# Patient Record
Sex: Male | Born: 1935
Health system: Southern US, Community
[De-identification: ages and names within clinical notes are randomized; demographics above are authoritative.]

## PROBLEM LIST (undated history)

## (undated) DIAGNOSIS — I10 Essential (primary) hypertension: Secondary | ICD-10-CM

## (undated) DIAGNOSIS — T8859XA Other complications of anesthesia, initial encounter: Secondary | ICD-10-CM

## (undated) DIAGNOSIS — E559 Vitamin D deficiency, unspecified: Secondary | ICD-10-CM

## (undated) DIAGNOSIS — M5136 Other intervertebral disc degeneration, lumbar region: Secondary | ICD-10-CM

## (undated) DIAGNOSIS — M65352 Trigger finger, left little finger: Secondary | ICD-10-CM

## (undated) DIAGNOSIS — I219 Acute myocardial infarction, unspecified: Secondary | ICD-10-CM

## (undated) DIAGNOSIS — M810 Age-related osteoporosis without current pathological fracture: Secondary | ICD-10-CM

## (undated) DIAGNOSIS — T7840XA Allergy, unspecified, initial encounter: Secondary | ICD-10-CM

## (undated) DIAGNOSIS — T4145XA Adverse effect of unspecified anesthetic, initial encounter: Secondary | ICD-10-CM

## (undated) DIAGNOSIS — M1990 Unspecified osteoarthritis, unspecified site: Secondary | ICD-10-CM

## (undated) DIAGNOSIS — E785 Hyperlipidemia, unspecified: Secondary | ICD-10-CM

## (undated) DIAGNOSIS — M51369 Other intervertebral disc degeneration, lumbar region without mention of lumbar back pain or lower extremity pain: Secondary | ICD-10-CM

## (undated) DIAGNOSIS — I2510 Atherosclerotic heart disease of native coronary artery without angina pectoris: Secondary | ICD-10-CM

## (undated) DIAGNOSIS — C801 Malignant (primary) neoplasm, unspecified: Secondary | ICD-10-CM

## (undated) DIAGNOSIS — R0600 Dyspnea, unspecified: Secondary | ICD-10-CM

## (undated) HISTORY — PX: TONGUE SURGERY: SHX810

## (undated) HISTORY — DX: Unspecified osteoarthritis, unspecified site: M19.90

## (undated) HISTORY — PX: COLONOSCOPY W/ POLYPECTOMY: SHX1380

## (undated) HISTORY — DX: Other intervertebral disc degeneration, lumbar region without mention of lumbar back pain or lower extremity pain: M51.369

## (undated) HISTORY — PX: CARDIAC CATHETERIZATION: SHX172

## (undated) HISTORY — DX: Vitamin D deficiency, unspecified: E55.9

## (undated) HISTORY — PX: CHOLECYSTECTOMY: SHX55

## (undated) HISTORY — PX: SKIN CANCER EXCISION: SHX779

## (undated) HISTORY — PX: OTHER SURGICAL HISTORY: SHX169

## (undated) HISTORY — PX: BICEPS TENDON REPAIR: SHX566

## (undated) HISTORY — PX: TONSILLECTOMY: SUR1361

## (undated) HISTORY — DX: Age-related osteoporosis without current pathological fracture: M81.0

## (undated) HISTORY — PX: LEG SURGERY: SHX1003

## (undated) HISTORY — PX: EYE SURGERY: SHX253

## (undated) HISTORY — PX: ROTATOR CUFF REPAIR: SHX139

## (undated) HISTORY — DX: Trigger finger, left little finger: M65.352

## (undated) HISTORY — DX: Other intervertebral disc degeneration, lumbar region: M51.36

## (undated) HISTORY — DX: Allergy, unspecified, initial encounter: T78.40XA

---

## 1982-02-27 DIAGNOSIS — I219 Acute myocardial infarction, unspecified: Secondary | ICD-10-CM

## 1982-02-27 HISTORY — DX: Acute myocardial infarction, unspecified: I21.9

## 1982-02-27 HISTORY — PX: CORONARY ANGIOPLASTY: SHX604

## 1997-02-27 HISTORY — PX: JOINT REPLACEMENT: SHX530

## 2003-04-23 ENCOUNTER — Encounter: Admission: RE | Admit: 2003-04-23 | Discharge: 2003-04-23 | Payer: Self-pay | Admitting: Orthopedic Surgery

## 2003-12-18 ENCOUNTER — Encounter: Admission: RE | Admit: 2003-12-18 | Discharge: 2003-12-18 | Payer: Self-pay | Admitting: Orthopedic Surgery

## 2003-12-22 ENCOUNTER — Ambulatory Visit (HOSPITAL_COMMUNITY): Admission: RE | Admit: 2003-12-22 | Discharge: 2003-12-22 | Payer: Self-pay | Admitting: Orthopedic Surgery

## 2003-12-22 ENCOUNTER — Ambulatory Visit (HOSPITAL_BASED_OUTPATIENT_CLINIC_OR_DEPARTMENT_OTHER): Admission: RE | Admit: 2003-12-22 | Discharge: 2003-12-22 | Payer: Self-pay | Admitting: Orthopedic Surgery

## 2009-11-17 ENCOUNTER — Ambulatory Visit (HOSPITAL_COMMUNITY): Admission: RE | Admit: 2009-11-17 | Discharge: 2009-11-18 | Payer: Self-pay | Admitting: Orthopedic Surgery

## 2010-05-12 LAB — DIFFERENTIAL
Basophils Absolute: 0 10*3/uL (ref 0.0–0.1)
Basophils Relative: 0 % (ref 0–1)
Eosinophils Absolute: 0.2 10*3/uL (ref 0.0–0.7)
Eosinophils Relative: 4 % (ref 0–5)
Lymphocytes Relative: 25 % (ref 12–46)
Lymphs Abs: 1.5 10*3/uL (ref 0.7–4.0)
Monocytes Absolute: 0.6 10*3/uL (ref 0.1–1.0)
Monocytes Relative: 11 % (ref 3–12)
Neutro Abs: 3.5 10*3/uL (ref 1.7–7.7)
Neutrophils Relative %: 60 % (ref 43–77)

## 2010-05-12 LAB — APTT: aPTT: 27 seconds (ref 24–37)

## 2010-05-12 LAB — CBC
HCT: 41.1 % (ref 39.0–52.0)
Hemoglobin: 14.5 g/dL (ref 13.0–17.0)
MCH: 33.1 pg (ref 26.0–34.0)
MCHC: 35.2 g/dL (ref 30.0–36.0)
MCV: 94 fL (ref 78.0–100.0)
Platelets: 170 10*3/uL (ref 150–400)
RBC: 4.38 MIL/uL (ref 4.22–5.81)
RDW: 12.7 % (ref 11.5–15.5)
WBC: 5.8 10*3/uL (ref 4.0–10.5)

## 2010-05-12 LAB — URINALYSIS, ROUTINE W REFLEX MICROSCOPIC
Bilirubin Urine: NEGATIVE
Glucose, UA: NEGATIVE mg/dL
Hgb urine dipstick: NEGATIVE
Ketones, ur: NEGATIVE mg/dL
Nitrite: NEGATIVE
Protein, ur: NEGATIVE mg/dL
Specific Gravity, Urine: 1.021 (ref 1.005–1.030)
Urobilinogen, UA: 1 mg/dL (ref 0.0–1.0)
pH: 5.5 (ref 5.0–8.0)

## 2010-05-12 LAB — COMPREHENSIVE METABOLIC PANEL
ALT: 24 U/L (ref 0–53)
AST: 28 U/L (ref 0–37)
Albumin: 3.9 g/dL (ref 3.5–5.2)
Alkaline Phosphatase: 59 U/L (ref 39–117)
BUN: 17 mg/dL (ref 6–23)
CO2: 29 mEq/L (ref 19–32)
Calcium: 9.4 mg/dL (ref 8.4–10.5)
Chloride: 107 mEq/L (ref 96–112)
Creatinine, Ser: 1.03 mg/dL (ref 0.4–1.5)
GFR calc Af Amer: >60 mL/min (ref >60)
GFR calc non Af Amer: >60 mL/min (ref >60)
Glucose, Bld: 101 mg/dL — ABNORMAL HIGH (ref 70–99)
Potassium: 4.8 mEq/L (ref 3.5–5.1)
Sodium: 142 mEq/L (ref 135–145)
Total Bilirubin: 1 mg/dL (ref 0.3–1.2)
Total Protein: 6.9 g/dL (ref 6.0–8.3)

## 2010-05-12 LAB — SURGICAL PCR SCREEN
MRSA, PCR: NEGATIVE
Staphylococcus aureus: NEGATIVE

## 2010-05-12 LAB — PROTIME-INR
INR: 0.94 (ref 0.00–1.49)
Prothrombin Time: 12.8 seconds (ref 11.6–15.2)

## 2010-07-15 NOTE — Op Note (Signed)
NAME:  Ricky Lee, Ricky Lee NO.:  [id number]   MEDICAL RECORD NO.:  [id number]          PATIENT TYPE:  AMB   LOCATION:  DSC                          FACILITY:  [facility]   PHYSICIAN:  Leonides Grills, M.D.     DATE OF BIRTH:  [birth year]   DATE OF PROCEDURE:  12/22/2003  DATE OF DISCHARGE:                                 OPERATIVE REPORT   PREOPERATIVE DIAGNOSES:  1.  Right talonavicular joint arthritis.  2.  Right subtalar joint arthritis.  3.  Right tight gastroc.  4.  Right os trigonum.  5.  Right posterior ankle impingement.  6.  Right tight peroneus brevis tendon.   POSTOPERATIVE DIAGNOSES:  1.  Right talonavicular joint arthritis.  2.  Right subtalar joint arthritis.  3.  Right tight gastroc.  4.  Right os trigonum.  5.  Right posterior ankle impingement.  6.  Right tight peroneus brevis tendon.   OPERATION PERFORMED:  1.  Right talonavicular joint fusion.  2.  Stress x-rays right foot.  3.  Right subtalar joint fusion.  4.  Right local bone graft.  5.  Right gastroc slide.  6.  Right partial excision, talus.  7.  Right posterior ankle arthrotomy with local tenosynovectomy.  8.  Right peroneus brevis tenotomy.   SURGEON:  Leonides Grills, M.D.   ASSISTANT:  Lianne Cure, P.A.   ANESTHESIA:  General endotracheal tube.   ESTIMATED BLOOD LOSS:  Minimal.   TOURNIQUET TIME:  Two hours.   COMPLICATIONS:  None.   DISPOSITION:  Stable to PR.   INDICATIONS FOR PROCEDURE:  The patient is a 77 year old male who has had  longstanding hind foot pain that was interfering with his life to the point  where he cannot do what he wants to do.  The patient  has consented for the  above procedure.  All risks which include infection, neurovascular injury,  nonunion,  malunion, hardware irritation, hardware failure, persistent pain,  worsening pain, stiffness, arthritis of the neighboring joints with possible  future fusion were all explained, questions were encouraged  and answered.   DESCRIPTION OF PROCEDURE:  The patient was brought to the operating room and  placed in supine position after adequate general endotracheal tube  anesthesia was administered with popliteal block as well as Ancef 1 g IV  piggy back.  The patient was then placed in a sloppy lateral position with  the operative side up on a bean bag.  All bony prominences were well padded.  The right lower extremity was prepped and draped in sterile manner over a  proximally placed thigh tourniquet.  The limb was gravity exsanguinated.  The tourniquet was elevated 290 mmHg.  Longitudinal incision midline between  the posterior aspect of the peroneal tendons as well as the anterior aspect  of the Achilles tendon was then made, dissection was carried down through  skin and hemostasis was obtained.  Sural nerve was then identified and a  formal sural nerve neurolysis was then performed.  Soft tissue was then  retracted out of harm's way, posterior capsulotomy was then performed.  There was  a large os trigonum posteriorly which was then removed with the  rongeur as well as the curved quarter inch osteotome.  Stress x-rays were  obtained in the lateral view for his plantar flexion show that this was  adequately decompressed and there was no impinging areas.  This was palpated  as well.  The wound was then copiously irrigated with normal saline.  Subcu  was closed with 3-0 Vicryl.  The skin was closed with 4-0 nylon.  An Ollier  incision was then made curvilinear over the sinus tarsi area.  Dissection  was carried down through skin.  Hemostasis was obtained.  The extensor  digitorum brevis was then removed in a U-shaped manner with a scalpel and  the subtalar joint in the lateral portion of talonavicular joint was then  entered.  The remaining cartilage was then removed with a curved 1/4 inch  osteotome, curette, as well as a synovectomy rongeur.  Multiple 2 mm drill  holes were placed on either  side of both joints as well.  We then corrected  the AP talar calcaneal angle by distracting through the lateral column in  the angle of Glissane with a medium-sized lamina spreader. This had not only  excellent correction of the lateral column shortening but also reduced the  talonavicular joint as well as reproduced his arch as well.  This was then  provisionally fixed with a 2.0 mm K-wire.  Through a stab incision plantarly  a K-wire was then placed.  However, prior to this we felt that the peroneus  brevis tendon was severely tight when doing this maneuver and we were able  to correct it further by doing a formal peroneus brevis tenotomy.  We  extended the dissection, exposed the peroneus brevis tendon and then  performed a formal tenotomy of the peroneus brevis tendon.  This had  excellent release of the right peroneus brevis tendon and we were able to  reduce the deformity even further.  We left the peroneus longus to help  plantar flex the first ray.  We then with the joint held in reduced  position, placed a K-wire across the subtalar joint and this held it in a  beautiful position.  Through a stab wound plantarly at the glabral skin  border 4.5, 3.2 mm drill  holes were then placed in the calcaneus and talus  respectively and 85 mm long 16 mm threaded 6.5 mm cancellous screw was then  placed.  This had excellent compression across the subtalar joint.  The  local bone graft was obtained through spur removal as well as the os  trigonum was then rongeured up into small fragments.  We then copiously  irrigated the area with normal saline and then placed local bone graft using  a bur stretch strain relieving type in the anterior aspect of the subtalar  joint in the sinus tarsi area as well as a portion of it went into the  lateral aspect of the talonavicular joint as well.  We then closed the  posterolateral approach to the posterior aspect of the ankle with 3-0 Vicryl an 4-0 nylon and  then closed the Ollier approach after it was copiously  irrigated with normal saline with 2-0 Vicryl to repair the extensor  digitorum brevis origin and then finally close the subcu with 3-0 Vicryl and  the skin with 4-0 nylon.  We then deflated the bean bag, externally rotated  the leg and then made a longitudinal incision over the medial head  of the  gastrocnemius, dissection was carried down through skin and hemostasis  obtained.  Fascia was opened in line with the incision. Conjoined region was  then developed between the gastroc and soleus.  Soft tissue was then  elevated off the posterior aspect of the gastrocnemius identifying the sural  nerve and protecting this posteriorly.  The gastroc then released with the  curved Mayo scissors.  This had excellent release of the tight gastroc.  The  wound was copiously irrigated with normal saline.  Subcutaneous was closed  with 3-0 Vicryl.  The skin was closed with 4-0 Monocryl subcuticular stitch.  A longitudinal incision was made over the dorsal aspect of the posterior  tibial tendon.  Dissection was carried down through skin and hemostasis was  obtained.  A capsulotomy was then performed involving the talonavicular  joint.  The joint was then entered.  The remaining cartilage was then  removed with a curved 1/4 inch osteotome, curette as well as synovectomy  rongeur.  We then plantar flexed the first ray and held this in a reduced  position with a two point reduction clamp.  We then placed a 6.5 mm 32 mm  threaded 50 mm long cancellous screw using a 4.5  3.2 mm drill holes  respectively.  This had excellent compression and maintenance of the  correction.  Clinically, the foot was in five degrees of hind foot valgus.  The first ray was plantar flexed adequately and this was palpated as well.  The area was copiously irrigated with normal saline. The tourniquet was  deflated, hemostasis was obtained.  The dorsal aspect of the posterior  tibial  tendon as well as capsule was closed with 2-0 FiberWire.  Subcu was  closed with 3-0 Vicryl.  Skin was closed with 4-0 nylon.  Final x-rays  obtained, AP ankle, lateral view of the hind foot and AP view of the hind  foot were obtained and showed excellent placement of fixation, adequate  correction of deformity and no gross motion with range of motion of the  ankle as well, stressing the ankle with stress x-rays.  Jones dressing was  applied with ankle in neutral dorsiflexion.  Cam walker boot was applied.  The patient was stable to the PR.       PB/MEDQ  D:  12/22/2003  T:  12/22/2003  Job:  [id number]

## 2011-02-09 DIAGNOSIS — C022 Malignant neoplasm of ventral surface of tongue: Secondary | ICD-10-CM | POA: Insufficient documentation

## 2012-01-08 ENCOUNTER — Encounter (HOSPITAL_COMMUNITY): Payer: Self-pay | Admitting: Pharmacy Technician

## 2012-01-09 NOTE — H&P (Signed)
CC: right shoulder pain HPI: 78   Y/o m    With worsening right shoulder pain secondary to osteoarthritis and rotator cuff arthropathy. Patient has elected to have a reverse total shoulder arthroplasty to decrease pain and increase function. PMH: Hx of MI in 58, hypertension, hyperlipidemia, skin cancer, rheumatoid Family History:coronary heart disease Social: retired, no ETOH, non smoker Meds: metoprolol, lisinopril, simvastatin, aspirin  Allergies: NKDA ROS: Pain with rom of right shoulder Vitals: 122/82, 14, 70 PE: Alert and appropriate    In no acute distress Cranial 2-12 grossly intact Cervical spine full rom without pain Lungs: bilateral breath sounds with no wheeze rhonchi or rales Heart: regular rate and rhythm with no murmur Abd: nontender nondistended with active bowel sounds Ext: right shoulder: FF 90 ER 10 IR 20 nv intact distally Skin: no rashes X-rays: right shoulder end stage osteoarthritis with rotator cuff tearing A/P: right shoulder rotator cuff arthropathy Plan for total shoulder arthroplasty reverse to decrease pain and increase function.

## 2012-01-10 NOTE — Pre-Procedure Instructions (Signed)
22 Ricky Lee  01/10/2012   Your procedure is scheduled on:  Friday, January 19, 2012  Report to [facility] at 5:30 AM.  Call this number if you have problems the morning of surgery: [phone]   Remember:   Do not eat food or drink :After Midnight.      Take these medicines the morning of surgery with A SIP OF WATER: metoprolol    Do not wear jewelry, make-up or nail polish.  Do not wear lotions, powders, or perfumes.   Do not shave 48 hours prior to surgery. Men may shave face and neck.  Do not bring valuables to the hospital.  Contacts, dentures or bridgework may not be worn into surgery.  Leave suitcase in the car. After surgery it may be brought to your room.  For patients admitted to the hospital, checkout time is 11:00 AM the day of discharge.   Patients discharged the day of surgery will not be allowed to drive home.  Name and phone number of your driver: family / friend  Special Instructions: Shower using CHG 2 nights before surgery and the night before surgery.  If you shower the day of surgery use CHG.  Use special wash - you have one bottle of CHG for all showers.  You should use approximately 1/3 of the bottle for each shower.   Please read over the following fact sheets that you were given: Pain Booklet, Coughing and Deep Breathing, MRSA Information and Surgical Site Infection Prevention

## 2012-01-11 ENCOUNTER — Encounter (HOSPITAL_COMMUNITY)
Admission: RE | Admit: 2012-01-11 | Discharge: 2012-01-11 | Disposition: A | Discharge status: Home / Self Care | Payer: Medicare Other | Source: Ambulatory Visit | Attending: Anesthesiology | Admitting: Anesthesiology

## 2012-01-11 ENCOUNTER — Encounter (HOSPITAL_COMMUNITY)
Admission: RE | Admit: 2012-01-11 | Discharge: 2012-01-11 | Disposition: A | Discharge status: Home / Self Care | Payer: Medicare Other | Source: Ambulatory Visit | Attending: Orthopedic Surgery | Admitting: Orthopedic Surgery

## 2012-01-11 ENCOUNTER — Encounter (HOSPITAL_COMMUNITY): Payer: Self-pay

## 2012-01-11 ENCOUNTER — Encounter (HOSPITAL_COMMUNITY): Admission: RE | Admit: 2012-01-11 | Payer: Medicare Other | Source: Ambulatory Visit

## 2012-01-11 HISTORY — DX: Acute myocardial infarction, unspecified: I21.9

## 2012-01-11 HISTORY — DX: Essential (primary) hypertension: I10

## 2012-01-11 HISTORY — DX: Malignant (primary) neoplasm, unspecified: C80.1

## 2012-01-11 HISTORY — DX: Unspecified osteoarthritis, unspecified site: M19.90

## 2012-01-11 HISTORY — DX: Hyperlipidemia, unspecified: E78.5

## 2012-01-11 LAB — BASIC METABOLIC PANEL
BUN: 18 mg/dL (ref 6–23)
Chloride: 103 mEq/L (ref 96–112)
GFR calc Af Amer: >90 mL/min (ref >90)
GFR calc non Af Amer: 79 mL/min — ABNORMAL LOW (ref >90)
Potassium: 4.6 mEq/L (ref 3.5–5.1)
Sodium: 141 mEq/L (ref 135–145)

## 2012-01-11 LAB — SURGICAL PCR SCREEN
MRSA, PCR: NEGATIVE
Staphylococcus aureus: NEGATIVE

## 2012-01-11 LAB — CBC
HCT: 45.5 % (ref 39.0–52.0)
Hemoglobin: 15.5 g/dL (ref 13.0–17.0)
MCHC: 34.1 g/dL (ref 30.0–36.0)
RDW: 12.9 % (ref 11.5–15.5)
WBC: 8.1 10*3/uL (ref 4.0–10.5)

## 2012-01-11 LAB — TYPE AND SCREEN
ABO/RH(D): A POS
Antibody Screen: NEGATIVE

## 2012-01-11 NOTE — Progress Notes (Signed)
Primary Physician - Dr. Samuel Germany; [place] Cardiologist - Dr. Laruth Bouchard - [place] EKG, Echo - 2 weeks ago  Stress test within last 5 years - will request records

## 2012-01-11 NOTE — Progress Notes (Signed)
01/11/12 0843  OBSTRUCTIVE SLEEP APNEA  Have you ever been diagnosed with sleep apnea through a sleep study? No (having sleep study 02/06/12)  Do you snore loudly (loud enough to be heard through closed doors)?  0  Do you often feel tired, fatigued, or sleepy during the daytime? 0  Has anyone observed you stop breathing during your sleep? 0  Do you have, or are you being treated for high blood pressure? 1  BMI more than 35 kg/m2? 1  Age over 76 years old? 1  Neck circumference greater than 40 cm/18 inches? 0  Gender: 1  Obstructive Sleep Apnea Score 4   Score 4 or greater  Results sent to PCP

## 2012-01-18 MED ORDER — DEXTROSE 5 % IV SOLN
3.0000 g | INTRAVENOUS | Status: DC
  Filled 2012-01-18: qty 3000

## 2012-01-18 NOTE — Consult Note (Signed)
Anesthesia Chart Review:  78 year old male scheduled for right reverse total should arthroplasty by Dr. Ranell Patrick.  History includes former smoker, HTN, HLD, OSA, MI, oral and skin cancer.  CXR on 01/11/12 showed low volumes and bibasilar atelectasis.  Preoperative labs noted.  Cardiologist Dr. Gypsy Balsam who cleared patient for this procedure with "mild" risk.  EKG on 12/06/11 showed NSR, LAD, borderline r wave progression in anterior leads.  Echo on 12/14/11 showed normal LVEF 62%, trace MR/TR.  Anticipate he can proceed if no significant change in his status.  Shonna Chock, PA-C

## 2012-01-19 ENCOUNTER — Inpatient Hospital Stay (HOSPITAL_COMMUNITY)
Admission: RE | Admit: 2012-01-19 | Discharge: 2012-01-20 | DRG: 484 | Disposition: A | Discharge status: Home / Self Care | Payer: Medicare Other | Source: Ambulatory Visit | Attending: Orthopedic Surgery | Admitting: Orthopedic Surgery

## 2012-01-19 ENCOUNTER — Inpatient Hospital Stay (HOSPITAL_COMMUNITY): Payer: Medicare Other

## 2012-01-19 ENCOUNTER — Encounter (HOSPITAL_COMMUNITY): Payer: Self-pay | Admitting: Vascular Surgery

## 2012-01-19 ENCOUNTER — Encounter (HOSPITAL_COMMUNITY)
Admission: RE | Disposition: A | Discharge status: Home / Self Care | Payer: Self-pay | Source: Ambulatory Visit | Attending: Orthopedic Surgery

## 2012-01-19 ENCOUNTER — Inpatient Hospital Stay (HOSPITAL_COMMUNITY): Payer: Medicare Other | Admitting: Vascular Surgery

## 2012-01-19 ENCOUNTER — Encounter (HOSPITAL_COMMUNITY): Payer: Self-pay | Admitting: *Deleted

## 2012-01-19 DIAGNOSIS — Z79899 Other long term (current) drug therapy: Secondary | ICD-10-CM

## 2012-01-19 DIAGNOSIS — Z01812 Encounter for preprocedural laboratory examination: Secondary | ICD-10-CM

## 2012-01-19 DIAGNOSIS — M19019 Primary osteoarthritis, unspecified shoulder: Principal | ICD-10-CM | POA: Diagnosis present

## 2012-01-19 DIAGNOSIS — Z85828 Personal history of other malignant neoplasm of skin: Secondary | ICD-10-CM

## 2012-01-19 DIAGNOSIS — G4730 Sleep apnea, unspecified: Secondary | ICD-10-CM | POA: Diagnosis present

## 2012-01-19 DIAGNOSIS — M67919 Unspecified disorder of synovium and tendon, unspecified shoulder: Secondary | ICD-10-CM | POA: Diagnosis present

## 2012-01-19 DIAGNOSIS — I252 Old myocardial infarction: Secondary | ICD-10-CM

## 2012-01-19 DIAGNOSIS — Z7982 Long term (current) use of aspirin: Secondary | ICD-10-CM

## 2012-01-19 DIAGNOSIS — I10 Essential (primary) hypertension: Secondary | ICD-10-CM | POA: Diagnosis present

## 2012-01-19 DIAGNOSIS — M719 Bursopathy, unspecified: Secondary | ICD-10-CM | POA: Diagnosis present

## 2012-01-19 DIAGNOSIS — E785 Hyperlipidemia, unspecified: Secondary | ICD-10-CM | POA: Diagnosis present

## 2012-01-19 DIAGNOSIS — Z01818 Encounter for other preprocedural examination: Secondary | ICD-10-CM

## 2012-01-19 HISTORY — PX: TOTAL SHOULDER ARTHROPLASTY: SHX126

## 2012-01-19 SURGERY — ARTHROPLASTY, SHOULDER, TOTAL
Anesthesia: Regional | Site: Shoulder | Laterality: Right | Wound class: Clean

## 2012-01-19 MED ORDER — SIMVASTATIN 40 MG PO TABS
40.0000 mg | ORAL_TABLET | Freq: Every day | ORAL | Status: DC
  Administered 2012-01-19: 40 mg via ORAL
  Filled 2012-01-19 (×2): qty 1

## 2012-01-19 MED ORDER — ACETAMINOPHEN 10 MG/ML IV SOLN
INTRAVENOUS | Status: AC
  Filled 2012-01-19: qty 100

## 2012-01-19 MED ORDER — METHOCARBAMOL 100 MG/ML IJ SOLN
500.0000 mg | INTRAVENOUS | Status: AC
  Administered 2012-01-19: 500 mg via INTRAVENOUS
  Filled 2012-01-19: qty 5

## 2012-01-19 MED ORDER — BUPIVACAINE-EPINEPHRINE PF 0.25-1:200000 % IJ SOLN
INTRAMUSCULAR | Status: AC
  Filled 2012-01-19: qty 30

## 2012-01-19 MED ORDER — PHENOL 1.4 % MT LIQD: 1.0000 | OROMUCOSAL | Status: DC | PRN

## 2012-01-19 MED ORDER — ACETAMINOPHEN 650 MG RE SUPP: 650.0000 mg | Freq: Four times a day (QID) | RECTAL | Status: DC | PRN

## 2012-01-19 MED ORDER — ACETAMINOPHEN 10 MG/ML IV SOLN: 1000.0000 mg | Freq: Once | INTRAVENOUS | Status: DC | PRN

## 2012-01-19 MED ORDER — CHLORHEXIDINE GLUCONATE 4 % EX LIQD: 60.0000 mL | Freq: Once | CUTANEOUS | Status: DC

## 2012-01-19 MED ORDER — ONDANSETRON HCL 4 MG/2ML IJ SOLN: 4.0000 mg | Freq: Once | INTRAMUSCULAR | Status: DC | PRN

## 2012-01-19 MED ORDER — ADULT MULTIVITAMIN W/MINERALS CH
1.0000 | ORAL_TABLET | Freq: Every day | ORAL | Status: DC
  Administered 2012-01-19: 1 via ORAL
  Filled 2012-01-19 (×2): qty 1

## 2012-01-19 MED ORDER — LIDOCAINE HCL (CARDIAC) 20 MG/ML IV SOLN
INTRAVENOUS | Status: DC | PRN
  Administered 2012-01-19: 30 mg via INTRAVENOUS

## 2012-01-19 MED ORDER — ROCURONIUM BROMIDE 100 MG/10ML IV SOLN
INTRAVENOUS | Status: DC | PRN
  Administered 2012-01-19: 50 mg via INTRAVENOUS

## 2012-01-19 MED ORDER — ACETAMINOPHEN 325 MG PO TABS: 650.0000 mg | ORAL_TABLET | Freq: Four times a day (QID) | ORAL | Status: DC | PRN

## 2012-01-19 MED ORDER — SODIUM CHLORIDE 0.9 % IR SOLN
Status: DC | PRN
  Administered 2012-01-19: 1000 mL

## 2012-01-19 MED ORDER — ACETAMINOPHEN 10 MG/ML IV SOLN
1000.0000 mg | Freq: Once | INTRAVENOUS | Status: AC
  Administered 2012-01-19: 1000 mg via INTRAVENOUS
  Filled 2012-01-19: qty 100

## 2012-01-19 MED ORDER — EPHEDRINE SULFATE 50 MG/ML IJ SOLN
INTRAMUSCULAR | Status: DC | PRN
  Administered 2012-01-19 (×2): 5 mg via INTRAVENOUS
  Administered 2012-01-19: 10 mg via INTRAVENOUS

## 2012-01-19 MED ORDER — OXYCODONE-ACETAMINOPHEN 5-325 MG PO TABS: 1.0000 | ORAL_TABLET | ORAL | Status: DC | PRN

## 2012-01-19 MED ORDER — METOPROLOL TARTRATE 25 MG PO TABS
25.0000 mg | ORAL_TABLET | Freq: Two times a day (BID) | ORAL | Status: DC
  Administered 2012-01-19: 25 mg via ORAL
  Filled 2012-01-19 (×4): qty 1

## 2012-01-19 MED ORDER — ONDANSETRON HCL 4 MG/2ML IJ SOLN: 4.0000 mg | Freq: Four times a day (QID) | INTRAMUSCULAR | Status: DC | PRN

## 2012-01-19 MED ORDER — LACTATED RINGERS IV SOLN
INTRAVENOUS | Status: DC | PRN
  Administered 2012-01-19 (×2): via INTRAVENOUS

## 2012-01-19 MED ORDER — CEFAZOLIN SODIUM-DEXTROSE 2-3 GM-% IV SOLR
2.0000 g | Freq: Four times a day (QID) | INTRAVENOUS | Status: AC
  Administered 2012-01-19 – 2012-01-20 (×3): 2 g via INTRAVENOUS
  Filled 2012-01-19 (×3): qty 50

## 2012-01-19 MED ORDER — METOCLOPRAMIDE HCL 10 MG PO TABS
5.0000 mg | ORAL_TABLET | Freq: Three times a day (TID) | ORAL | Status: DC | PRN
Start: 2012-01-19 — End: 2012-01-20

## 2012-01-19 MED ORDER — BISACODYL 10 MG RE SUPP: 10.0000 mg | Freq: Every day | RECTAL | Status: DC | PRN

## 2012-01-19 MED ORDER — DEXAMETHASONE SODIUM PHOSPHATE 4 MG/ML IJ SOLN
INTRAMUSCULAR | Status: DC | PRN
  Administered 2012-01-19: 4 mg

## 2012-01-19 MED ORDER — HYDROMORPHONE HCL PF 1 MG/ML IJ SOLN
INTRAMUSCULAR | Status: AC
  Filled 2012-01-19: qty 1

## 2012-01-19 MED ORDER — VITAMIN E 180 MG (400 UNIT) PO CAPS
400.0000 [IU] | ORAL_CAPSULE | Freq: Every day | ORAL | Status: DC
  Administered 2012-01-19: 400 [IU] via ORAL
  Filled 2012-01-19 (×3): qty 1

## 2012-01-19 MED ORDER — ACETAMINOPHEN 10 MG/ML IV SOLN: INTRAVENOUS | Status: DC | PRN

## 2012-01-19 MED ORDER — METHOCARBAMOL 100 MG/ML IJ SOLN
500.0000 mg | Freq: Four times a day (QID) | INTRAVENOUS | Status: DC | PRN
  Filled 2012-01-19: qty 5

## 2012-01-19 MED ORDER — KETOROLAC TROMETHAMINE 30 MG/ML IJ SOLN
INTRAMUSCULAR | Status: DC | PRN
  Administered 2012-01-19: 15 mg via INTRAVENOUS

## 2012-01-19 MED ORDER — OXYCODONE HCL 5 MG PO TABS
5.0000 mg | ORAL_TABLET | ORAL | Status: DC | PRN
  Administered 2012-01-20 (×2): 10 mg via ORAL
  Filled 2012-01-19 (×2): qty 2

## 2012-01-19 MED ORDER — METHOCARBAMOL 500 MG PO TABS
500.0000 mg | ORAL_TABLET | Freq: Four times a day (QID) | ORAL | Status: DC | PRN
  Administered 2012-01-20: 500 mg via ORAL
  Filled 2012-01-19: qty 1

## 2012-01-19 MED ORDER — BUPIVACAINE-EPINEPHRINE 0.25% -1:200000 IJ SOLN
INTRAMUSCULAR | Status: DC | PRN
  Administered 2012-01-19: 7 mL

## 2012-01-19 MED ORDER — COENZYME Q10 30 MG PO CAPS: 30.0000 mg | ORAL_CAPSULE | Freq: Every day | ORAL | Status: DC

## 2012-01-19 MED ORDER — METOCLOPRAMIDE HCL 5 MG/ML IJ SOLN: 5.0000 mg | Freq: Three times a day (TID) | INTRAMUSCULAR | Status: DC | PRN

## 2012-01-19 MED ORDER — METHOCARBAMOL 500 MG PO TABS: 500.0000 mg | ORAL_TABLET | Freq: Three times a day (TID) | ORAL | Status: DC | PRN

## 2012-01-19 MED ORDER — HYDROMORPHONE HCL PF 1 MG/ML IJ SOLN
0.2500 mg | INTRAMUSCULAR | Status: DC | PRN
  Administered 2012-01-19: 0.5 mg via INTRAVENOUS

## 2012-01-19 MED ORDER — BUPIVACAINE-EPINEPHRINE PF 0.5-1:200000 % IJ SOLN
INTRAMUSCULAR | Status: DC | PRN
  Administered 2012-01-19: 25 mL

## 2012-01-19 MED ORDER — MIDAZOLAM HCL 5 MG/5ML IJ SOLN
INTRAMUSCULAR | Status: DC | PRN
  Administered 2012-01-19: 2 mg via INTRAVENOUS

## 2012-01-19 MED ORDER — MENTHOL 3 MG MT LOZG: 1.0000 | LOZENGE | OROMUCOSAL | Status: DC | PRN

## 2012-01-19 MED ORDER — PROPOFOL 10 MG/ML IV BOLUS
INTRAVENOUS | Status: DC | PRN
  Administered 2012-01-19: 170 mg via INTRAVENOUS

## 2012-01-19 MED ORDER — THROMBIN 5000 UNITS EX SOLR
CUTANEOUS | Status: AC
  Filled 2012-01-19: qty 5000

## 2012-01-19 MED ORDER — ONDANSETRON HCL 4 MG/2ML IJ SOLN
INTRAMUSCULAR | Status: DC | PRN
  Administered 2012-01-19: 4 mg via INTRAVENOUS

## 2012-01-19 MED ORDER — POTASSIUM CHLORIDE IN NACL 20-0.9 MEQ/L-% IV SOLN
INTRAVENOUS | Status: DC
  Administered 2012-01-19: 18:00:00 via INTRAVENOUS
  Filled 2012-01-19 (×2): qty 1000

## 2012-01-19 MED ORDER — PHENYLEPHRINE HCL 10 MG/ML IJ SOLN
INTRAMUSCULAR | Status: DC | PRN
  Administered 2012-01-19 (×4): 80 ug via INTRAVENOUS

## 2012-01-19 MED ORDER — FENTANYL CITRATE 0.05 MG/ML IJ SOLN
INTRAMUSCULAR | Status: DC | PRN
  Administered 2012-01-19: 50 ug via INTRAVENOUS
  Administered 2012-01-19: 100 ug via INTRAVENOUS

## 2012-01-19 MED ORDER — OMEGA-3 FATTY ACIDS 1000 MG PO CAPS
2.0000 g | ORAL_CAPSULE | Freq: Two times a day (BID) | ORAL | Status: DC
  Administered 2012-01-19: 1 g via ORAL
  Administered 2012-01-19: 2 g via ORAL
  Filled 2012-01-19 (×4): qty 2

## 2012-01-19 MED ORDER — ONDANSETRON HCL 4 MG PO TABS: 4.0000 mg | ORAL_TABLET | Freq: Four times a day (QID) | ORAL | Status: DC | PRN

## 2012-01-19 MED ORDER — MORPHINE SULFATE 2 MG/ML IJ SOLN
2.0000 mg | INTRAMUSCULAR | Status: DC | PRN
  Administered 2012-01-19 – 2012-01-20 (×2): 2 mg via INTRAVENOUS
  Filled 2012-01-19 (×2): qty 1

## 2012-01-19 MED ORDER — ACETAMINOPHEN 10 MG/ML IV SOLN
1000.0000 mg | Freq: Four times a day (QID) | INTRAVENOUS | Status: DC
  Administered 2012-01-19 – 2012-01-20 (×3): 1000 mg via INTRAVENOUS
  Filled 2012-01-19 (×4): qty 100

## 2012-01-19 MED ORDER — GARLIC 1000 MG PO CAPS: 1.0000 | ORAL_CAPSULE | Freq: Every day | ORAL | Status: DC

## 2012-01-19 MED ORDER — LISINOPRIL 5 MG PO TABS
5.0000 mg | ORAL_TABLET | Freq: Every day | ORAL | Status: DC
  Administered 2012-01-19: 5 mg via ORAL
  Filled 2012-01-19 (×2): qty 1

## 2012-01-19 SURGICAL SUPPLY — 65 items
BLADE SAG 18X100X1.27 (BLADE) ×2 IMPLANT
BLADE SAW SAG 73X25 THK (BLADE)
BLADE SAW SGTL 73X25 THK (BLADE) IMPLANT
BUR SURG 4X8 MED (BURR) IMPLANT
BURR SURG 4X8 MED (BURR)
CLOTH BEACON ORANGE TIMEOUT ST (SAFETY) ×2 IMPLANT
CLSR STERI-STRIP ANTIMIC 1/2X4 (GAUZE/BANDAGES/DRESSINGS) ×2 IMPLANT
COVER SURGICAL LIGHT HANDLE (MISCELLANEOUS) ×2 IMPLANT
DRAPE INCISE IOBAN 66X45 STRL (DRAPES) ×2 IMPLANT
DRAPE U-SHAPE 47X51 STRL (DRAPES) ×2 IMPLANT
DRAPE X-RAY CASS 24X20 (DRAPES) IMPLANT
DRILL BIT 5/64 (BIT) ×2 IMPLANT
DRSG ADAPTIC 3X8 NADH LF (GAUZE/BANDAGES/DRESSINGS) ×2 IMPLANT
DRSG PAD ABDOMINAL 8X10 ST (GAUZE/BANDAGES/DRESSINGS) ×2 IMPLANT
DURAPREP 26ML APPLICATOR (WOUND CARE) ×2 IMPLANT
ELECT BLADE 4.0 EZ CLEAN MEGAD (MISCELLANEOUS) ×2
ELECT NEEDLE TIP 2.8 STRL (NEEDLE) ×2 IMPLANT
ELECT REM PT RETURN 9FT ADLT (ELECTROSURGICAL) ×2
ELECTRODE BLDE 4.0 EZ CLN MEGD (MISCELLANEOUS) ×1 IMPLANT
ELECTRODE REM PT RTRN 9FT ADLT (ELECTROSURGICAL) ×1 IMPLANT
GLOVE BIOGEL PI ORTHO PRO 7.5 (GLOVE) ×1
GLOVE BIOGEL PI ORTHO PRO SZ8 (GLOVE) ×1
GLOVE ORTHO TXT STRL SZ7.5 (GLOVE) ×2 IMPLANT
GLOVE PI ORTHO PRO STRL 7.5 (GLOVE) ×1 IMPLANT
GLOVE PI ORTHO PRO STRL SZ8 (GLOVE) ×1 IMPLANT
GLOVE SURG ORTHO 8.5 STRL (GLOVE) ×4 IMPLANT
GOWN STRL REIN XL XLG (GOWN DISPOSABLE) ×6 IMPLANT
HANDPIECE INTERPULSE COAX TIP (DISPOSABLE)
KIT BASIN OR (CUSTOM PROCEDURE TRAY) ×2 IMPLANT
KIT ROOM TURNOVER OR (KITS) ×2 IMPLANT
MANIFOLD NEPTUNE II (INSTRUMENTS) ×2 IMPLANT
NDL SUT 6 .5 CRC .975X.05 MAYO (NEEDLE) ×1 IMPLANT
NEEDLE 1/2 CIR MAYO (NEEDLE) ×2 IMPLANT
NEEDLE HYPO 25GX1X1/2 BEV (NEEDLE) ×2 IMPLANT
NEEDLE MAYO TAPER (NEEDLE) ×1
NS IRRIG 1000ML POUR BTL (IV SOLUTION) ×2 IMPLANT
PACK SHOULDER (CUSTOM PROCEDURE TRAY) ×2 IMPLANT
PAD ARMBOARD 7.5X6 YLW CONV (MISCELLANEOUS) ×2 IMPLANT
SCREW 48L (Screw) ×2 IMPLANT
SET HNDPC FAN SPRY TIP SCT (DISPOSABLE) IMPLANT
SLING ARM IMMOBILIZER LRG (SOFTGOODS) ×2 IMPLANT
SLING ARM IMMOBILIZER MED (SOFTGOODS) IMPLANT
SMARTMIX MINI TOWER (MISCELLANEOUS) ×2
SPACER 42 PLUS 6 (Orthopedic Implant) ×2 IMPLANT
SPONGE GAUZE 4X4 12PLY (GAUZE/BANDAGES/DRESSINGS) ×2 IMPLANT
SPONGE LAP 18X18 X RAY DECT (DISPOSABLE) ×2 IMPLANT
SPONGE LAP 4X18 X RAY DECT (DISPOSABLE) ×2 IMPLANT
SPONGE SURGIFOAM ABS GEL SZ50 (HEMOSTASIS) IMPLANT
STRIP CLOSURE SKIN 1/2X4 (GAUZE/BANDAGES/DRESSINGS) ×2 IMPLANT
SUCTION FRAZIER TIP 10 FR DISP (SUCTIONS) ×2 IMPLANT
SUT FIBERWIRE #2 38 T-5 BLUE (SUTURE) ×8
SUT MNCRL AB 4-0 PS2 18 (SUTURE) ×2 IMPLANT
SUT VIC AB 0 CT1 27 (SUTURE) ×1
SUT VIC AB 0 CT1 27XBRD ANBCTR (SUTURE) ×1 IMPLANT
SUT VIC AB 2-0 CT1 27 (SUTURE) ×1
SUT VIC AB 2-0 CT1 TAPERPNT 27 (SUTURE) ×1 IMPLANT
SUT VICRYL AB 2 0 TIES (SUTURE) ×2 IMPLANT
SUTURE FIBERWR #2 38 T-5 BLUE (SUTURE) ×4 IMPLANT
SYR CONTROL 10ML LL (SYRINGE) ×2 IMPLANT
TOWEL OR 17X24 6PK STRL BLUE (TOWEL DISPOSABLE) ×2 IMPLANT
TOWEL OR 17X26 10 PK STRL BLUE (TOWEL DISPOSABLE) ×2 IMPLANT
TOWER SMARTMIX MINI (MISCELLANEOUS) ×1 IMPLANT
TRAY FOLEY CATH 14FR (SET/KITS/TRAYS/PACK) ×2 IMPLANT
WATER STERILE IRR 1000ML POUR (IV SOLUTION) ×2 IMPLANT
YANKAUER SUCT BULB TIP NO VENT (SUCTIONS) ×2 IMPLANT

## 2012-01-19 NOTE — Transfer of Care (Signed)
Immediate Anesthesia Transfer of Care Note  Patient: Ricky Lee  Procedure(s) Performed: Procedure(s) (LRB) with comments: TOTAL SHOULDER ARTHROPLASTY (Right) - REVERSE TOTAL SHOULDER  Patient Location: PACU  Anesthesia Type:GA combined with regional for post-op pain  Level of Consciousness: awake, alert , oriented and patient cooperative  Airway & Oxygen Therapy: Patient Spontanous Breathing and Patient connected to face mask oxygen  Post-op Assessment: Report given to PACU RN, Post -op Vital signs reviewed and stable and Patient moving all extremities  Post vital signs: Reviewed and stable  Complications: No apparent anesthesia complications

## 2012-01-19 NOTE — Plan of Care (Signed)
Problem: Consults Goal: Rotator Cuff Repair Patient Education See Patient Education Module for education specifics. Right total Shoulder

## 2012-01-19 NOTE — Progress Notes (Signed)
Report given to kathy rn as caregiver 

## 2012-01-19 NOTE — Anesthesia Preprocedure Evaluation (Addendum)
Anesthesia Evaluation  Patient identified by MRN, date of birth, ID band Patient awake    Reviewed: Allergy & Precautions, H&P , NPO status , Patient's Chart, lab work & pertinent test results, reviewed documented beta blocker date and time   Airway Mallampati: I TM Distance: >3 FB Neck ROM: Full    Dental  (+) Edentulous Upper and Dental Advisory Given   Pulmonary sleep apnea ,  breath sounds clear to auscultation        Cardiovascular hypertension, Pt. on home beta blockers + Past MI Rhythm:Regular Rate:Normal     Neuro/Psych    GI/Hepatic   Endo/Other    Renal/GU      Musculoskeletal   Abdominal   Peds  Hematology   Anesthesia Other Findings   Reproductive/Obstetrics                         Anesthesia Physical Anesthesia Plan  ASA: III  Anesthesia Plan: General   Post-op Pain Management:    Induction: Intravenous  Airway Management Planned: Oral ETT  Additional Equipment:   Intra-op Plan:   Post-operative Plan: Extubation in OR  Informed Consent: I have reviewed the patients History and Physical, chart, labs and discussed the procedure including the risks, benefits and alternatives for the proposed anesthesia with the patient or authorized representative who has indicated his/her understanding and acceptance.   Dental advisory given  Plan Discussed with: CRNA and Surgeon  Anesthesia Plan Comments:         Anesthesia Quick Evaluation

## 2012-01-19 NOTE — Brief Op Note (Signed)
01/19/2012  10:38 AM  PATIENT:  Barbara Cower Triggs  78 y.o. male  PRE-OPERATIVE DIAGNOSIS:  RIGHT SHOULDER END STAGE OA WITH CUFF ARTHROPATHY  POST-OPERATIVE DIAGNOSIS:  RIGHT SHOULDER END STAGE OA WITH CUFF ARTHROPATHY  PROCEDURE:  Procedure(s) (LRB) with comments: TOTAL SHOULDER ARTHROPLASTY (Right) - REVERSE TOTAL SHOULDER, DePuy Delta Xtend  SURGEON:  Surgeon(s) and Role:    * Verlee Rossetti, MD - Primary  PHYSICIAN ASSISTANT:   ASSISTANTS: Thea Gist, PA-C   ANESTHESIA:   regional and general  EBL:  Total I/O In: 1000 [I.V.:1000] Out: -   BLOOD ADMINISTERED:none  DRAINS: none   LOCAL MEDICATIONS USED:  MARCAINE     SPECIMEN:  No Specimen  DISPOSITION OF SPECIMEN:  N/A  COUNTS:  YES  TOURNIQUET:  * No tourniquets in log *  DICTATION: .Other Dictation: Dictation Number [id number]  PLAN OF CARE: Admit to inpatient  PATIENT DISPOSITION:  PACU - hemodynamically stable.   Delay start of Pharmacological VTE agent (>24hrs) due to surgical blood loss or risk of bleeding: not applicable

## 2012-01-19 NOTE — Progress Notes (Signed)
Right shoulder xray done as ordered.

## 2012-01-19 NOTE — Progress Notes (Signed)
Pt refused to wear CPAP. HR was elevated 125 but said that he was just kind of anxious and that it has been a little high since he has been in the hospital. RR was 20. SPO2 was 94% on 2LPM oxygen. Breath sounds were slightly diminished. RT will continue to monitor pt.

## 2012-01-19 NOTE — Progress Notes (Signed)
UR COMPLETED  

## 2012-01-19 NOTE — Anesthesia Postprocedure Evaluation (Signed)
  Anesthesia Post-op Note  Patient: Ricky Lee  Procedure(s) Performed: Procedure(s) (LRB) with comments: TOTAL SHOULDER ARTHROPLASTY (Right) - REVERSE TOTAL SHOULDER  Patient Location: PACU  Anesthesia Type:General and GA combined with regional for post-op pain  Level of Consciousness: awake, alert  and oriented  Airway and Oxygen Therapy: Patient Spontanous Breathing and Patient connected to nasal cannula oxygen  Post-op Pain: mild  Post-op Assessment: Post-op Vital signs reviewed and Patient's Cardiovascular Status Stable  Post-op Vital Signs: stable  Complications: No apparent anesthesia complications

## 2012-01-19 NOTE — Progress Notes (Signed)
Report given to Ricky Lee for lunch break

## 2012-01-19 NOTE — Anesthesia Procedure Notes (Addendum)
Anesthesia Regional Block:  Interscalene brachial plexus block  Pre-Anesthetic Checklist: ,, timeout performed, Correct Patient, Correct Site, Correct Laterality, Correct Procedure, Correct Position, site marked, Risks and benefits discussed,  Surgical consent,  Pre-op evaluation,  At surgeon's request and post-op pain management  Laterality: Right  Prep: chloraprep       Needles:  Injection technique: Single-shot  Needle Type: Echogenic Stimulator Needle     Needle Length: 5cm 5 cm Needle Gauge: 22 and 22 G    Additional Needles:  Procedures: nerve stimulator Interscalene brachial plexus block  Nerve Stimulator or Paresthesia:  Response: 0.48 mA,   Additional Responses:   Narrative:  Start time: 01/19/2012 7:21 AM End time: 01/19/2012 7:30 AM Injection made incrementally with aspirations every 5 mL. Anesthesiologist: Dr Gypsy Balsam  Additional Notes: [id number] R ISB POP CHG prep, sterile tech #22 stim/echo needle w/stim down to .48ma Multiple neg asp Marc .5% w/epi 25cc+ decadron 4mg  infiltrated No compl Dr Gypsy Balsam   Procedure Name: Intubation Date/Time: 01/19/2012 7:46 AM Performed by: Jerilee Hoh Pre-anesthesia Checklist: Patient identified, Emergency Drugs available, Suction available and Patient being monitored Patient Re-evaluated:Patient Re-evaluated prior to inductionOxygen Delivery Method: Circle system utilized Preoxygenation: Pre-oxygenation with 100% oxygen Intubation Type: IV induction Ventilation: Mask ventilation without difficulty and Oral airway inserted - appropriate to patient size Laryngoscope Size: Mac and 4 Grade View: Grade I Tube type: Oral Tube size: 7.5 mm Number of attempts: 1 Airway Equipment and Method: Stylet Placement Confirmation: ETT inserted through vocal cords under direct vision,  positive ETCO2 and breath sounds checked- equal and bilateral Secured at: 21 cm Tube secured with: Tape Dental Injury: Teeth and Oropharynx as  per pre-operative assessment

## 2012-01-19 NOTE — Preoperative (Signed)
Beta Blockers   Reason not to administer Beta Blockers:Not Applicable 

## 2012-01-19 NOTE — Interval H&P Note (Signed)
History and Physical Interval Note:  01/19/2012 7:26 AM  Ricky Lee  has presented today for surgery, with the diagnosis of RIGHT SHOULDER END STAGE OA WITH CUFF ARTHROPATHY  The various methods of treatment have been discussed with the patient and family. After consideration of risks, benefits and other options for treatment, the patient has consented to  Procedure(s) (LRB) with comments: TOTAL SHOULDER ARTHROPLASTY (Right) - REVERSE TOTAL SHOULDER as a surgical intervention .  The patient's history has been reviewed, patient examined, no change in status, stable for surgery.  I have reviewed the patient's chart and labs.  Questions were answered to the patient's satisfaction.     Benny Henrie,STEVEN R

## 2012-01-19 NOTE — Progress Notes (Signed)
Report given from Nonah Mattes

## 2012-01-20 LAB — HEMOGLOBIN AND HEMATOCRIT, BLOOD
HCT: 38.9 % — ABNORMAL LOW (ref 39.0–52.0)
Hemoglobin: 13.2 g/dL (ref 13.0–17.0)

## 2012-01-20 LAB — BASIC METABOLIC PANEL
Calcium: 9 mg/dL (ref 8.4–10.5)
Creatinine, Ser: 0.87 mg/dL (ref 0.50–1.35)
GFR calc Af Amer: >90 mL/min (ref >90)
Sodium: 140 mEq/L (ref 135–145)

## 2012-01-20 NOTE — Evaluation (Signed)
Occupational Therapy Evaluation Patient Details Name: Ricky Lee MRN: [id number] DOB: 1933-10-07 Today's Date: 01/20/2012 Time: 4098-1191 OT Time Calculation (min): 16 min  OT Assessment / Plan / Recommendation Clinical Impression  This 78 y.o. male admitted for reverse TSA.  All education provided.  Pt. and wife demonstrate understanding of all.  Pt ready for discharge    OT Assessment  Patient does not need any further OT services    Follow Up Recommendations  No OT follow up    Barriers to Discharge      Equipment Recommendations  None recommended by OT    Recommendations for Other Services    Frequency       Precautions / Restrictions Precautions Precautions: Shoulder Type of Shoulder Precautions: Dr. Ranell Patrick reverse TSA Precaution Booklet Issued: Yes (comment) Precaution Comments: shoulder handout provided with modifications consistent with Dr. Ranell Patrick protoccol Required Braces or Orthoses: Other Brace/Splint (sling) Restrictions Weight Bearing Restrictions: Yes RUE Weight Bearing: Non weight bearing       ADL  Eating/Feeding: Set up Where Assessed - Eating/Feeding: Chair Grooming: Wash/dry hands;Wash/dry face;Teeth care;Brushing hair;Minimal assistance Where Assessed - Grooming: Supported standing Upper Body Bathing: Minimal assistance Where Assessed - Upper Body Bathing: Supported sitting;Supported standing Lower Body Bathing: Minimal assistance Where Assessed - Lower Body Bathing: Supported sit to stand Upper Body Dressing: Moderate assistance Where Assessed - Upper Body Dressing: Unsupported sitting;Unsupported standing Lower Body Dressing: Moderate assistance Where Assessed - Lower Body Dressing: Supported sit to Pharmacist, hospital: Modified independent Statistician Method: Sit to Barista: Comfort height toilet Toileting - Clothing Manipulation and Hygiene: Minimal assistance Where Assessed - Glass blower/designer  Manipulation and Hygiene: Standing Transfers/Ambulation Related to ADLs: Ambulates independently ADL Comments: Pt. and wife were instructed in safe techniques for ADLs.  He was instructed to wear sling for comfort, and wife able to safely assist with donning/doffing.  Pt. instructed in proper positioning Rt. UE when sleeping, and to incorporate it into ADL activities, but avoid lifting, pulling, pushing with it.  Do not push up with it when moving supine to sit and vise versa, and avoid abduction.  Pt and wife verbalized understanding of all    OT Diagnosis:    OT Problem List:   OT Treatment Interventions:     OT Goals    Visit Information  Last OT Received On: 01/20/12    Subjective Data  Subjective: "What's the difference between a total shoulder and a reverse shoulder replacement?" Patient Stated Goal: To go home   Prior Functioning     Home Living Lives With: Spouse Available Help at Discharge: Family;Available 24 hours/day Type of Home: House Bathroom Shower/Tub: Health visitor: Standard Additional Comments: Wife supportive and able to assist him as needed Prior Function Level of Independence: Independent Able to Take Stairs?: Yes Driving: Yes Vocation: Retired         Data processing manager: Within Hotel manager  Overall Cognitive Status: Appears within functional limits for tasks assessed/performed Arousal/Alertness: Awake/alert Orientation Level: Appears intact for tasks assessed Behavior During Session: [facility] for tasks performed    Extremity/Trunk Assessment Right Upper Extremity Assessment RUE ROM/Strength/Tone: Deficits;Due to precautions (and due to reverse TSA) RUE ROM/Strength/Tone Deficits: demonstrates ~90% Active shoulder flexion Left Upper Extremity Assessment LUE ROM/Strength/Tone: Within functional levels LUE Coordination: WFL - gross/fine motor Trunk Assessment Trunk Assessment: Normal       Mobility Transfers Transfers: Sit to Stand;Stand to Sit Sit to Stand: 7: Independent Stand  to Sit: 7: Independent     Shoulder Instructions Donning/doffing shirt without moving shoulder: Caregiver independent with task;Patient able to independently direct caregiver Method for sponge bathing under operated UE: Caregiver independent with task;Patient able to independently direct caregiver Donning/doffing sling/immobilizer: Caregiver independent with task;Patient able to independently direct caregiver Correct positioning of sling/immobilizer: Caregiver independent with task;Patient able to independently direct caregiver ROM for elbow, wrist and digits of operated UE: Supervision/safety Sling wearing schedule (on at all times/off for ADL's): Independent (sling for comfort) Proper positioning of operated UE when showering: Caregiver independent with task;Patient able to independently direct caregiver;Modified independent Positioning of UE while sleeping: Caregiver independent with task;Independent   Exercise Shoulder Exercises Elbow Flexion: AROM;Right;10 reps;Standing Elbow Extension: AROM;10 reps;Right;Seated Wrist Flexion: Right;AROM;10 reps;Standing Wrist Extension: AROM;Right;10 reps;Seated Neck Flexion: AROM;5 reps;Standing Neck Extension: AROM;10 reps;Standing   Balance     End of Session OT - End of Session Activity Tolerance: Patient tolerated treatment well Patient left: in chair;with call bell/phone within reach;with family/visitor present Nurse Communication: Other (comment) (No further OT needs)  GO     Ajna Moors M 01/20/2012, 2:02 PM

## 2012-01-20 NOTE — Discharge Summary (Signed)
Physician Discharge Summary  Patient ID: Nyshaun Standage Mallette MRN: [id number] DOB/AGE: 03-Oct-1933 78 y.o.  Admit date: 01/19/2012 Discharge date: 01/20/2012  Admission Diagnoses:  Right shoulder arthritis  Discharge Diagnoses:  Same   Surgeries: Procedure(s): TOTAL SHOULDER ARTHROPLASTY on 01/19/2012   Consultants: OT  Discharged Condition: Stable  Hospital Course: JOSEH SJOGREN is an 78 y.o. male who was admitted 01/19/2012 with a chief complaint of shoulder pain, and found to have a diagnosis of shoulder OA.  They were brought to the operating room on 01/19/2012 and underwent the above named procedures.    The patient had an uncomplicated hospital course and was stable for discharge.  Recent vital signs:  Filed Vitals:   01/20/12 0552  BP: 111/65  Pulse: 64  Temp: 98.1 F (36.7 C)  Resp: 16    Recent laboratory studies:  Results for orders placed during the hospital encounter of 01/19/12  HEMOGLOBIN AND HEMATOCRIT, BLOOD      Component Value Range   Hemoglobin 13.2  13.0 - 17.0 g/dL   HCT 40.9 (*) 81.1 - 91.4 %    Discharge Medications:     Medication List     As of 01/20/2012  8:02 AM    TAKE these medications         acetaminophen 325 MG tablet   Commonly known as: TYLENOL   Take 650 mg by mouth every 6 (six) hours as needed.      co-enzyme Q-10 30 MG capsule   Take 30 mg by mouth daily. Hold while in hospital      fish oil-omega-3 fatty acids 1000 MG capsule   Take 2 g by mouth 2 (two) times daily.      Garlic 1000 MG Caps   Take 1 capsule by mouth daily.      lisinopril 5 MG tablet   Commonly known as: PRINIVIL,ZESTRIL   Take 5 mg by mouth daily.      LYCOPENE PO   Take 1 tablet by mouth daily.      methocarbamol 500 MG tablet   Commonly known as: ROBAXIN   Take 1 tablet (500 mg total) by mouth 3 (three) times daily as needed.      metoprolol tartrate 25 MG tablet   Commonly known as: LOPRESSOR   Take 25 mg by mouth 2 (two) times  daily.      multivitamin with minerals Tabs   Take 1 tablet by mouth daily.      oxyCODONE-acetaminophen 5-325 MG per tablet   Commonly known as: PERCOCET/ROXICET   Take 1-2 tablets by mouth every 4 (four) hours as needed for pain.      simvastatin 40 MG tablet   Commonly known as: ZOCOR   Take 40 mg by mouth at bedtime.      vitamin E 400 UNIT capsule   Take 400 Units by mouth daily.        Diagnostic Studies: Dg Chest 2 View  01/11/2012  *RADIOLOGY REPORT*  Clinical Data: Preop  CHEST - 2 VIEW  Comparison: 06/28/2010  Findings: Low volumes.  Bibasilar subsegmental atelectasis.   No consolidation or mass.  Normal heart size.  No pleural effusion. Degenerative changes in the thoracic spine.  IMPRESSION: Low volumes and bibasilar atelectasis.   Original Report Authenticated By: Jolaine Click, M.D.    Dg Shoulder Right Port  01/19/2012  *RADIOLOGY REPORT*  Clinical Data: Shoulder surgery  PORTABLE RIGHT SHOULDER - 2+ VIEW  Comparison: None.  Findings: Right shoulder replacement  in satisfactory position and alignment.  No fracture or acute abnormality  IMPRESSION: Right shoulder replacement.   Original Report Authenticated By: Janeece Riggers, M.D.     Disposition: home with home exercises        Follow-up Information    Follow up with Lauraine Crespo,STEVEN R, MD. Call in 2 weeks. [phone])    Contact information:   [address], [address] [place] [address] [phone]           Signed: Sandro Burgo,STEVEN R 01/20/2012, 8:02 AM

## 2012-01-20 NOTE — Progress Notes (Signed)
D/C instructions reviewed with patient and wife. RX x 2 given. No hh services or equipment needed. Sling in place. dsg changed prior to d/c. All questions answered. Pt d/c'ed via wheelchair in stable condition

## 2012-01-20 NOTE — Op Note (Signed)
NAMEELMAR, ANTIGUA NO.:  [id number]  MEDICAL RECORD NO.:  [id number]  LOCATION:  [id number]                        FACILITY:  [facility]  PHYSICIAN:  Almedia Balls. Ranell Patrick, M.D. DATE OF BIRTH:  03/25/1933  DATE OF PROCEDURE:  01/19/2012 DATE OF DISCHARGE:                              OPERATIVE REPORT   PREOPERATIVE DIAGNOSIS:  Right shoulder end-stage osteoarthritis and rotator cuff insufficiency.  POSTOPERATIVE DIAGNOSIS:  Right shoulder end-stage osteoarthritis and rotator cuff insufficiency.  PROCEDURE PERFORMED:  Right shoulder reverse total shoulder arthroplasty using DePuy Delta Xtend prosthesis.  ATTENDING SURGEON:  Almedia Balls. Ranell Patrick, M.D.  ASSISTANT:  Donnie Coffin. Dixon, PA-C, who scrubbed during the entire procedure and necessary for satisfactory completion of surgery.  ANESTHESIA:  General anesthesia was used plus interscalene block.  ESTIMATED BLOOD LOSS:  Minimal.  ESTIMATED BLOOD LOSS:  100 mL.  FLUID REPLACEMENT:  1500 mL of crystalloid.  INSTRUMENT COUNTS:  Correct.  COMPLICATIONS:  There were no complications.  ANTIBIOTICS:  Perioperative antibiotics were given.  INDICATIONS:  The patient is a 78 year old male with end-stage arthritis of the right shoulder.  The patient had a rotator cuff repair done in [place] 2 years ago.  The patient has had persistent pain since that surgery.  The patient presents now for painful shoulder with very poor function and evidence of rotator cuff atrophy on his MRI and advanced arthritis.  Having failed conservative management, the patient desires reverse total shoulder arthroplasty to provide pain relief and improved function.  Informed consent obtained.  DESCRIPTION OF PROCEDURE:  After an adequate level of anesthesia was achieved, the patient was positioned in the modified beach-chair position.  The right shoulder was correctly identified.  Time-out called.  Sterile prep and drape, the shoulder and arm  performed.  We entered the shoulder using deltopectoral incision, started the anterior coracoid process extending into the anterior humeral shaft.  Dissection was down through the subcutaneous tissues using Bovie.  We identified the cephalic vein and took laterally the deltoid, pectoralis was taken medially.  Conjoined tendon identified and retracted.  Subscapularis released off the lesser tuberosity and tagged for repair.  At the end, we removed the capsule anteriorly, released the capsule off the inferior humeral neck and progressively externally rotated the humerus.  We then released the repaired rotator cuff, which did appear to be intact, but the quality of the tendon was now very good.  We released that all the way around to the back including teres minor.  We then entered the proximal humerus using a 6-mm reamer.  We then reamed up to a size 12 canal diameter and placed our 12 intramedullary resection guide for the proximal humeral resection.  We placed the wound and resected that with that intramedullary guide set on 10 degrees of retroversion, and then did our reaming from the metaphysis with the appropriate epi size 2 reamer so as an epi to right shoulder.  Once we had that metaphysis reamed, we took our implant which was a 12 body or 12 stem with the size 2 metaphyseal implant that was impacted in place.  We then retracted the humerus posteriorly, did a 360-degree capsular removal protecting the axillary nerve to  fully visualize the glenoid face.  We placed our central guidepin, referencing off of multiple things including the coracoid base, the anterior neck of the scapula, also the inferior scapular pillar.  We then went ahead and reamed for metaglene and drilled our central guide hole and then impacted the real metaglene into position.  We placed two lock screws inferiorly and superiorly, 42 inferiorly, 30 at the base of the coracoid, and then 18 anterior and posterior.   Good purchase with those screws.  Excess bone removed.  We then placed a 42 standard glenosphere in place and screwed that into position.  Next, we went ahead and trialed with a +6 trial, 42+ 6 and reduced the shoulder, we were happy with soft tissue balancing, removed the trial components, thoroughly irrigated the humerus, placed couple of drill holes around the bicipital groove.  We tenodesed the biceps.  We then went ahead and placed #2 FiberWire suture through those drill holes to repair subscap.  Next, we went ahead and irrigated and then using impaction grafting technique, we impacted the real hydroxyapatite-coated size 12 stem with the epi to right and set on 10 degrees of retroversion.  Impacted that into place, happy with the purchase and trialed again with +6 initially and thinking +6 was the appropriate size.  We selected that real poly and impacted that into the real humeral insert and then reduced the shoulder.  Upon further trialing, I noted there to be a little bit of gapping with external rotation, and I could dislocate the shoulder just to a little bit too easily, so I went ahead at that point as per the size 9, removed the +6 and took the 42+ 9.  Impacted that in position and I had tightened the shoulder up nicely.  Axillary nerve was not under too much tension.  Conjoint was tensioned appropriately.  We ranged his shoulder fully, no instability. Thoroughly irrigated, closed deltopectoral interval with 0 Vicryl suture followed by 2-0 Vicryl in the subcutaneous closure and 4-0 Monocryl for skin.  Steri-Strips applied followed by sterile dressing.  The patient tolerated the surgery well.     Almedia Balls. Ranell Patrick, M.D.     SRN/MEDQ  D:  01/19/2012  T:  01/20/2012  Job:  [id number]

## 2012-01-20 NOTE — Progress Notes (Signed)
Orthopedics Progress Note  Subjective: I feel better this morning.  Objective:  Filed Vitals:   01/20/12 0552  BP: 111/65  Pulse: 64  Temp: 98.1 F (36.7 C)  Resp: 16    General: Awake and alert  Musculoskeletal: Right shoulder clean and dry and intact, moving the shoulder well with minimal pain. Neurovascularly intact  Lab Results  Component Value Date   WBC 8.1 01/11/2012   HGB 15.5 01/11/2012   HCT 45.5 01/11/2012   MCV 93.8 01/11/2012   PLT 176 01/11/2012       Component Value Date/Time   NA 141 01/11/2012 0823   K 4.6 01/11/2012 0823   CL 103 01/11/2012 0823   CO2 29 01/11/2012 0823   GLUCOSE 104* 01/11/2012 0823   BUN 18 01/11/2012 0823   CREATININE 0.95 01/11/2012 0823   CALCIUM 9.5 01/11/2012 0823   GFRNONAA 79* 01/11/2012 0823   GFRAA >90 01/11/2012 0823    Lab Results  Component Value Date   INR 0.94 11/10/2009    Assessment/Plan: POD #1 s/p Procedure(s): TOTAL SHOULDER ARTHROPLASTY Stable overnight. Plan D/C today after therapy.  Will not need home health PT or OT.  Home exercises only, thanks! Limit WB on the right UE. Almedia Balls. Ranell Patrick, MD 01/20/2012 7:58 AM

## 2012-01-22 ENCOUNTER — Encounter (HOSPITAL_COMMUNITY): Payer: Self-pay | Admitting: Orthopedic Surgery

## 2012-01-24 NOTE — Addendum Note (Signed)
Addendum  created 01/24/12 1027 by Alda Gaultney N Mumm, CRNA   Modules edited:Anesthesia Medication Administration    

## 2012-01-24 NOTE — Addendum Note (Signed)
Addendum  created 01/24/12 1027 by Jerilee Hoh, CRNA   Modules edited:Anesthesia Medication Administration

## 2012-02-08 ENCOUNTER — Encounter (HOSPITAL_COMMUNITY): Payer: Self-pay | Admitting: *Deleted

## 2012-02-08 ENCOUNTER — Encounter (HOSPITAL_COMMUNITY): Payer: Self-pay | Admitting: Respiratory Therapy

## 2012-02-08 ENCOUNTER — Other Ambulatory Visit: Payer: Self-pay | Admitting: Physician Assistant

## 2012-02-09 ENCOUNTER — Inpatient Hospital Stay (HOSPITAL_COMMUNITY): Payer: Medicare Other

## 2012-02-09 ENCOUNTER — Ambulatory Visit (HOSPITAL_COMMUNITY): Payer: Medicare Other | Admitting: Anesthesiology

## 2012-02-09 ENCOUNTER — Encounter (HOSPITAL_COMMUNITY)
Admission: RE | Disposition: A | Discharge status: Home / Self Care | Payer: Self-pay | Source: Ambulatory Visit | Attending: Orthopedic Surgery

## 2012-02-09 ENCOUNTER — Inpatient Hospital Stay (HOSPITAL_COMMUNITY)
Admission: RE | Admit: 2012-02-09 | Discharge: 2012-02-11 | DRG: 508 | Disposition: A | Discharge status: Home / Self Care | Payer: Medicare Other | Source: Ambulatory Visit | Attending: Orthopedic Surgery | Admitting: Orthopedic Surgery

## 2012-02-09 ENCOUNTER — Encounter (HOSPITAL_COMMUNITY): Payer: Self-pay | Admitting: Certified Registered Nurse Anesthetist

## 2012-02-09 ENCOUNTER — Encounter (HOSPITAL_COMMUNITY): Payer: Self-pay | Admitting: *Deleted

## 2012-02-09 ENCOUNTER — Encounter (HOSPITAL_COMMUNITY): Payer: Self-pay | Admitting: Anesthesiology

## 2012-02-09 DIAGNOSIS — I252 Old myocardial infarction: Secondary | ICD-10-CM

## 2012-02-09 DIAGNOSIS — Z87891 Personal history of nicotine dependence: Secondary | ICD-10-CM

## 2012-02-09 DIAGNOSIS — T8450XA Infection and inflammatory reaction due to unspecified internal joint prosthesis, initial encounter: Principal | ICD-10-CM | POA: Diagnosis present

## 2012-02-09 DIAGNOSIS — Z96619 Presence of unspecified artificial shoulder joint: Secondary | ICD-10-CM

## 2012-02-09 DIAGNOSIS — I10 Essential (primary) hypertension: Secondary | ICD-10-CM | POA: Diagnosis present

## 2012-02-09 DIAGNOSIS — E785 Hyperlipidemia, unspecified: Secondary | ICD-10-CM | POA: Diagnosis present

## 2012-02-09 DIAGNOSIS — M25519 Pain in unspecified shoulder: Secondary | ICD-10-CM

## 2012-02-09 DIAGNOSIS — Z7982 Long term (current) use of aspirin: Secondary | ICD-10-CM

## 2012-02-09 DIAGNOSIS — Z79899 Other long term (current) drug therapy: Secondary | ICD-10-CM

## 2012-02-09 DIAGNOSIS — Y849 Medical procedure, unspecified as the cause of abnormal reaction of the patient, or of later complication, without mention of misadventure at the time of the procedure: Secondary | ICD-10-CM | POA: Diagnosis present

## 2012-02-09 HISTORY — PX: IRRIGATION AND DEBRIDEMENT SHOULDER: SHX5880

## 2012-02-09 LAB — BASIC METABOLIC PANEL
CO2: 27 mEq/L (ref 19–32)
Chloride: 105 mEq/L (ref 96–112)
GFR calc Af Amer: >90 mL/min (ref >90)
Potassium: 4.2 mEq/L (ref 3.5–5.1)
Sodium: 142 mEq/L (ref 135–145)

## 2012-02-09 LAB — CBC WITH DIFFERENTIAL/PLATELET
Eosinophils Relative: 2 % (ref 0–5)
HCT: 41.7 % (ref 39.0–52.0)
Hemoglobin: 13.5 g/dL (ref 13.0–17.0)
Lymphocytes Relative: 27 % (ref 12–46)
Lymphs Abs: 1.8 10*3/uL (ref 0.7–4.0)
MCV: 95.2 fL (ref 78.0–100.0)
Monocytes Absolute: 0.8 10*3/uL (ref 0.1–1.0)
Neutro Abs: 4.1 10*3/uL (ref 1.7–7.7)
RBC: 4.38 MIL/uL (ref 4.22–5.81)
WBC: 6.9 10*3/uL (ref 4.0–10.5)

## 2012-02-09 LAB — SURGICAL PCR SCREEN: Staphylococcus aureus: NEGATIVE

## 2012-02-09 SURGERY — IRRIGATION AND DEBRIDEMENT SHOULDER
Anesthesia: General | Site: Shoulder | Laterality: Right | Wound class: Dirty or Infected

## 2012-02-09 MED ORDER — SIMVASTATIN 40 MG PO TABS
40.0000 mg | ORAL_TABLET | Freq: Every day | ORAL | Status: DC
  Administered 2012-02-10: 40 mg via ORAL
  Filled 2012-02-09 (×2): qty 1

## 2012-02-09 MED ORDER — PROPOFOL 10 MG/ML IV BOLUS
INTRAVENOUS | Status: DC | PRN
  Administered 2012-02-09: 200 mg via INTRAVENOUS

## 2012-02-09 MED ORDER — MUPIROCIN 2 % EX OINT
TOPICAL_OINTMENT | Freq: Two times a day (BID) | CUTANEOUS | Status: DC
  Administered 2012-02-09: 1 via NASAL

## 2012-02-09 MED ORDER — GLYCOPYRROLATE 0.2 MG/ML IJ SOLN
INTRAMUSCULAR | Status: DC | PRN
  Administered 2012-02-09: .6 mg via INTRAVENOUS

## 2012-02-09 MED ORDER — ONDANSETRON HCL 4 MG/2ML IJ SOLN
INTRAMUSCULAR | Status: DC | PRN
  Administered 2012-02-09: 4 mg via INTRAVENOUS

## 2012-02-09 MED ORDER — MENTHOL 3 MG MT LOZG: 1.0000 | LOZENGE | OROMUCOSAL | Status: DC | PRN

## 2012-02-09 MED ORDER — ONDANSETRON HCL 4 MG PO TABS: 4.0000 mg | ORAL_TABLET | Freq: Four times a day (QID) | ORAL | Status: DC | PRN

## 2012-02-09 MED ORDER — PHENYLEPHRINE HCL 10 MG/ML IJ SOLN
INTRAMUSCULAR | Status: DC | PRN
  Administered 2012-02-09 (×2): 80 ug via INTRAVENOUS
  Administered 2012-02-09: 40 ug via INTRAVENOUS
  Administered 2012-02-09: 80 ug via INTRAVENOUS

## 2012-02-09 MED ORDER — SODIUM CHLORIDE 0.9 % IV SOLN
INTRAVENOUS | Status: DC
  Administered 2012-02-10: 03:00:00 via INTRAVENOUS

## 2012-02-09 MED ORDER — METOCLOPRAMIDE HCL 5 MG/ML IJ SOLN: 5.0000 mg | Freq: Three times a day (TID) | INTRAMUSCULAR | Status: DC | PRN

## 2012-02-09 MED ORDER — MIDAZOLAM HCL 5 MG/5ML IJ SOLN
INTRAMUSCULAR | Status: DC | PRN
  Administered 2012-02-09: 2 mg via INTRAVENOUS

## 2012-02-09 MED ORDER — ASPIRIN 325 MG PO TABS
325.0000 mg | ORAL_TABLET | Freq: Every day | ORAL | Status: DC
  Administered 2012-02-10 – 2012-02-11 (×2): 325 mg via ORAL
  Filled 2012-02-09 (×2): qty 1

## 2012-02-09 MED ORDER — VANCOMYCIN HCL IN DEXTROSE 1-5 GM/200ML-% IV SOLN
INTRAVENOUS | Status: AC
  Filled 2012-02-09: qty 400

## 2012-02-09 MED ORDER — OXYCODONE HCL 5 MG PO TABS
ORAL_TABLET | ORAL | Status: AC
  Filled 2012-02-09: qty 2

## 2012-02-09 MED ORDER — PHENOL 1.4 % MT LIQD: 1.0000 | OROMUCOSAL | Status: DC | PRN

## 2012-02-09 MED ORDER — NEOSTIGMINE METHYLSULFATE 1 MG/ML IJ SOLN
INTRAMUSCULAR | Status: DC | PRN
  Administered 2012-02-09: 5 mg via INTRAVENOUS

## 2012-02-09 MED ORDER — ACETAMINOPHEN 650 MG RE SUPP: 650.0000 mg | Freq: Four times a day (QID) | RECTAL | Status: DC | PRN

## 2012-02-09 MED ORDER — OXYCODONE HCL 5 MG PO TABS
5.0000 mg | ORAL_TABLET | ORAL | Status: DC | PRN
  Administered 2012-02-09 – 2012-02-11 (×5): 10 mg via ORAL
  Filled 2012-02-09 (×4): qty 2

## 2012-02-09 MED ORDER — MORPHINE SULFATE 2 MG/ML IJ SOLN: 2.0000 mg | INTRAMUSCULAR | Status: DC | PRN

## 2012-02-09 MED ORDER — HYDROMORPHONE HCL PF 1 MG/ML IJ SOLN
0.2500 mg | INTRAMUSCULAR | Status: DC | PRN
  Administered 2012-02-09: 0.5 mg via INTRAVENOUS

## 2012-02-09 MED ORDER — METOPROLOL TARTRATE 25 MG PO TABS
25.0000 mg | ORAL_TABLET | Freq: Two times a day (BID) | ORAL | Status: DC
  Administered 2012-02-09 – 2012-02-11 (×4): 25 mg via ORAL
  Filled 2012-02-09 (×5): qty 1

## 2012-02-09 MED ORDER — ROCURONIUM BROMIDE 100 MG/10ML IV SOLN
INTRAVENOUS | Status: DC | PRN
  Administered 2012-02-09: 50 mg via INTRAVENOUS

## 2012-02-09 MED ORDER — METOCLOPRAMIDE HCL 10 MG PO TABS: 5.0000 mg | ORAL_TABLET | Freq: Three times a day (TID) | ORAL | Status: DC | PRN

## 2012-02-09 MED ORDER — VANCOMYCIN HCL 10 G IV SOLR
1500.0000 mg | Freq: Once | INTRAVENOUS | Status: AC
  Administered 2012-02-09: 1500 mg via INTRAVENOUS
  Filled 2012-02-09: qty 1500

## 2012-02-09 MED ORDER — SODIUM CHLORIDE 0.9 % IR SOLN
Status: DC | PRN
  Administered 2012-02-09: 18:00:00

## 2012-02-09 MED ORDER — LISINOPRIL 5 MG PO TABS
5.0000 mg | ORAL_TABLET | Freq: Every day | ORAL | Status: DC
  Administered 2012-02-09 – 2012-02-11 (×3): 5 mg via ORAL
  Filled 2012-02-09 (×3): qty 1

## 2012-02-09 MED ORDER — VANCOMYCIN HCL IN DEXTROSE 1-5 GM/200ML-% IV SOLN
1000.0000 mg | Freq: Two times a day (BID) | INTRAVENOUS | Status: AC
  Administered 2012-02-10: 1000 mg via INTRAVENOUS
  Filled 2012-02-09: qty 200

## 2012-02-09 MED ORDER — HYDROMORPHONE HCL PF 1 MG/ML IJ SOLN
INTRAMUSCULAR | Status: AC
  Administered 2012-02-09: 1 mg
  Filled 2012-02-09: qty 1

## 2012-02-09 MED ORDER — OXYCODONE-ACETAMINOPHEN 5-325 MG PO TABS
1.0000 | ORAL_TABLET | ORAL | Status: DC | PRN
Start: 2012-02-09 — End: 2012-02-11

## 2012-02-09 MED ORDER — DEXAMETHASONE SODIUM PHOSPHATE 4 MG/ML IJ SOLN
INTRAMUSCULAR | Status: DC | PRN
  Administered 2012-02-09: 8 mg via INTRAVENOUS

## 2012-02-09 MED ORDER — LACTATED RINGERS IV SOLN
INTRAVENOUS | Status: DC
  Administered 2012-02-09: 16:00:00 via INTRAVENOUS

## 2012-02-09 MED ORDER — METOCLOPRAMIDE HCL 5 MG/ML IJ SOLN: 10.0000 mg | Freq: Once | INTRAMUSCULAR | Status: DC | PRN

## 2012-02-09 MED ORDER — SODIUM CHLORIDE 0.9 % IR SOLN
Status: DC | PRN
  Administered 2012-02-09 (×2): 3000 mL

## 2012-02-09 MED ORDER — FENTANYL CITRATE 0.05 MG/ML IJ SOLN
INTRAMUSCULAR | Status: DC | PRN
  Administered 2012-02-09: 100 ug via INTRAVENOUS
  Administered 2012-02-09 (×3): 50 ug via INTRAVENOUS

## 2012-02-09 MED ORDER — CHLORHEXIDINE GLUCONATE 4 % EX LIQD: 60.0000 mL | Freq: Once | CUTANEOUS | Status: DC

## 2012-02-09 MED ORDER — LACTATED RINGERS IV SOLN
INTRAVENOUS | Status: DC | PRN
  Administered 2012-02-09 (×2): via INTRAVENOUS

## 2012-02-09 MED ORDER — METHOCARBAMOL 500 MG PO TABS: 500.0000 mg | ORAL_TABLET | Freq: Three times a day (TID) | ORAL | Status: DC | PRN

## 2012-02-09 MED ORDER — OXYCODONE HCL 5 MG/5ML PO SOLN: 5.0000 mg | Freq: Once | ORAL | Status: DC | PRN

## 2012-02-09 MED ORDER — VITAMIN E 180 MG (400 UNIT) PO CAPS
400.0000 [IU] | ORAL_CAPSULE | Freq: Every day | ORAL | Status: DC
  Filled 2012-02-09 (×2): qty 1

## 2012-02-09 MED ORDER — OXYCODONE HCL 5 MG PO TABS: 5.0000 mg | ORAL_TABLET | Freq: Once | ORAL | Status: DC | PRN

## 2012-02-09 MED ORDER — ONDANSETRON HCL 4 MG/2ML IJ SOLN: 4.0000 mg | Freq: Four times a day (QID) | INTRAMUSCULAR | Status: DC | PRN

## 2012-02-09 MED ORDER — ACETAMINOPHEN 325 MG PO TABS: 650.0000 mg | ORAL_TABLET | Freq: Four times a day (QID) | ORAL | Status: DC | PRN

## 2012-02-09 MED ORDER — ADULT MULTIVITAMIN W/MINERALS CH
1.0000 | ORAL_TABLET | Freq: Every day | ORAL | Status: DC
  Filled 2012-02-09 (×2): qty 1

## 2012-02-09 MED ORDER — LIDOCAINE HCL (CARDIAC) 20 MG/ML IV SOLN
INTRAVENOUS | Status: DC | PRN
  Administered 2012-02-09: 100 mg via INTRAVENOUS

## 2012-02-09 SURGICAL SUPPLY — 54 items
CLOTH BEACON ORANGE TIMEOUT ST (SAFETY) ×2 IMPLANT
COVER SURGICAL LIGHT HANDLE (MISCELLANEOUS) ×2 IMPLANT
CUP STAND PE 42 PLUS 9MM (Orthopedic Implant) ×2 IMPLANT
CUP STD PE 42 PLUS 9MM (Orthopedic Implant) ×1 IMPLANT
DRAPE INCISE IOBAN 66X45 STRL (DRAPES) ×2 IMPLANT
DRAPE U-SHAPE 47X51 STRL (DRAPES) ×2 IMPLANT
DRSG PAD ABDOMINAL 8X10 ST (GAUZE/BANDAGES/DRESSINGS) ×4 IMPLANT
DURAPREP 26ML APPLICATOR (WOUND CARE) ×2 IMPLANT
ELECT REM PT RETURN 9FT ADLT (ELECTROSURGICAL)
ELECTRODE REM PT RTRN 9FT ADLT (ELECTROSURGICAL) IMPLANT
EVACUATOR 1/8 PVC DRAIN (DRAIN) IMPLANT
GLOVE BIOGEL PI ORTHO PRO 7.5 (GLOVE) ×1
GLOVE BIOGEL PI ORTHO PRO SZ8 (GLOVE) ×1
GLOVE ORTHO TXT STRL SZ7.5 (GLOVE) ×4 IMPLANT
GLOVE PI ORTHO PRO STRL 7.5 (GLOVE) ×1 IMPLANT
GLOVE PI ORTHO PRO STRL SZ8 (GLOVE) ×1 IMPLANT
GLOVE SURG ORTHO 8.5 STRL (GLOVE) ×4 IMPLANT
GLOVE SURG SS PI 7.0 STRL IVOR (GLOVE) ×4 IMPLANT
GLOVE SURG SS PI 7.5 STRL IVOR (GLOVE) ×2 IMPLANT
GLOVE SURG SS PI 8.5 STRL IVOR (GLOVE) ×1
GLOVE SURG SS PI 8.5 STRL STRW (GLOVE) ×1 IMPLANT
GOWN STRL NON-REIN LRG LVL3 (GOWN DISPOSABLE) ×2 IMPLANT
GOWN STRL REIN XL XLG (GOWN DISPOSABLE) ×4 IMPLANT
HANDPIECE INTERPULSE COAX TIP (DISPOSABLE) ×1
KIT BASIN OR (CUSTOM PROCEDURE TRAY) ×2 IMPLANT
KIT ROOM TURNOVER OR (KITS) ×2 IMPLANT
MANIFOLD NEPTUNE II (INSTRUMENTS) ×2 IMPLANT
NS IRRIG 1000ML POUR BTL (IV SOLUTION) IMPLANT
PACK SHOULDER (CUSTOM PROCEDURE TRAY) ×2 IMPLANT
PAD ARMBOARD 7.5X6 YLW CONV (MISCELLANEOUS) ×4 IMPLANT
SET HNDPC FAN SPRY TIP SCT (DISPOSABLE) ×1 IMPLANT
SLING ARM FOAM STRAP LRG (SOFTGOODS) ×2 IMPLANT
SPONGE GAUZE 4X4 12PLY (GAUZE/BANDAGES/DRESSINGS) ×2 IMPLANT
SPONGE LAP 18X18 X RAY DECT (DISPOSABLE) ×2 IMPLANT
SUT ETHILON 2 0 FS 18 (SUTURE) ×4 IMPLANT
SUT FIBERWIRE #2 38 T-5 BLUE (SUTURE)
SUT MNCRL AB 3-0 PS2 18 (SUTURE) ×2 IMPLANT
SUT PDS AB 1 CT  36 (SUTURE) ×1
SUT PDS AB 1 CT 36 (SUTURE) ×1 IMPLANT
SUT VIC AB 0 CT1 27 (SUTURE) ×1
SUT VIC AB 0 CT1 27XBRD ANBCTR (SUTURE) ×1 IMPLANT
SUT VIC AB 2-0 CT1 27 (SUTURE) ×1
SUT VIC AB 2-0 CT1 TAPERPNT 27 (SUTURE) ×1 IMPLANT
SUTURE FIBERWR #2 38 T-5 BLUE (SUTURE) IMPLANT
SWAB COLLECTION DEVICE MRSA (MISCELLANEOUS) ×2 IMPLANT
SWAB CULTURE LIQUID MINI MALE (MISCELLANEOUS) ×4 IMPLANT
TAPE CLOTH SURG 4X10 WHT LF (GAUZE/BANDAGES/DRESSINGS) ×2 IMPLANT
TOWEL OR 17X24 6PK STRL BLUE (TOWEL DISPOSABLE) ×2 IMPLANT
TOWEL OR 17X26 10 PK STRL BLUE (TOWEL DISPOSABLE) ×2 IMPLANT
TUBE ANAEROBIC SPECIMEN COL (MISCELLANEOUS) ×4 IMPLANT
TUBE CONNECTING 12X1/4 (SUCTIONS) ×2 IMPLANT
UNDERPAD 30X30 INCONTINENT (UNDERPADS AND DIAPERS) ×2 IMPLANT
WATER STERILE IRR 1000ML POUR (IV SOLUTION) ×2 IMPLANT
YANKAUER SUCT BULB TIP NO VENT (SUCTIONS) ×2 IMPLANT

## 2012-02-09 NOTE — Anesthesia Postprocedure Evaluation (Signed)
  Anesthesia Post-op Note  Patient: Ricky Lee  Procedure(s) Performed: Procedure(s) (LRB) with comments: IRRIGATION AND DEBRIDEMENT SHOULDER (Right) - Right Shoulder I&D with Poly Exchange, Removal of 42 +9 Standard Poly spacer  Patient Location: PACU  Anesthesia Type:General  Level of Consciousness: awake  Airway and Oxygen Therapy: Patient Spontanous Breathing  Post-op Pain: mild  Post-op Assessment: Post-op Vital signs reviewed  Post-op Vital Signs: Reviewed  Complications: No apparent anesthesia complications

## 2012-02-09 NOTE — Progress Notes (Signed)
Iv Team notified but no nurse is available tonight to insert picc line. Pt made high priority on list tomorrow due to possible d/c.

## 2012-02-09 NOTE — Brief Op Note (Signed)
02/09/2012  6:44 PM  PATIENT:  Ricky Lee  78 y.o. male  PRE-OPERATIVE DIAGNOSIS:  right shoulder infection  POST-OPERATIVE DIAGNOSIS:  Right Shoulder Infection suspected, superficial to fascia  PROCEDURE:  Procedure(s) (LRB) with comments: IRRIGATION AND DEBRIDEMENT SHOULDER (Right) - Right Shoulder I&D with Poly Exchange  SURGEON:  Surgeon(s) and Role:    * Verlee Rossetti, MD - Primary  PHYSICIAN ASSISTANT:   ASSISTANTS: Thea Gist, PA-C   ANESTHESIA:   general  EBL:  Total I/O In: 1000 [I.V.:1000] Out: -   BLOOD ADMINISTERED:none  DRAINS: none   LOCAL MEDICATIONS USED:  NONE  SPECIMEN:  Superficial shoulder fluid #1, deep shoulder fluid #2  DISPOSITION OF SPECIMEN:  micro  COUNTS:  YES  TOURNIQUET:  * No tourniquets in log *  DICTATION: .Other Dictation: Dictation Number [id number]  PLAN OF CARE: Admit to inpatient   PATIENT DISPOSITION:  PACU - hemodynamically stable.   Delay start of Pharmacological VTE agent (>24hrs) due to surgical blood loss or risk of bleeding: not applicable

## 2012-02-09 NOTE — Transfer of Care (Signed)
Immediate Anesthesia Transfer of Care Note  Patient: Ricky Lee  Procedure(s) Performed: Procedure(s) (LRB) with comments: IRRIGATION AND DEBRIDEMENT SHOULDER (Right) - Right Shoulder I&D with Poly Exchange, Removal of 42 +9 Standard Poly spacer  Patient Location: PACU  Anesthesia Type:General  Level of Consciousness: awake, alert  and oriented  Airway & Oxygen Therapy: Patient Spontanous Breathing and Patient connected to face mask oxygen  Post-op Assessment: Report given to PACU RN, Post -op Vital signs reviewed and stable and Patient moving all extremities X 4  Post vital signs: Reviewed and stable  Complications: No apparent anesthesia complications

## 2012-02-09 NOTE — Preoperative (Signed)
Beta Blockers   Reason not to administer Beta Blockers:Not Applicable, Took am BB--Metoprolol 25 mg 0400

## 2012-02-09 NOTE — Interval H&P Note (Signed)
History and Physical Interval Note:  02/09/2012 4:47 PM  Ricky Lee  has presented today for surgery, with the diagnosis of right shoulder infection  The various methods of treatment have been discussed with the patient and family. After consideration of risks, benefits and other options for treatment, the patient has consented to  Procedure(s) (LRB) with comments: IRRIGATION AND DEBRIDEMENT SHOULDER (Right) - Right Shoulder I&D with Poly Exchange as a surgical intervention .  The patient's history has been reviewed, patient examined, no change in status, stable for surgery.  I have reviewed the patient's chart and labs.  Questions were answered to the patient's satisfaction.     Jemal Miskell,STEVEN R

## 2012-02-09 NOTE — Anesthesia Preprocedure Evaluation (Signed)
Anesthesia Evaluation  Patient identified by MRN, date of birth, ID band Patient awake    Reviewed: Allergy & Precautions, H&P , NPO status , Patient's Chart, lab work & pertinent test results, reviewed documented beta blocker date and time   Airway Mallampati: II TM Distance: >3 FB Neck ROM: full    Dental   Pulmonary sleep apnea ,  breath sounds clear to auscultation        Cardiovascular hypertension, On Medications and On Home Beta Blockers + Past MI Rhythm:regular     Neuro/Psych negative neurological ROS  negative psych ROS   GI/Hepatic negative GI ROS, Neg liver ROS,   Endo/Other  negative endocrine ROS  Renal/GU negative Renal ROS  negative genitourinary   Musculoskeletal   Abdominal   Peds  Hematology negative hematology ROS (+)   Anesthesia Other Findings See surgeon's H&P   Reproductive/Obstetrics negative OB ROS                           Anesthesia Physical Anesthesia Plan  ASA: III  Anesthesia Plan: General   Post-op Pain Management:    Induction: Intravenous  Airway Management Planned: Oral ETT  Additional Equipment:   Intra-op Plan:   Post-operative Plan: Extubation in OR  Informed Consent: I have reviewed the patients History and Physical, chart, labs and discussed the procedure including the risks, benefits and alternatives for the proposed anesthesia with the patient or authorized representative who has indicated his/her understanding and acceptance.   Dental Advisory Given  Plan Discussed with: CRNA and Surgeon  Anesthesia Plan Comments:         Anesthesia Quick Evaluation

## 2012-02-09 NOTE — H&P (Signed)
Ricky Lee is an 78 y.o. male.   Chief Complaint: Right shoulder infection HPI: 78 yo male s/p right shoulder reverse total shoulder one month ago with greater than one week history of erythema right anterior shoulder concerning for cellulitis versus deep infection.  Patient did not respond clinically to a one week course of po antibiotics, keflex. Admitted for I+D and polyethylene exchange.  Past Medical History  Diagnosis Date  . Myocardial infarction 1984  . Hypertension   . Sleep apnea     possible - having sleep study 02/06/12  . Cancer     oral cancer in past, skin cancer  . Arthritis   . Hyperlipemia     Past Surgical History  Procedure Date  . Rotator cuff repair     right  . Tongue surgery     removal of cancer  . Joint replacement     right  . Leg surgery     right and leg  . Biceps tendon repair     left  . Cholecystectomy   . Cardiac catheterization   . Right foot surgery   . Tonsillectomy   . Coronary angioplasty 1984  . Total shoulder arthroplasty 01/19/2012    Procedure: TOTAL SHOULDER ARTHROPLASTY;  Surgeon: Verlee Rossetti, MD;  Location: [facility] OR;  Service: Orthopedics;  Laterality: Right;  REVERSE TOTAL SHOULDER    History reviewed. No pertinent family history. Social History:  reports that he has quit smoking. He does not have any smokeless tobacco history on file. He reports that he drinks alcohol. He reports that he does not use illicit drugs.  Allergies:  Allergies  Allergen Reactions  . Coumadin (Warfarin)     Medications Prior to Admission  Medication Sig Dispense Refill  . aspirin 325 MG tablet Take 325 mg by mouth daily.      Marland Kitchen co-enzyme Q-10 30 MG capsule Take 30 mg by mouth daily. Hold while in hospital      . fish oil-omega-3 fatty acids 1000 MG capsule Take 2 g by mouth 2 (two) times daily.      . Garlic 1000 MG CAPS Take 1 capsule by mouth daily.      Marland Kitchen lisinopril (PRINIVIL,ZESTRIL) 5 MG tablet Take 5 mg by mouth daily.      Marland Kitchen  LYCOPENE PO Take 1 tablet by mouth daily.      . metoprolol tartrate (LOPRESSOR) 25 MG tablet Take 25 mg by mouth 2 (two) times daily.      . Multiple Vitamin (MULTIVITAMIN WITH MINERALS) TABS Take 1 tablet by mouth daily.      . naproxen sodium (ANAPROX) 220 MG tablet Take 440 mg by mouth 2 (two) times daily as needed. For pain      . simvastatin (ZOCOR) 40 MG tablet Take 40 mg by mouth at bedtime.      . vitamin E 400 UNIT capsule Take 400 Units by mouth daily.        Results for orders placed during the hospital encounter of 02/09/12 (from the past 48 hour(s))  BASIC METABOLIC PANEL     Status: Abnormal   Collection Time   02/09/12  3:00 PM      Component Value Range Comment   Sodium 142  135 - 145 mEq/L    Potassium 4.2  3.5 - 5.1 mEq/L    Chloride 105  96 - 112 mEq/L    CO2 27  19 - 32 mEq/L    Glucose, Bld 106 (*)  70 - 99 mg/dL    BUN 18  6 - 23 mg/dL    Creatinine, Ser 4.09  0.50 - 1.35 mg/dL    Calcium 9.5  8.4 - 81.1 mg/dL    GFR calc non Af Amer 83 (*) >90 mL/min    GFR calc Af Amer >90  >90 mL/min   CBC WITH DIFFERENTIAL     Status: Normal   Collection Time   02/09/12  3:00 PM      Component Value Range Comment   WBC 6.9  4.0 - 10.5 K/uL    RBC 4.38  4.22 - 5.81 MIL/uL    Hemoglobin 13.5  13.0 - 17.0 g/dL    HCT 91.4  78.2 - 95.6 %    MCV 95.2  78.0 - 100.0 fL    MCH 30.8  26.0 - 34.0 pg    MCHC 32.4  30.0 - 36.0 g/dL    RDW 21.3  08.6 - 57.8 %    Platelets 228  150 - 400 K/uL    Neutrophils Relative 60  43 - 77 %    Neutro Abs 4.1  1.7 - 7.7 K/uL    Lymphocytes Relative 27  12 - 46 %    Lymphs Abs 1.8  0.7 - 4.0 K/uL    Monocytes Relative 12  3 - 12 %    Monocytes Absolute 0.8  0.1 - 1.0 K/uL    Eosinophils Relative 2  0 - 5 %    Eosinophils Absolute 0.1  0.0 - 0.7 K/uL    Basophils Relative 0  0 - 1 %    Basophils Absolute 0.0  0.0 - 0.1 K/uL    No results found.  ROS  Blood pressure 144/92, pulse 104, temperature 98 F (36.7 C), temperature source  Oral, resp. rate 16, height 5\' 8"  (1.727 m), weight 127.007 kg (280 lb), SpO2 97.00%. Physical Exam right shoulder with redness and no swelling or fluctuence.  Distally neuro intact in the right arm. Heart regular, chest normal excursion, bilateral LEs and left UE have normal pain free ROM  Assessment/Plan Right shoulder infection in the setting of a reverse total shoulder replacement. Plan I+D and IV antibiotics/PICC line  Media Pizzini,STEVEN R 02/09/2012, 4:42 PM

## 2012-02-10 LAB — BASIC METABOLIC PANEL
BUN: 18 mg/dL (ref 6–23)
CO2: 25 mEq/L (ref 19–32)
Calcium: 8.8 mg/dL (ref 8.4–10.5)
Creatinine, Ser: 0.85 mg/dL (ref 0.50–1.35)
Glucose, Bld: 149 mg/dL — ABNORMAL HIGH (ref 70–99)

## 2012-02-10 LAB — HEMOGLOBIN AND HEMATOCRIT, BLOOD: HCT: 33.6 % — ABNORMAL LOW (ref 39.0–52.0)

## 2012-02-10 MED ORDER — SODIUM CHLORIDE 0.9 % IJ SOLN
10.0000 mL | INTRAMUSCULAR | Status: DC | PRN
  Administered 2012-02-10 – 2012-02-11 (×2): 10 mL

## 2012-02-10 MED ORDER — SODIUM CHLORIDE 0.9 % IJ SOLN: 10.0000 mL | Freq: Two times a day (BID) | INTRAMUSCULAR | Status: DC

## 2012-02-10 NOTE — Progress Notes (Signed)
Received call from [facility] stating they received face sheet and request for home health services for IV antibiotics. Advised that home health orders are needed in system as soon as possible in order to ensure start of care date on 02-11-12 for nursing. Made patient's nurse aware. Raiford Noble, MSN-Ed, BSN, RN  [phone]

## 2012-02-10 NOTE — Progress Notes (Signed)
Spoke with patient's nurse. Patient to discharge home on IV Vancomycin. This will be arranged with [facility]. Will fax patient's face sheet to [facility] and also spoke with [facility] rep to make aware of discharge of likely tomorrow as home health nursing must be available to accommodate the need for IV antibiotics. Will need home health orders in system in order to complete home health set up.  Raiford Noble, MSN-Ed, BSN, RN, [phone]

## 2012-02-10 NOTE — Op Note (Signed)
Ricky Lee, Ricky Lee NO.:  [id number]  MEDICAL RECORD NO.:  [id number]  LOCATION:  5N21C                        FACILITY:  [facility]  PHYSICIAN:  Almedia Balls. Ranell Patrick, M.D. DATE OF BIRTH:  06/02/1933  DATE OF PROCEDURE: DATE OF DISCHARGE:                              OPERATIVE REPORT   PREOPERATIVE DIAGNOSIS:  Right shoulder infection.  POSTOPERATIVE DIAGNOSIS:  Suspected right shoulder infection, superficial.  PROCEDURE PERFORMED:  Right shoulder I and D with polyethylene exchange for reverse total shoulder arthroplasty.  ATTENDING SURGEON:  Almedia Balls. Ranell Patrick, MD.  ASSISTANT:  Donnie Coffin. Dixon, PA-C, who scrubbed the entire procedure and necessary for satisfactory completion of surgery.  ANESTHESIA:  General anesthesia was used.  ESTIMATED BLOOD LOSS:  About 250 mL.  FLUID REPLACEMENT:  1500 mL of crystalloid.  INSTRUMENT COUNTS:  Correct.  COMPLICATIONS:  There were no complications.  Perioperative antibiotics given after cultures were obtained.  INDICATIONS:  The patient is a 78 year old male who is status post right reverse total shoulder arthroplasty about a month ago.  Patient developed postoperative erythema, not responsive to oral antibiotics. Patient is brought now for suspicion of persistent infection in the right shoulder either superficial or deep, and was taken to the operating theater for I and D, and polyethylene exchange.  Informed consent obtained.  DESCRIPTION OF PROCEDURE:  After adequate level of anesthesia achieved, the patient was positioned in the modified beach-chair position.  Right shoulder correctly identified.  Time-out called.  Sterile prep and drape of the shoulder and arm was performed.  There was a little bit of erythema noted around the incision, it has been persistent since last 2 visits in the office.  Upon incision into the skin, there was noted to be some suture material remaining.  There was no purulent  material found.  We removed all the suture material.  We got down to the muscle repaired and the deltopectoral repaired and it was intact.  I probed that with my finger.  There were no soft spot certainly nothing bubbling up underneath.  We pulse irrigated that outer layer with a liter of irrigation.  We then went ahead and removed the sutures of the deltopectoral and then had developed that interval was well healed with my finger.  I was able to remove all suture from this layer.  This took [place] down into the joint level.  We sent cultures from the superficial level first and washed it and then went down to the joint level took fluid.  No purulence noted.  No cloudy fluid.  We sent aerobic, anaerobic, Gram stain from both levels.  We then went ahead and administered 1500 mg of vancomycin IV.  Next, we went ahead and pulse irrigated the joint.  We then went ahead and dislocated the shoulder. The shoulder was nice and stable.  The subscap was then repaired.  There was about 50% of that was still intact with some loose sutures where the upper part of torn.  We went and snipped those remaining sutures and there just was not a whole lot left subscap, so we are planning on sacrificing that.  Next, we removed the polyethylene insert.  We pulse irrigated.  Using sharp debridement with rongeurs to clean out all fibrinous material and organized hematoma.  We did 3 additional L of normal saline irrigation plus 500 mL of bacitracin irrigation, antibiotic irrigation.  Once that was done, we placed another +9 poly, impacted that reduced the shoulder.  We were happy with negative sulcus, negative gapping with external rotation, and nice tension through a full arc of motion.  On the conjoined tendon, make sure that was out of the reduction.  The subscap was trimmed off.  We then went and closed in layers with #1 PDS for the deep muscular layer and then a full-thickness vertical mattress sutures for the  subcu and skin.  Sterile compressive bandage was applied.  The patient was taken to recovery room having tolerated surgery well.     Almedia Balls. Ranell Patrick, M.D.     SRN/MEDQ  D:  02/09/2012  T:  02/10/2012  Job:  [id number]

## 2012-02-10 NOTE — Progress Notes (Signed)
Peripherally Inserted Central Catheter/Midline Placement  The IV Nurse has discussed with the patient and/or persons authorized to consent for the patient, the purpose of this procedure and the potential benefits and risks involved with this procedure.  The benefits include less needle sticks, lab draws from the catheter and patient may be discharged home with the catheter.  Risks include, but not limited to, infection, bleeding, blood clot (thrombus formation), and puncture of an artery; nerve damage and irregular heat beat.  Alternatives to this procedure were also discussed.  PICC/Midline Placement Documentation  PICC / Midline Single Lumen 02/10/12 PICC Left Basilic (Active)       Ethelda Chick 02/10/2012, 9:44 AM

## 2012-02-10 NOTE — Evaluation (Signed)
Occupational Therapy Evaluation Patient Details Name: Ricky Lee MRN: [id number] DOB: 06-24-1933 Today's Date: 02/10/2012 Time: 4098-1191 OT Time Calculation (min): 18 min  OT Assessment / Plan / Recommendation Clinical Impression  78 yo male s/p Rt shoulder I& D without acute OT needs. OT to sign off acutely Pt is progressing well.    OT Assessment  Patient does not need any further OT services    Follow Up Recommendations  No OT follow up    Barriers to Discharge      Equipment Recommendations  None recommended by OT    Recommendations for Other Services    Frequency       Precautions / Restrictions Precautions Precautions: Shoulder (I and D) Restrictions RUE Weight Bearing:  (limit weight bearing)   Pertinent Vitals/Pain None reported at this time    ADL  Upper Body Dressing: Modified independent Where Assessed - Upper Body Dressing: Unsupported sitting Lower Body Dressing: Modified independent Where Assessed - Lower Body Dressing: Unsupported sit to stand Toilet Transfer: Modified independent Toilet Transfer Method: Sit to Barista: Regular height toilet Equipment Used: Gait belt Transfers/Ambulation Related to ADLs: Pt ambulating mod i and ready to d/c home ADL Comments: pt with 100% recall of precautions and PTA was completing adls independently. pt now with I&D to Rt shouldr and with wife (A) for shoes was able to complete dressing mod i    OT Diagnosis:    OT Problem List:   OT Treatment Interventions:     OT Goals    Visit Information  Last OT Received On: 02/10/12 Assistance Needed: +1    Subjective Data  Subjective: "Oh I was doing it all at home before" Patient Stated Goal: to go home today   Prior Functioning     Home Living Lives With: Spouse Available Help at Discharge: Family;Available 24 hours/day Type of Home: House Bathroom Shower/Tub: Health visitor: Standard Additional Comments: Wife  supportive and able to assist him as needed Prior Function Level of Independence: Independent Able to Take Stairs?: Yes Driving: Yes Vocation: Retired         Museum/gallery exhibitions officer  Overall Cognitive Status: Appears within functional limits for tasks assessed/performed Arousal/Alertness: Awake/alert Orientation Level: Appears intact for tasks assessed Behavior During Session: [facility] for tasks performed    Extremity/Trunk Assessment Right Upper Extremity Assessment RUE ROM/Strength/Tone: Deficits Left Upper Extremity Assessment LUE ROM/Strength/Tone: Within functional levels Trunk Assessment Trunk Assessment: Normal     Mobility Transfers Sit to Stand: 7: Independent Stand to Sit: 7: Independent     Shoulder Instructions     Exercise     Balance     End of Session OT - End of Session Activity Tolerance: Patient tolerated treatment well Patient left: in chair;with call bell/phone within reach  GO     Lucile Shutters 02/10/2012, 4:01 PM Pager: [phone]

## 2012-02-10 NOTE — Progress Notes (Signed)
Subjective: 1 Day Post-Op Procedure(s) (LRB): IRRIGATION AND DEBRIDEMENT SHOULDER (Right) Patient reports pain as mild.  Patient wanting to go home however  Home antibiotics not available until tomorrow.  Objective: Vital signs in last 24 hours: Temp:  [97.6 F (36.4 C)-98.4 F (36.9 C)] 97.8 F (36.6 C) (12/14 0611) Pulse Rate:  [25-107] 94  (12/14 0611) Resp:  [13-20] 18  (12/14 0611) BP: (94-144)/(56-95) 94/56 mmHg (12/14 0611) SpO2:  [83 %-99 %] 97 % (12/14 0611) Weight:  [127.007 kg (280 lb)] 127.007 kg (280 lb) (12/13 1451)  Intake/Output from previous day: 12/13 0701 - 12/14 0700 In: 1161.7 [I.V.:1161.7] Out: 500 [Urine:450; Blood:50] Intake/Output this shift:     Basename 02/10/12 0550 02/09/12 1500  HGB 11.1* 13.5    Basename 02/10/12 0550 02/09/12 1500  WBC -- 6.9  RBC -- 4.38  HCT 33.6* 41.7  PLT -- 228    Basename 02/10/12 0550 02/09/12 1500  NA 135 142  K 4.4 4.2  CL 99 105  CO2 25 27  BUN 18 18  CREATININE 0.85 0.83  GLUCOSE 149* 106*  CALCIUM 8.8 9.5   No results found for this basename: LABPT:2,INR:2 in the last 72 hours Right UE: Neurovascular intact Sensation intact distally Dressing clean dry and intact Assessment/Plan: 1 Day Post-Op Procedure(s) (LRB): IRRIGATION AND DEBRIDEMENT SHOULDER (Right)  Plan for discharge tomorrow when home IV antibiotics available  Richardean Canal 02/10/2012, 1:36 PM

## 2012-02-11 LAB — WOUND CULTURE: Culture: NO GROWTH

## 2012-02-11 MED ORDER — OXYCODONE-ACETAMINOPHEN 5-325 MG PO TABS: 1.0000 | ORAL_TABLET | ORAL | Status: DC | PRN

## 2012-02-11 MED ORDER — METHOCARBAMOL 500 MG PO TABS: 500.0000 mg | ORAL_TABLET | Freq: Three times a day (TID) | ORAL | Status: DC | PRN

## 2012-02-11 MED ORDER — VANCOMYCIN HCL IN DEXTROSE 1-5 GM/200ML-% IV SOLN
1000.0000 mg | Freq: Two times a day (BID) | INTRAVENOUS | Status: DC
  Administered 2012-02-11: 1000 mg via INTRAVENOUS
  Filled 2012-02-11 (×2): qty 200

## 2012-02-11 MED ORDER — GLUCOSE 40 % PO GEL
ORAL | Status: AC
  Filled 2012-02-11: qty 1

## 2012-02-11 MED ORDER — HEPARIN SOD (PORK) LOCK FLUSH 100 UNIT/ML IV SOLN
250.0000 [IU] | INTRAVENOUS | Status: AC | PRN
  Administered 2012-02-11: 250 [IU]

## 2012-02-11 MED ORDER — VANCOMYCIN HCL IN DEXTROSE 1-5 GM/200ML-% IV SOLN: 1000.0000 mg | Freq: Two times a day (BID) | INTRAVENOUS | Status: DC

## 2012-02-11 MED ORDER — VANCOMYCIN HCL 10 G IV SOLR
1250.0000 mg | Freq: Two times a day (BID) | INTRAVENOUS | Status: DC
  Filled 2012-02-11: qty 1250

## 2012-02-11 MED ORDER — VANCOMYCIN HCL 10 G IV SOLR: 1250.0000 mg | Freq: Two times a day (BID) | INTRAVENOUS | Status: DC

## 2012-02-11 NOTE — Progress Notes (Signed)
Subjective: 2 Days Post-Op Procedure(s) (LRB): IRRIGATION AND DEBRIDEMENT SHOULDER (Right) Patient reports pain as mild.    Objective: Vital signs in last 24 hours: Temp:  [97.7 F (36.5 C)-99 F (37.2 C)] 97.7 F (36.5 C) (12/15 0536) Pulse Rate:  [68-92] 88  (12/15 0536) Resp:  [18-20] 18  (12/15 0536) BP: (101-147)/(58-78) 102/62 mmHg (12/15 0536) SpO2:  [96 %-100 %] 96 % (12/15 0536)  Intake/Output from previous day: 12/14 0701 - 12/15 0700 In: 660 [P.O.:660] Out: 250 [Urine:250] Intake/Output this shift:     Basename 02/10/12 0550 02/09/12 1500  HGB 11.1* 13.5    Basename 02/10/12 0550 02/09/12 1500  WBC -- 6.9  RBC -- 4.38  HCT 33.6* 41.7  PLT -- 228    Basename 02/10/12 0550 02/09/12 1500  NA 135 142  K 4.4 4.2  CL 99 105  CO2 25 27  BUN 18 18  CREATININE 0.85 0.83  GLUCOSE 149* 106*  CALCIUM 8.8 9.5   No results found for this basename: LABPT:2,INR:2 in the last 72 hours  No change neurovascular status Wound clean, dry, intact with no erythema.   Assessment/Plan: 2 Days Post-Op Procedure(s) (LRB): IRRIGATION AND DEBRIDEMENT SHOULDER (Right) Discharge home with home health IV Vanco  Ricky Lee A 02/11/2012, 11:29 AM

## 2012-02-11 NOTE — Progress Notes (Signed)
   CARE MANAGEMENT NOTE 02/11/2012  Patient:  Ricky Lee, Ricky Lee   Account Number:  [id number]  Date Initiated:  02/11/2012  Documentation initiated by:  [facility]  Subjective/Objective Assessment:     Action/Plan:   Anticipated DC Date:  02/11/2012   Anticipated DC Plan:  HOME W HOME HEALTH SERVICES      DC Planning Services  CM consult      [facility] Choice  HOME HEALTH   Choice offered to / List presented to:  C-1 Patient        HH arranged  HH-1 RN  IV Antibiotics      HH agency  [facility]   Status of service:  Completed, signed off Medicare Important Message given?   (If response is "NO", the following Medicare IM given date fields will be blank) Date Medicare IM given:   Date Additional Medicare IM given:    Discharge Disposition:  HOME W HOME HEALTH SERVICES  Per UR Regulation:    If discussed at Long Length of Stay Meetings, dates discussed:    Comments:  02/11/2012 1400 Faxed completed HH RN and IV abx Rx ( for pharmacy dosing), Pharmacy note, labs, and H&P to [facility] for scheduled d/c today home with IV Vancomycin.  Isidoro Donning RN CCM Case Mgmt phone [phone]  02/11/2012 1200 NCM spoke with Unit RN and explained clarification needed for IV medication for home. Unit RN will follow up with MD.  Isidoro Donning RN CCM Case Mgmt phone [phone]  02/11/2012 1000 NCM spoke to pt and explained IV dose will administered this am and [facility] RN will be out this evening to administer dose. Added [facility] contact info to d/c instructions. Waiting clarification on IV Vanc dose to fax [facility]. Spoke to [facility] rep and [facility] RN available this evening to administer medication. Isidoro Donning RN CCM Case Mgmt phone [phone]

## 2012-02-11 NOTE — Progress Notes (Addendum)
ANTIBIOTIC CONSULT NOTE - INITIAL  Pharmacy Consult for Vancomycin Indication: PJI post-irrigation  Allergies  Allergen Reactions  . Coumadin (Warfarin)     Patient Measurements: Height: 5\' 8"  (172.7 cm) Weight: 280 lb (127.007 kg) IBW/kg (Calculated) : 68.4    Vital Signs: Temp: 97.7 F (36.5 C) (12/15 0536) Temp src: Oral (12/15 0536) BP: 102/62 mmHg (12/15 0536) Pulse Rate: 88  (12/15 0536) Intake/Output from previous day: 12/14 0701 - 12/15 0700 In: 660 [P.O.:660] Out: 250 [Urine:250] Intake/Output from this shift: Total I/O In: 420 [P.O.:420] Out: -   Labs:  Basename 02/10/12 0550 02/09/12 1500  WBC -- 6.9  HGB 11.1* 13.5  PLT -- 228  LABCREA -- --  CREATININE 0.85 0.83   Estimated Creatinine Clearance: 96 ml/min (by C-G formula based on Cr of 0.85). No results found for this basename: VANCOTROUGH:2,VANCOPEAK:2,VANCORANDOM:2,GENTTROUGH:2,GENTPEAK:2,GENTRANDOM:2,TOBRATROUGH:2,TOBRAPEAK:2,TOBRARND:2,AMIKACINPEAK:2,AMIKACINTROU:2,AMIKACIN:2, in the last 72 hours   Microbiology: Recent Results (from the past 720 hour(s))  SURGICAL PCR SCREEN     Status: Normal   Collection Time   02/09/12  3:00 PM      Component Value Range Status Comment   MRSA, PCR NEGATIVE  NEGATIVE Final    Staphylococcus aureus NEGATIVE  NEGATIVE Final   WOUND CULTURE     Status: Normal   Collection Time   02/09/12  5:57 PM      Component Value Range Status Comment   Specimen Description WOUND SHOULDER RIGHT   Final    Special Requests PATIENT ON FOLLOWING KEFLEX SUPERFICIAL   Final    Gram Stain     Final    Value: RARE WBC PRESENT, PREDOMINANTLY MONONUCLEAR     FEW SQUAMOUS EPITHELIAL CELLS PRESENT     NO ORGANISMS SEEN   Culture NO GROWTH 2 DAYS   Final    Report Status 02/11/2012 FINAL   Final   ANAEROBIC CULTURE     Status: Normal (Preliminary result)   Collection Time   02/09/12  5:57 PM      Component Value Range Status Comment   Specimen Description WOUND SHOULDER RIGHT    Final    Special Requests PATIENT ON FOLLOWING KEFLEX SUPERFICIAL   Final    Gram Stain     Final    Value: RARE WBC PRESENT, PREDOMINANTLY MONONUCLEAR     FEW SQUAMOUS EPITHELIAL CELLS PRESENT     NO ORGANISMS SEEN   Culture     Final    Value: NO ANAEROBES ISOLATED; CULTURE IN PROGRESS FOR 5 DAYS   Report Status PENDING   Incomplete   WOUND CULTURE     Status: Normal   Collection Time   02/09/12  6:02 PM      Component Value Range Status Comment   Specimen Description WOUND SHOULDER RIGHT   Final    Special Requests PATIENT ON FOLLOWING KEFLEX DEEP CULTURE   Final    Gram Stain     Final    Value: FEW WBC PRESENT,BOTH PMN AND MONONUCLEAR     NO ORGANISMS SEEN   Culture NO GROWTH 2 DAYS   Final    Report Status 02/11/2012 FINAL   Final   ANAEROBIC CULTURE     Status: Normal (Preliminary result)   Collection Time   02/09/12  6:02 PM      Component Value Range Status Comment   Specimen Description WOUND SHOULDER RIGHT   Final    Special Requests PATIENT ON FOLLOWING KEFLEX DEEP CULTURE   Final    Gram Stain  Final    Value: FEW WBC PRESENT,BOTH PMN AND MONONUCLEAR     NO ORGANISMS SEEN   Culture     Final    Value: NO ANAEROBES ISOLATED; CULTURE IN PROGRESS FOR 5 DAYS   Report Status PENDING   Incomplete     Medical History: Past Medical History  Diagnosis Date  . Myocardial infarction 1984  . Hypertension   . Sleep apnea     possible - having sleep study 02/06/12  . Cancer     oral cancer in past, skin cancer  . Arthritis   . Hyperlipemia     Medications:  Prescriptions prior to admission  Medication Sig Dispense Refill  . aspirin 325 MG tablet Take 325 mg by mouth daily.      Marland Kitchen co-enzyme Q-10 30 MG capsule Take 30 mg by mouth daily. Hold while in hospital      . fish oil-omega-3 fatty acids 1000 MG capsule Take 2 g by mouth 2 (two) times daily.      . Garlic 1000 MG CAPS Take 1 capsule by mouth daily.      Marland Kitchen lisinopril (PRINIVIL,ZESTRIL) 5 MG tablet Take 5  mg by mouth daily.      Marland Kitchen LYCOPENE PO Take 1 tablet by mouth daily.      . metoprolol tartrate (LOPRESSOR) 25 MG tablet Take 25 mg by mouth 2 (two) times daily.      . Multiple Vitamin (MULTIVITAMIN WITH MINERALS) TABS Take 1 tablet by mouth daily.      . naproxen sodium (ANAPROX) 220 MG tablet Take 440 mg by mouth 2 (two) times daily as needed. For pain      . simvastatin (ZOCOR) 40 MG tablet Take 40 mg by mouth at bedtime.      . vitamin E 400 UNIT capsule Take 400 Units by mouth daily.       Assessment: 78 yo M came in for washout of prosthetic shoulder joint infection. Pt has been afebrile with normal WBC, but elevated sed rate. Pharmacy consulted for outpt vancomycin dosing. Current dosing of 1000mg  q12h is on the low end for pt's weight and renal function. At his age close f/u is recommended.   Goal of Therapy:  Vancomycin trough level 15-20 mcg/ml  Plan:  - Increase Vancomycin to 1250 mg q12h - Draw vancomycin level <30 mins before 4th dose then weekly thereafter - Twice weekly Bmets to follow SCr - F/u culture results   Reccomendations: - IV antibiotics are normally recommended for 4-6 week often times with rifampins for PJI with possible suppressive therapy thereafter  - Consider having lab hold culture for 14 days, P.acnes is a common cause of shoulder PJI and takes a prolonged period to grow.   Thank, you Vania Rea. Darin Engels.D. Clinical Pharmacist Pager [phone] Phone [phone] 02/11/2012 1:01 PM   Addendum- Spoke with Dr. Ranell Patrick who felt that this was likely a cellulitis and not a PJI. He said he will f/u with the pt after abx finish and have the lab hold the culture for 14 days.

## 2012-02-11 NOTE — Progress Notes (Signed)
Patient is refusing CPAP. Says he does not wear it at home, and does not want to wear it here.

## 2012-02-12 ENCOUNTER — Encounter (HOSPITAL_COMMUNITY): Payer: Self-pay | Admitting: Orthopedic Surgery

## 2012-02-14 LAB — ANAEROBIC CULTURE

## 2012-02-16 NOTE — Discharge Summary (Signed)
Physician Discharge Summary  Patient ID: Ricky Lee MRN: [id number] DOB/AGE: 1933-05-11 78 y.o.  Admit date: 02/09/2012 Discharge date: 02/16/2012  Admission Diagnoses:  Suspected infection following total shoulder replacement  Discharge Diagnoses:  Same   Surgeries: Procedure(s): IRRIGATION AND DEBRIDEMENT SHOULDER on 02/09/2012   Consultants: D/C planning Discharged Condition: Stable  Hospital Course: Ricky BRUMMET is an 78 y.o. male who was admitted 02/09/2012 with a chief complaint of redness and pain right shoulder, and found to have a diagnosis of suspected infection of the right shoulder.  They were brought to the operating room on 02/09/2012 and underwent the above named procedures.    The patient had an uncomplicated hospital course and was stable for discharge.  Recent vital signs:  Filed Vitals:   02/11/12 0536  BP: 102/62  Pulse: 88  Temp: 97.7 F (36.5 C)  Resp: 18    Recent laboratory studies:  Results for orders placed during the hospital encounter of 02/09/12  BASIC METABOLIC PANEL      Component Value Range   Sodium 142  135 - 145 mEq/L   Potassium 4.2  3.5 - 5.1 mEq/L   Chloride 105  96 - 112 mEq/L   CO2 27  19 - 32 mEq/L   Glucose, Bld 106 (*) 70 - 99 mg/dL   BUN 18  6 - 23 mg/dL   Creatinine, Ser 2.13  0.50 - 1.35 mg/dL   Calcium 9.5  8.4 - 08.6 mg/dL   GFR calc non Af Amer 83 (*) >90 mL/min   GFR calc Af Amer >90  >90 mL/min  CBC WITH DIFFERENTIAL      Component Value Range   WBC 6.9  4.0 - 10.5 K/uL   RBC 4.38  4.22 - 5.81 MIL/uL   Hemoglobin 13.5  13.0 - 17.0 g/dL   HCT 57.8  46.9 - 62.9 %   MCV 95.2  78.0 - 100.0 fL   MCH 30.8  26.0 - 34.0 pg   MCHC 32.4  30.0 - 36.0 g/dL   RDW 52.8  41.3 - 24.4 %   Platelets 228  150 - 400 K/uL   Neutrophils Relative 60  43 - 77 %   Neutro Abs 4.1  1.7 - 7.7 K/uL   Lymphocytes Relative 27  12 - 46 %   Lymphs Abs 1.8  0.7 - 4.0 K/uL   Monocytes Relative 12  3 - 12 %   Monocytes Absolute  0.8  0.1 - 1.0 K/uL   Eosinophils Relative 2  0 - 5 %   Eosinophils Absolute 0.1  0.0 - 0.7 K/uL   Basophils Relative 0  0 - 1 %   Basophils Absolute 0.0  0.0 - 0.1 K/uL  SEDIMENTATION RATE      Component Value Range   Sed Rate 35 (*) 0 - 16 mm/hr  SURGICAL PCR SCREEN      Component Value Range   MRSA, PCR NEGATIVE  NEGATIVE   Staphylococcus aureus NEGATIVE  NEGATIVE  WOUND CULTURE      Component Value Range   Specimen Description WOUND SHOULDER RIGHT     Special Requests PATIENT ON FOLLOWING KEFLEX SUPERFICIAL     Gram Stain       Value: RARE WBC PRESENT, PREDOMINANTLY MONONUCLEAR     FEW SQUAMOUS EPITHELIAL CELLS PRESENT     NO ORGANISMS SEEN   Culture NO GROWTH 2 DAYS     Report Status 02/11/2012 FINAL    ANAEROBIC CULTURE  Component Value Range   Specimen Description WOUND SHOULDER RIGHT     Special Requests PATIENT ON FOLLOWING KEFLEX SUPERFICIAL     Gram Stain       Value: RARE WBC PRESENT, PREDOMINANTLY MONONUCLEAR     FEW SQUAMOUS EPITHELIAL CELLS PRESENT     NO ORGANISMS SEEN   Culture NO ANAEROBES ISOLATED     Report Status 02/14/2012 FINAL    WOUND CULTURE      Component Value Range   Specimen Description WOUND SHOULDER RIGHT     Special Requests PATIENT ON FOLLOWING KEFLEX DEEP CULTURE     Gram Stain       Value: FEW WBC PRESENT,BOTH PMN AND MONONUCLEAR     NO ORGANISMS SEEN   Culture NO GROWTH 2 DAYS     Report Status 02/11/2012 FINAL    ANAEROBIC CULTURE      Component Value Range   Specimen Description WOUND SHOULDER RIGHT     Special Requests PATIENT ON FOLLOWING KEFLEX DEEP CULTURE     Gram Stain       Value: FEW WBC PRESENT,BOTH PMN AND MONONUCLEAR     NO ORGANISMS SEEN   Culture NO ANAEROBES ISOLATED     Report Status 02/14/2012 FINAL    HEMOGLOBIN AND HEMATOCRIT, BLOOD      Component Value Range   Hemoglobin 11.1 (*) 13.0 - 17.0 g/dL   HCT 78.2 (*) 95.6 - 21.3 %  BASIC METABOLIC PANEL      Component Value Range   Sodium 135  135 - 145  mEq/L   Potassium 4.4  3.5 - 5.1 mEq/L   Chloride 99  96 - 112 mEq/L   CO2 25  19 - 32 mEq/L   Glucose, Bld 149 (*) 70 - 99 mg/dL   BUN 18  6 - 23 mg/dL   Creatinine, Ser 0.86  0.50 - 1.35 mg/dL   Calcium 8.8  8.4 - 57.8 mg/dL   GFR calc non Af Amer 83 (*) >90 mL/min   GFR calc Af Amer >90  >90 mL/min    Discharge Medications:     Medication List     As of 02/16/2012  7:31 AM    TAKE these medications         aspirin 325 MG tablet   Take 325 mg by mouth daily.      co-enzyme Q-10 30 MG capsule   Take 30 mg by mouth daily. Hold while in hospital      fish oil-omega-3 fatty acids 1000 MG capsule   Take 2 g by mouth 2 (two) times daily.      Garlic 1000 MG Caps   Take 1 capsule by mouth daily.      lisinopril 5 MG tablet   Commonly known as: PRINIVIL,ZESTRIL   Take 5 mg by mouth daily.      LYCOPENE PO   Take 1 tablet by mouth daily.      methocarbamol 500 MG tablet   Commonly known as: ROBAXIN   Take 1 tablet (500 mg total) by mouth 3 (three) times daily as needed.      metoprolol tartrate 25 MG tablet   Commonly known as: LOPRESSOR   Take 25 mg by mouth 2 (two) times daily.      multivitamin with minerals Tabs   Take 1 tablet by mouth daily.      naproxen sodium 220 MG tablet   Commonly known as: ANAPROX   Take 440 mg by mouth  2 (two) times daily as needed. For pain      oxyCODONE-acetaminophen 5-325 MG per tablet   Commonly known as: PERCOCET/ROXICET   Take 1-2 tablets by mouth every 4 (four) hours as needed for pain.      simvastatin 40 MG tablet   Commonly known as: ZOCOR   Take 40 mg by mouth at bedtime.      sodium chloride 0.9 % SOLN 250 mL with vancomycin 10 G SOLR 1,250 mg   Inject 1,250 mg into the vein every 12 (twelve) hours.      vancomycin 1 GM/200ML Soln   Commonly known as: VANCOCIN   Inject 200 mLs (1,000 mg total) into the vein every 12 (twelve) hours.      vitamin E 400 UNIT capsule   Take 400 Units by mouth daily.         Diagnostic Studies: Dg Shoulder Right Port  02-26-12  *RADIOLOGY REPORT*  Clinical Data: Postop incision and drainage right shoulder.  PORTABLE RIGHT SHOULDER - 2+ VIEW  Comparison: 01/19/2012  Findings: Single AP portable view of the right shoulder is obtained.  There are postoperative changes with right shoulder arthroplasty including glenoid component.  Components appear well seated on single view.  No evidence of acute fracture or subluxation.  No soft tissue foreign body are gas collections appreciated.  Atelectasis noted in the right lung base.  IMPRESSION: Postoperative changes with right shoulder replacement.  No significant change since previous study.   Original Report Authenticated By: Burman Nieves, M.D.    Dg Shoulder Right Port  01/19/2012  *RADIOLOGY REPORT*  Clinical Data: Shoulder surgery  PORTABLE RIGHT SHOULDER - 2+ VIEW  Comparison: None.  Findings: Right shoulder replacement in satisfactory position and alignment.  No fracture or acute abnormality  IMPRESSION: Right shoulder replacement.   Original Report Authenticated By: Janeece Riggers, M.D.     Disposition: 01-Home or Self Care      Discharge Orders    Future Orders Please Complete By Expires   Discharge wound care:      Comments:   Change dressing daily Keep steri strips in place      Follow-up Information    Follow up with [facility]. [facility] RN, IV antibiotics)    Contact information:   [phone]          Signed: Verlee Rossetti 02/16/2012, 7:31 AM

## 2012-02-28 HISTORY — PX: JOINT REPLACEMENT: SHX530

## 2014-04-02 NOTE — Progress Notes (Addendum)
Anesthesia Note: 82 year old male scheduled for left TKA on 04/17/14 by Dr. [name].   History includes former smoker, HTN, HLD, OSA, MI s/p angioplasty '84, oral and skin cancer, arthritis, right reverse total shoulder '13, right TKA '99. PCP is listed as Dr. Wende Neighbors.  Cardiologist is Dr. Agustin Cree with [facility]).  He felt patient was acceptable risk for surgery from a cardiac standpoint. His notations mention a stress test in May that showed "no evidence of ischemia" (report requested).  Echo on 12/14/11 [facility]; scanned under Media tab) showed normal LVEF 62%, trace MR/TR.  04/07/14 EKG: SB at 59 bpm, septal infarct (age undetermined). Overall, I think his EKG is stable when compared to 12/06/11 EKG from [facility].   04/07/14 CXR: Bibasilar subsegmental atelectasis and/or scarring. No change. No acute cardiopulmonary disease.  Preoperative labs noted. Orders were pending at PAT, so any additional orders per surgeon will be done on the day of surgery.  Patient did inquire about anesthesia options.  We discussed spinal versus GA.  At this point, he would prefer GA.  I did inform him he could further discuss with his assigned anesthesiologist on the day of surgery.   He has been cleared by his cardiologist.  Last stress test requested from [facility], but is still pending--notes indicate it was non-ischemic.  EF was normal in 2013.  If no acute changes then I would anticipate that he could proceed as planned.  Ricky Lee [facility] Short Stay Center/Anesthesiology Phone [phone] 04/07/2014 5:41 PM  Addendum: 07/01/13 nuclear stress test: Myocardial perfusion imaging showed no ischemia. Overall LV systolic function was normal without regional wall motion abnormalities. The LVEF was 58%.  [facility] Short Stay Center/Anesthesiology Phone [phone] 04/16/2014 3:14 PM

## 2014-04-06 ENCOUNTER — Other Ambulatory Visit (HOSPITAL_COMMUNITY): Payer: Self-pay | Admitting: *Deleted

## 2014-04-06 NOTE — Pre-Procedure Instructions (Signed)
Kallum Jorgensen [place]  04/06/2014   Your procedure is scheduled on:  Friday, April 17, 2014 at 11:00 AM.   Report to [facility] Entrance "A" Admitting Office at 9:00 AM.   Call this number if you have problems the morning of surgery: [phone]               Any questions prior to day of surgery, please call [phone] between 8 & 4 PM.   Remember:   Do not eat food or drink liquids after midnight Thursday, 04/16/14.   Take these medicines the morning of surgery with A SIP OF WATER: Metoprolol (Lopressor)  Stop Aspirin 7 days prior to surger.   Do not wear jewelry.  Do not wear lotions, powders, or cologne. You may wear deodorant.  Men may shave face and neck.  Do not bring valuables to the hospital.  [facility] is not responsible                  for any belongings or valuables.               Contacts, dentures or bridgework may not be worn into surgery.  Leave suitcase in the car. After surgery it may be brought to your room.  For patients admitted to the hospital, discharge time is determined by your                treatment team.            Special Instructions: [place] - Preparing for Surgery  Before surgery, you can play an important role.  Because skin is not sterile, your skin needs to be as free of germs as possible.  You can reduce the number of germs on you skin by washing with CHG (chlorahexidine gluconate) soap before surgery.  CHG is an antiseptic cleaner which kills germs and bonds with the skin to continue killing germs even after washing.  Please DO NOT use if you have an allergy to CHG or antibacterial soaps.  If your skin becomes reddened/irritated stop using the CHG and inform your nurse when you arrive at Short Stay.  Do not shave (including legs and underarms) for at least 48 hours prior to the first CHG shower.  You may shave your face.  Please follow these instructions carefully:   1.  Shower with CHG Soap the night before surgery and the                                 morning of Surgery.  2.  If you choose to wash your hair, wash your hair first as usual with your       normal shampoo.  3.  After you shampoo, rinse your hair and body thoroughly to remove the                      Shampoo.  4.  Use CHG as you would any other liquid soap.  You can apply chg directly       to the skin and wash gently with scrungie or a clean washcloth.  5.  Apply the CHG Soap to your body ONLY FROM THE NECK DOWN.        Do not use on open wounds or open sores.  Avoid contact with your eyes, ears, mouth and genitals (private parts).  Wash genitals (private parts) with your normal soap.  6.  Wash thoroughly, paying special attention to the area where your surgery        will be performed.  7.  Thoroughly rinse your body with warm water from the neck down.  8.  DO NOT shower/wash with your normal soap after using and rinsing off       the CHG Soap.  9.  Pat yourself dry with a clean towel.            10.  Wear clean pajamas.            11.  Place clean sheets on your bed the night of your first shower and do not        sleep with pets.  Day of Surgery  Do not apply any lotions the morning of surgery.  Please wear clean clothes to the hospital.     Please read over the following fact sheets that you were given: Pain Booklet, Coughing and Deep Breathing, Blood Transfusion Information, MRSA Information and Surgical Site Infection Prevention

## 2014-04-07 ENCOUNTER — Encounter (HOSPITAL_COMMUNITY): Payer: Self-pay

## 2014-04-07 ENCOUNTER — Encounter (HOSPITAL_COMMUNITY)
Admission: RE | Admit: 2014-04-07 | Discharge: 2014-04-07 | Disposition: A | Discharge status: Home / Self Care | Payer: Medicare Other | Source: Ambulatory Visit | Attending: Anesthesiology | Admitting: Anesthesiology

## 2014-04-07 ENCOUNTER — Encounter (HOSPITAL_COMMUNITY)
Admission: RE | Admit: 2014-04-07 | Discharge: 2014-04-07 | Disposition: A | Discharge status: Home / Self Care | Payer: Medicare Other | Source: Ambulatory Visit | Attending: Orthopedic Surgery | Admitting: Orthopedic Surgery

## 2014-04-07 ENCOUNTER — Other Ambulatory Visit: Payer: Self-pay

## 2014-04-07 DIAGNOSIS — R001 Bradycardia, unspecified: Secondary | ICD-10-CM | POA: Insufficient documentation

## 2014-04-07 DIAGNOSIS — Z01818 Encounter for other preprocedural examination: Secondary | ICD-10-CM | POA: Insufficient documentation

## 2014-04-07 DIAGNOSIS — Z0183 Encounter for blood typing: Secondary | ICD-10-CM | POA: Diagnosis not present

## 2014-04-07 DIAGNOSIS — Z96611 Presence of right artificial shoulder joint: Secondary | ICD-10-CM | POA: Insufficient documentation

## 2014-04-07 DIAGNOSIS — R9431 Abnormal electrocardiogram [ECG] [EKG]: Secondary | ICD-10-CM | POA: Diagnosis not present

## 2014-04-07 DIAGNOSIS — M179 Osteoarthritis of knee, unspecified: Secondary | ICD-10-CM | POA: Diagnosis not present

## 2014-04-07 DIAGNOSIS — Z01812 Encounter for preprocedural laboratory examination: Secondary | ICD-10-CM | POA: Insufficient documentation

## 2014-04-07 HISTORY — DX: Atherosclerotic heart disease of native coronary artery without angina pectoris: I25.10

## 2014-04-07 LAB — CBC
HEMATOCRIT: 43.4 % (ref 39.0–52.0)
HEMOGLOBIN: 14.6 g/dL (ref 13.0–17.0)
MCH: 32 pg (ref 26.0–34.0)
MCHC: 33.6 g/dL (ref 30.0–36.0)
MCV: 95.2 fL (ref 78.0–100.0)
Platelets: 203 10*3/uL (ref 150–400)
RBC: 4.56 MIL/uL (ref 4.22–5.81)
RDW: 13.4 % (ref 11.5–15.5)
WBC: 7.5 10*3/uL (ref 4.0–10.5)

## 2014-04-07 LAB — BASIC METABOLIC PANEL
ANION GAP: 5 (ref 5–15)
BUN: 19 mg/dL (ref 6–23)
CO2: 27 mmol/L (ref 19–32)
Calcium: 9.4 mg/dL (ref 8.4–10.5)
Chloride: 112 mmol/L (ref 96–112)
Creatinine, Ser: 1.06 mg/dL (ref 0.50–1.35)
GFR calc Af Amer: 76 mL/min — ABNORMAL LOW (ref >90)
GFR calc non Af Amer: 65 mL/min — ABNORMAL LOW (ref >90)
GLUCOSE: 98 mg/dL (ref 70–99)
POTASSIUM: 4.4 mmol/L (ref 3.5–5.1)
SODIUM: 144 mmol/L (ref 135–145)

## 2014-04-07 LAB — TYPE AND SCREEN
ABO/RH(D): A POS
ANTIBODY SCREEN: NEGATIVE

## 2014-04-07 LAB — SURGICAL PCR SCREEN
MRSA, PCR: NEGATIVE
Staphylococcus aureus: NEGATIVE

## 2014-04-07 LAB — PROTIME-INR
INR: 1.03 (ref 0.00–1.49)
PROTHROMBIN TIME: 13.7 s (ref 11.6–15.2)

## 2014-04-07 LAB — APTT: aPTT: 29 seconds (ref 24–37)

## 2014-04-07 MED ORDER — CHLORHEXIDINE GLUCONATE 4 % EX LIQD: 60.0000 mL | Freq: Once | CUTANEOUS | Status: DC

## 2014-04-07 NOTE — H&P (Signed)
Ricky Lee is an 81 y.o. male.    Chief Complaint: left knee pain  HPI: Pt is a 81 y.o. male complaining of left knee pain for multiple years. Pain had continually increased since the beginning. X-rays in the clinic show end-stage arthritic changes of the left knee. Pt has tried various conservative treatments which have failed to alleviate their symptoms, including injections and therapy. Various options are discussed with the patient. Risks, benefits and expectations were discussed with the patient. Patient understand the risks, benefits and expectations and wishes to proceed with surgery.   PCP:  Wende Neighbors, MD  D/C Plans:  Home with HHPT  PMH: Past Medical History  Diagnosis Date  . Myocardial infarction 1984  . Hypertension   . Sleep apnea     possible - having sleep study 02/06/12  . Arthritis   . Hyperlipemia   . Coronary artery disease   . Cold     getting over a cold  . Cancer     oral cancer in past, skin cancer    PSH: Past Surgical History  Procedure Laterality Date  . Rotator cuff repair      right  . Tongue surgery      removal of cancer  . Leg surgery      right and leg  . Biceps tendon repair      left  . Cholecystectomy    . Cardiac catheterization    . Right foot surgery    . Tonsillectomy    . Coronary angioplasty  1984  . Total shoulder arthroplasty  01/19/2012    Procedure: TOTAL SHOULDER ARTHROPLASTY;  Surgeon: Augustin Schooling, MD;  Location: [facility];  Service: Orthopedics;  Laterality: Right;  REVERSE TOTAL SHOULDER  . Irrigation and debridement shoulder  02/09/2012    Procedure: IRRIGATION AND DEBRIDEMENT SHOULDER;  Surgeon: Augustin Schooling, MD;  Location: [place];  Service: Orthopedics;  Laterality: Right;  Right Shoulder I&D with Poly Exchange, Removal of 42 +9 Standard Poly spacer  . Joint replacement  1999    right knee  . Joint replacement  2014    right shoulder  . Eye surgery Bilateral     cataracts    Social History:  reports  that he quit smoking about 32 years ago. His smoking use included Cigarettes. He has a 58 pack-year smoking history. He has never used smokeless tobacco. He reports that he drinks alcohol. He reports that he does not use illicit drugs.  Allergies:  Allergies  Allergen Reactions  . Coumadin [Warfarin]     Medications: No current facility-administered medications for this encounter.   Current Outpatient Prescriptions  Medication Sig Dispense Refill  . aspirin 325 MG tablet Take 325 mg by mouth daily.    Marland Kitchen lisinopril (PRINIVIL,ZESTRIL) 5 MG tablet Take 5 mg by mouth daily.    . metoprolol tartrate (LOPRESSOR) 25 MG tablet Take 25 mg by mouth 2 (two) times daily.    . simvastatin (ZOCOR) 40 MG tablet Take 40 mg by mouth at bedtime.    . methocarbamol (ROBAXIN) 500 MG tablet Take 1 tablet (500 mg total) by mouth 3 (three) times daily as needed. (Patient not taking: Reported on 04/03/2014) 60 tablet 1  . oxyCODONE-acetaminophen (ROXICET) 5-325 MG per tablet Take 1-2 tablets by mouth every 4 (four) hours as needed for pain. (Patient not taking: Reported on 04/03/2014) 60 tablet 0  . sodium chloride 0.9 % SOLN 250 mL with vancomycin 10 G SOLR 1,250 mg  Inject 1,250 mg into the vein every 12 (twelve) hours. (Patient not taking: Reported on 04/03/2014) 1250 mg p  . vancomycin (VANCOCIN) 1 GM/200ML SOLN Inject 200 mLs (1,000 mg total) into the vein every 12 (twelve) hours. (Patient not taking: Reported on 04/03/2014) 4000 mL 0    No results found for this or any previous visit (from the past 48 hour(s)). No results found.  ROS: Pain with rom of the left lower extremity  Physical Exam:  Alert and oriented 81 y.o. male in no acute distress Cranial nerves 2-12 intact Cervical spine: full rom with no tenderness, nv intact distally Chest: active breath sounds bilaterally, no wheeze rhonchi or rales Heart: regular rate and rhythm, no murmur Abd: non tender non distended with active bowel sounds Hip is  stable with rom  Left knee with medial and lateral joint line tenderness nv intact distally No rashes or edema Antalgic gait  Assessment/Plan Assessment: left knee end stage osteoarthritis  Plan: Patient will undergo a left total knee arthroplasty by Dr. [name] at [facility]. Risks benefits and expectations were discussed with the patient. Patient understand risks, benefits and expectations and wishes to proceed.

## 2014-04-10 NOTE — Progress Notes (Signed)
Attempted to call [facility]- Dr. Wendy Poet office to follow-up for any cardiac testing specifically stress test.  Office is closed today, Friday, 04/10/14.  Fax requesting cardiac testing sent 04/07/14 and only office note was received.

## 2014-04-13 NOTE — Progress Notes (Signed)
Attempted to access medical records from Dr Wendy Poet office.  Unable to reach anyone today as office is closed.

## 2014-04-16 MED ORDER — DEXTROSE 5 % IV SOLN
3.0000 g | INTRAVENOUS | Status: AC
  Administered 2014-04-17: 3 g via INTRAVENOUS
  Filled 2014-04-16: qty 3000

## 2014-04-17 ENCOUNTER — Encounter (HOSPITAL_COMMUNITY): Payer: Self-pay | Admitting: *Deleted

## 2014-04-17 ENCOUNTER — Inpatient Hospital Stay (HOSPITAL_COMMUNITY): Payer: Medicare Other | Admitting: Vascular Surgery

## 2014-04-17 ENCOUNTER — Inpatient Hospital Stay (HOSPITAL_COMMUNITY)
Admission: RE | Admit: 2014-04-17 | Discharge: 2014-04-22 | DRG: 470 | Disposition: A | Discharge status: Home Health | Payer: Medicare Other | Source: Ambulatory Visit | Attending: Orthopedic Surgery | Admitting: Orthopedic Surgery

## 2014-04-17 ENCOUNTER — Inpatient Hospital Stay (HOSPITAL_COMMUNITY): Payer: Medicare Other

## 2014-04-17 ENCOUNTER — Encounter (HOSPITAL_COMMUNITY)
Admission: RE | Disposition: A | Discharge status: Home Health | Payer: Self-pay | Source: Ambulatory Visit | Attending: Orthopedic Surgery

## 2014-04-17 DIAGNOSIS — Z7982 Long term (current) use of aspirin: Secondary | ICD-10-CM | POA: Diagnosis not present

## 2014-04-17 DIAGNOSIS — M1712 Unilateral primary osteoarthritis, left knee: Principal | ICD-10-CM | POA: Diagnosis present

## 2014-04-17 DIAGNOSIS — Z85819 Personal history of malignant neoplasm of unspecified site of lip, oral cavity, and pharynx: Secondary | ICD-10-CM

## 2014-04-17 DIAGNOSIS — Z96611 Presence of right artificial shoulder joint: Secondary | ICD-10-CM | POA: Diagnosis present

## 2014-04-17 DIAGNOSIS — Z85828 Personal history of other malignant neoplasm of skin: Secondary | ICD-10-CM

## 2014-04-17 DIAGNOSIS — E785 Hyperlipidemia, unspecified: Secondary | ICD-10-CM | POA: Diagnosis present

## 2014-04-17 DIAGNOSIS — I10 Essential (primary) hypertension: Secondary | ICD-10-CM | POA: Diagnosis present

## 2014-04-17 DIAGNOSIS — G4733 Obstructive sleep apnea (adult) (pediatric): Secondary | ICD-10-CM | POA: Diagnosis present

## 2014-04-17 DIAGNOSIS — I252 Old myocardial infarction: Secondary | ICD-10-CM | POA: Diagnosis not present

## 2014-04-17 DIAGNOSIS — M25562 Pain in left knee: Secondary | ICD-10-CM | POA: Diagnosis present

## 2014-04-17 DIAGNOSIS — Z96651 Presence of right artificial knee joint: Secondary | ICD-10-CM | POA: Diagnosis present

## 2014-04-17 DIAGNOSIS — I2510 Atherosclerotic heart disease of native coronary artery without angina pectoris: Secondary | ICD-10-CM | POA: Diagnosis present

## 2014-04-17 DIAGNOSIS — Z96659 Presence of unspecified artificial knee joint: Secondary | ICD-10-CM

## 2014-04-17 DIAGNOSIS — G4730 Sleep apnea, unspecified: Secondary | ICD-10-CM | POA: Diagnosis present

## 2014-04-17 DIAGNOSIS — Z87891 Personal history of nicotine dependence: Secondary | ICD-10-CM

## 2014-04-17 DIAGNOSIS — Z96652 Presence of left artificial knee joint: Secondary | ICD-10-CM

## 2014-04-17 HISTORY — PX: TOTAL KNEE ARTHROPLASTY: SHX125

## 2014-04-17 LAB — CREATININE, SERUM
Creatinine, Ser: 1.16 mg/dL (ref 0.50–1.35)
GFR calc Af Amer: 67 mL/min — ABNORMAL LOW (ref >90)
GFR, EST NON AFRICAN AMERICAN: 58 mL/min — AB (ref >90)

## 2014-04-17 LAB — CBC
HCT: 40.2 % (ref 39.0–52.0)
Hemoglobin: 13.2 g/dL (ref 13.0–17.0)
MCH: 31.1 pg (ref 26.0–34.0)
MCHC: 32.8 g/dL (ref 30.0–36.0)
MCV: 94.8 fL (ref 78.0–100.0)
Platelets: 187 10*3/uL (ref 150–400)
RBC: 4.24 MIL/uL (ref 4.22–5.81)
RDW: 13.6 % (ref 11.5–15.5)
WBC: 10.9 10*3/uL — AB (ref 4.0–10.5)

## 2014-04-17 SURGERY — ARTHROPLASTY, KNEE, TOTAL
Anesthesia: General | Site: Knee | Laterality: Left

## 2014-04-17 MED ORDER — FERROUS SULFATE 325 (65 FE) MG PO TABS
325.0000 mg | ORAL_TABLET | Freq: Three times a day (TID) | ORAL | Status: DC
  Administered 2014-04-17 – 2014-04-22 (×12): 325 mg via ORAL
  Filled 2014-04-17 (×12): qty 1

## 2014-04-17 MED ORDER — SIMVASTATIN 40 MG PO TABS
40.0000 mg | ORAL_TABLET | Freq: Every day | ORAL | Status: DC
  Administered 2014-04-17 – 2014-04-21 (×5): 40 mg via ORAL
  Filled 2014-04-17 (×5): qty 1

## 2014-04-17 MED ORDER — FENTANYL CITRATE 0.05 MG/ML IJ SOLN
INTRAMUSCULAR | Status: AC
  Filled 2014-04-17: qty 2

## 2014-04-17 MED ORDER — OXYCODONE-ACETAMINOPHEN 5-325 MG PO TABS: 1.0000 | ORAL_TABLET | ORAL | Status: DC | PRN

## 2014-04-17 MED ORDER — NEOSTIGMINE METHYLSULFATE 10 MG/10ML IV SOLN
INTRAVENOUS | Status: DC | PRN
  Administered 2014-04-17: 3 mg via INTRAVENOUS

## 2014-04-17 MED ORDER — HYDROMORPHONE HCL 1 MG/ML IJ SOLN
1.0000 mg | INTRAMUSCULAR | Status: DC | PRN
  Administered 2014-04-17 – 2014-04-18 (×2): 1 mg via INTRAVENOUS
  Filled 2014-04-17 (×2): qty 1

## 2014-04-17 MED ORDER — ONDANSETRON HCL 4 MG/2ML IJ SOLN
INTRAMUSCULAR | Status: AC
  Filled 2014-04-17: qty 2

## 2014-04-17 MED ORDER — PROMETHAZINE HCL 25 MG/ML IJ SOLN
6.2500 mg | INTRAMUSCULAR | Status: DC | PRN
  Administered 2014-04-17: 12.5 mg via INTRAVENOUS

## 2014-04-17 MED ORDER — FENTANYL CITRATE 0.05 MG/ML IJ SOLN
50.0000 ug | INTRAMUSCULAR | Status: DC | PRN
  Administered 2014-04-17: 50 ug via INTRAVENOUS

## 2014-04-17 MED ORDER — DEXAMETHASONE SODIUM PHOSPHATE 4 MG/ML IJ SOLN
INTRAMUSCULAR | Status: AC
  Filled 2014-04-17: qty 2

## 2014-04-17 MED ORDER — ASPIRIN 325 MG PO TABS
325.0000 mg | ORAL_TABLET | Freq: Every day | ORAL | Status: DC
  Administered 2014-04-18 – 2014-04-22 (×5): 325 mg via ORAL
  Filled 2014-04-17 (×5): qty 1

## 2014-04-17 MED ORDER — LACTATED RINGERS IV SOLN
INTRAVENOUS | Status: DC | PRN
  Administered 2014-04-17 (×2): via INTRAVENOUS

## 2014-04-17 MED ORDER — ACETAMINOPHEN 325 MG PO TABS: 650.0000 mg | ORAL_TABLET | Freq: Four times a day (QID) | ORAL | Status: DC | PRN

## 2014-04-17 MED ORDER — PROPOFOL 10 MG/ML IV BOLUS
INTRAVENOUS | Status: DC | PRN
  Administered 2014-04-17: 150 mg via INTRAVENOUS

## 2014-04-17 MED ORDER — ROCURONIUM BROMIDE 100 MG/10ML IV SOLN
INTRAVENOUS | Status: DC | PRN
  Administered 2014-04-17: 40 mg via INTRAVENOUS

## 2014-04-17 MED ORDER — FENTANYL CITRATE 0.05 MG/ML IJ SOLN
INTRAMUSCULAR | Status: DC | PRN
  Administered 2014-04-17 (×2): 50 ug via INTRAVENOUS
  Administered 2014-04-17: 100 ug via INTRAVENOUS
  Administered 2014-04-17: 50 ug via INTRAVENOUS

## 2014-04-17 MED ORDER — SODIUM CHLORIDE 0.9 % IV SOLN: INTRAVENOUS | Status: DC

## 2014-04-17 MED ORDER — FENTANYL CITRATE 0.05 MG/ML IJ SOLN
INTRAMUSCULAR | Status: AC
  Filled 2014-04-17: qty 5

## 2014-04-17 MED ORDER — METOPROLOL TARTRATE 25 MG PO TABS
25.0000 mg | ORAL_TABLET | Freq: Two times a day (BID) | ORAL | Status: DC
  Administered 2014-04-17 – 2014-04-22 (×8): 25 mg via ORAL
  Filled 2014-04-17 (×3): qty 1
  Filled 2014-04-17 (×2): qty 2
  Filled 2014-04-17 (×4): qty 1

## 2014-04-17 MED ORDER — ONDANSETRON HCL 4 MG PO TABS: 4.0000 mg | ORAL_TABLET | Freq: Four times a day (QID) | ORAL | Status: DC | PRN

## 2014-04-17 MED ORDER — ENOXAPARIN SODIUM 40 MG/0.4ML ~~LOC~~ SOLN: 40.0000 mg | SUBCUTANEOUS | Status: DC

## 2014-04-17 MED ORDER — NEOSTIGMINE METHYLSULFATE 10 MG/10ML IV SOLN
INTRAVENOUS | Status: AC
  Filled 2014-04-17: qty 3

## 2014-04-17 MED ORDER — SODIUM CHLORIDE 0.9 % IR SOLN
Status: DC | PRN
  Administered 2014-04-17: 3000 mL

## 2014-04-17 MED ORDER — LACTATED RINGERS IV SOLN
INTRAVENOUS | Status: DC
  Administered 2014-04-17: 09:00:00 via INTRAVENOUS

## 2014-04-17 MED ORDER — CEFAZOLIN SODIUM-DEXTROSE 2-3 GM-% IV SOLR
2.0000 g | Freq: Four times a day (QID) | INTRAVENOUS | Status: AC
  Administered 2014-04-17 (×2): 2 g via INTRAVENOUS
  Filled 2014-04-17 (×2): qty 50

## 2014-04-17 MED ORDER — MIDAZOLAM HCL 2 MG/2ML IJ SOLN
INTRAMUSCULAR | Status: AC
  Administered 2014-04-17: 1 mg via INTRAVENOUS
  Filled 2014-04-17: qty 2

## 2014-04-17 MED ORDER — ENOXAPARIN SODIUM 30 MG/0.3ML ~~LOC~~ SOLN
30.0000 mg | Freq: Two times a day (BID) | SUBCUTANEOUS | Status: DC
  Administered 2014-04-18 – 2014-04-22 (×9): 30 mg via SUBCUTANEOUS
  Filled 2014-04-17 (×9): qty 0.3

## 2014-04-17 MED ORDER — PROPOFOL 10 MG/ML IV BOLUS
INTRAVENOUS | Status: AC
  Filled 2014-04-17: qty 20

## 2014-04-17 MED ORDER — SODIUM CHLORIDE 0.9 % IR SOLN
Status: DC | PRN
  Administered 2014-04-17: 1000 mL

## 2014-04-17 MED ORDER — METHOCARBAMOL 500 MG PO TABS
500.0000 mg | ORAL_TABLET | Freq: Four times a day (QID) | ORAL | Status: DC | PRN
  Administered 2014-04-17 – 2014-04-21 (×8): 500 mg via ORAL
  Filled 2014-04-17 (×9): qty 1

## 2014-04-17 MED ORDER — FENTANYL CITRATE 0.05 MG/ML IJ SOLN
25.0000 ug | INTRAMUSCULAR | Status: DC | PRN
  Administered 2014-04-17 (×3): 50 ug via INTRAVENOUS

## 2014-04-17 MED ORDER — LIDOCAINE HCL (CARDIAC) 20 MG/ML IV SOLN
INTRAVENOUS | Status: AC
  Filled 2014-04-17: qty 5

## 2014-04-17 MED ORDER — METHOCARBAMOL 500 MG PO TABS: 500.0000 mg | ORAL_TABLET | Freq: Three times a day (TID) | ORAL | Status: DC | PRN

## 2014-04-17 MED ORDER — ONDANSETRON HCL 4 MG/2ML IJ SOLN: 4.0000 mg | Freq: Four times a day (QID) | INTRAMUSCULAR | Status: DC | PRN

## 2014-04-17 MED ORDER — ROCURONIUM BROMIDE 50 MG/5ML IV SOLN
INTRAVENOUS | Status: AC
  Filled 2014-04-17: qty 1

## 2014-04-17 MED ORDER — METOCLOPRAMIDE HCL 5 MG PO TABS
5.0000 mg | ORAL_TABLET | Freq: Three times a day (TID) | ORAL | Status: DC | PRN
  Filled 2014-04-17: qty 2

## 2014-04-17 MED ORDER — MEPERIDINE HCL 25 MG/ML IJ SOLN: 6.2500 mg | INTRAMUSCULAR | Status: DC | PRN

## 2014-04-17 MED ORDER — DEXAMETHASONE SODIUM PHOSPHATE 4 MG/ML IJ SOLN
INTRAMUSCULAR | Status: DC | PRN
  Administered 2014-04-17: 8 mg via INTRAVENOUS

## 2014-04-17 MED ORDER — PROMETHAZINE HCL 25 MG/ML IJ SOLN
INTRAMUSCULAR | Status: AC
  Filled 2014-04-17: qty 1

## 2014-04-17 MED ORDER — FENTANYL CITRATE 0.05 MG/ML IJ SOLN
INTRAMUSCULAR | Status: AC
  Administered 2014-04-17: 50 ug via INTRAVENOUS
  Filled 2014-04-17: qty 2

## 2014-04-17 MED ORDER — POLYETHYLENE GLYCOL 3350 17 G PO PACK
17.0000 g | PACK | Freq: Every day | ORAL | Status: DC | PRN
  Administered 2014-04-19 – 2014-04-20 (×2): 17 g via ORAL
  Filled 2014-04-17 (×2): qty 1

## 2014-04-17 MED ORDER — GLYCOPYRROLATE 0.2 MG/ML IJ SOLN
INTRAMUSCULAR | Status: AC
  Filled 2014-04-17: qty 2

## 2014-04-17 MED ORDER — MENTHOL 3 MG MT LOZG: 1.0000 | LOZENGE | OROMUCOSAL | Status: DC | PRN

## 2014-04-17 MED ORDER — OXYCODONE HCL 5 MG PO TABS
5.0000 mg | ORAL_TABLET | ORAL | Status: DC | PRN
  Administered 2014-04-17: 5 mg via ORAL
  Administered 2014-04-18 (×3): 10 mg via ORAL
  Administered 2014-04-18: 5 mg via ORAL
  Administered 2014-04-19 – 2014-04-22 (×13): 10 mg via ORAL
  Filled 2014-04-17 (×2): qty 2
  Filled 2014-04-17: qty 1
  Filled 2014-04-17 (×4): qty 2
  Filled 2014-04-17: qty 1
  Filled 2014-04-17 (×10): qty 2

## 2014-04-17 MED ORDER — MIDAZOLAM HCL 2 MG/2ML IJ SOLN
INTRAMUSCULAR | Status: AC
  Filled 2014-04-17: qty 2

## 2014-04-17 MED ORDER — BUPIVACAINE-EPINEPHRINE (PF) 0.5% -1:200000 IJ SOLN
INTRAMUSCULAR | Status: DC | PRN
  Administered 2014-04-17: 30 mL via PERINEURAL

## 2014-04-17 MED ORDER — LISINOPRIL 5 MG PO TABS
5.0000 mg | ORAL_TABLET | Freq: Every day | ORAL | Status: DC
  Administered 2014-04-18 – 2014-04-22 (×3): 5 mg via ORAL
  Filled 2014-04-17 (×3): qty 1

## 2014-04-17 MED ORDER — MIDAZOLAM HCL 2 MG/2ML IJ SOLN
1.0000 mg | INTRAMUSCULAR | Status: DC | PRN
  Administered 2014-04-17: 1 mg via INTRAVENOUS

## 2014-04-17 MED ORDER — ACETAMINOPHEN 650 MG RE SUPP: 650.0000 mg | Freq: Four times a day (QID) | RECTAL | Status: DC | PRN

## 2014-04-17 MED ORDER — DEXTROSE 5 % IV SOLN
500.0000 mg | Freq: Four times a day (QID) | INTRAVENOUS | Status: DC | PRN
  Filled 2014-04-17: qty 5

## 2014-04-17 MED ORDER — LIDOCAINE HCL (CARDIAC) 20 MG/ML IV SOLN
INTRAVENOUS | Status: DC | PRN
  Administered 2014-04-17: 100 mg via INTRAVENOUS

## 2014-04-17 MED ORDER — GLYCOPYRROLATE 0.2 MG/ML IJ SOLN
INTRAMUSCULAR | Status: DC | PRN
  Administered 2014-04-17: 0.4 mg via INTRAVENOUS

## 2014-04-17 MED ORDER — ONDANSETRON HCL 4 MG/2ML IJ SOLN
INTRAMUSCULAR | Status: DC | PRN
  Administered 2014-04-17: 4 mg via INTRAVENOUS

## 2014-04-17 MED ORDER — PHENOL 1.4 % MT LIQD
1.0000 | OROMUCOSAL | Status: DC | PRN
Start: 2014-04-17 — End: 2014-04-22

## 2014-04-17 MED ORDER — METOCLOPRAMIDE HCL 5 MG/ML IJ SOLN
5.0000 mg | Freq: Three times a day (TID) | INTRAMUSCULAR | Status: DC | PRN
  Filled 2014-04-17: qty 2

## 2014-04-17 MED ORDER — METHOCARBAMOL 1000 MG/10ML IJ SOLN
500.0000 mg | INTRAVENOUS | Status: AC
  Administered 2014-04-17: 500 mg via INTRAVENOUS
  Filled 2014-04-17: qty 5

## 2014-04-17 MED ORDER — BISACODYL 10 MG RE SUPP: 10.0000 mg | Freq: Every day | RECTAL | Status: DC | PRN

## 2014-04-17 SURGICAL SUPPLY — 59 items
BANDAGE ELASTIC 6 VELCRO ST LF (GAUZE/BANDAGES/DRESSINGS) ×2 IMPLANT
BANDAGE ESMARK 6X9 LF (GAUZE/BANDAGES/DRESSINGS) ×1 IMPLANT
BLADE SAG 18X100X1.27 (BLADE) ×2 IMPLANT
BLADE SAW SGTL 13.0X1.19X90.0M (BLADE) ×2 IMPLANT
BNDG ELASTIC 6X10 VLCR STRL LF (GAUZE/BANDAGES/DRESSINGS) ×2 IMPLANT
BNDG ESMARK 6X9 LF (GAUZE/BANDAGES/DRESSINGS) ×2
BNDG GAUZE ELAST 4 BULKY (GAUZE/BANDAGES/DRESSINGS) ×2 IMPLANT
BOWL SMART MIX CTS (DISPOSABLE) ×2 IMPLANT
CAP KNEE TOTAL 3 SIGMA ×2 IMPLANT
CEMENT HV SMART SET (Cement) ×4 IMPLANT
CLSR STERI-STRIP ANTIMIC 1/2X4 (GAUZE/BANDAGES/DRESSINGS) ×2 IMPLANT
COVER SURGICAL LIGHT HANDLE (MISCELLANEOUS) ×2 IMPLANT
CUFF TOURNIQUET SINGLE 34IN LL (TOURNIQUET CUFF) ×2 IMPLANT
CUFF TOURNIQUET SINGLE 44IN (TOURNIQUET CUFF) IMPLANT
DRAPE EXTREMITY T 121X128X90 (DRAPE) ×2 IMPLANT
DRAPE IMP U-DRAPE 54X76 (DRAPES) ×2 IMPLANT
DRAPE PROXIMA HALF (DRAPES) ×2 IMPLANT
DRAPE U-SHAPE 47X51 STRL (DRAPES) ×2 IMPLANT
DRSG ADAPTIC 3X8 NADH LF (GAUZE/BANDAGES/DRESSINGS) ×2 IMPLANT
DRSG PAD ABDOMINAL 8X10 ST (GAUZE/BANDAGES/DRESSINGS) ×2 IMPLANT
DURAPREP 26ML APPLICATOR (WOUND CARE) ×2 IMPLANT
ELECT CAUTERY BLADE 6.4 (BLADE) ×2 IMPLANT
ELECT REM PT RETURN 9FT ADLT (ELECTROSURGICAL) ×2
ELECTRODE REM PT RTRN 9FT ADLT (ELECTROSURGICAL) ×1 IMPLANT
FIXATION PINS ×2 IMPLANT
GAUZE SPONGE 4X4 12PLY STRL (GAUZE/BANDAGES/DRESSINGS) ×2 IMPLANT
GLOVE BIOGEL PI ORTHO PRO 7.5 (GLOVE) ×1
GLOVE BIOGEL PI ORTHO PRO SZ8 (GLOVE) ×1
GLOVE ORTHO TXT STRL SZ7.5 (GLOVE) ×2 IMPLANT
GLOVE PI ORTHO PRO STRL 7.5 (GLOVE) ×1 IMPLANT
GLOVE PI ORTHO PRO STRL SZ8 (GLOVE) ×1 IMPLANT
GLOVE SURG ORTHO 8.5 STRL (GLOVE) ×2 IMPLANT
GOWN STRL REUS W/ TWL XL LVL3 (GOWN DISPOSABLE) ×3 IMPLANT
GOWN STRL REUS W/TWL XL LVL3 (GOWN DISPOSABLE) ×3
HANDPIECE INTERPULSE COAX TIP (DISPOSABLE) ×1
IMMOBILIZER KNEE 22 UNIV (SOFTGOODS) ×2 IMPLANT
KIT BASIN OR (CUSTOM PROCEDURE TRAY) ×2 IMPLANT
KIT MANIFOLD (MISCELLANEOUS) ×2 IMPLANT
KIT ROOM TURNOVER OR (KITS) ×2 IMPLANT
MANIFOLD NEPTUNE II (INSTRUMENTS) ×2 IMPLANT
NS IRRIG 1000ML POUR BTL (IV SOLUTION) ×2 IMPLANT
PACK TOTAL JOINT (CUSTOM PROCEDURE TRAY) ×2 IMPLANT
PACK UNIVERSAL I (CUSTOM PROCEDURE TRAY) ×2 IMPLANT
PAD ARMBOARD 7.5X6 YLW CONV (MISCELLANEOUS) ×4 IMPLANT
SET HNDPC FAN SPRY TIP SCT (DISPOSABLE) ×1 IMPLANT
STRIP CLOSURE SKIN 1/2X4 (GAUZE/BANDAGES/DRESSINGS) ×4 IMPLANT
SUCTION FRAZIER TIP 10 FR DISP (SUCTIONS) ×2 IMPLANT
SUT MNCRL AB 3-0 PS2 18 (SUTURE) ×2 IMPLANT
SUT VIC AB 0 CT1 27 (SUTURE) ×2
SUT VIC AB 0 CT1 27XBRD ANBCTR (SUTURE) ×2 IMPLANT
SUT VIC AB 1 CT1 27 (SUTURE) ×3
SUT VIC AB 1 CT1 27XBRD ANBCTR (SUTURE) ×3 IMPLANT
SUT VIC AB 2-0 CT1 27 (SUTURE) ×2
SUT VIC AB 2-0 CT1 TAPERPNT 27 (SUTURE) ×2 IMPLANT
TOWEL OR 17X24 6PK STRL BLUE (TOWEL DISPOSABLE) ×2 IMPLANT
TOWEL OR 17X26 10 PK STRL BLUE (TOWEL DISPOSABLE) ×2 IMPLANT
TRAY FOLEY CATH 16FRSI W/METER (SET/KITS/TRAYS/PACK) ×2 IMPLANT
UPCHARGE REV TRAY MBT KNEE ×2 IMPLANT
WATER STERILE IRR 1000ML POUR (IV SOLUTION) ×4 IMPLANT

## 2014-04-17 NOTE — Interval H&P Note (Signed)
History and Physical Interval Note:  04/17/2014 10:01 AM  Ricky Lee  has presented today for surgery, with the diagnosis of left knee osteoarthritis  The various methods of treatment have been discussed with the patient and family. After consideration of risks, benefits and other options for treatment, the patient has consented to  Procedure(s): LEFT TOTAL KNEE ARTHROPLASTY (Left) as a surgical intervention .  The patient's history has been reviewed, patient examined, no change in status, stable for surgery.  I have reviewed the patient's chart and labs.  Questions were answered to the patient's satisfaction.     Kahlee Metivier,STEVEN R

## 2014-04-17 NOTE — Transfer of Care (Signed)
Immediate Anesthesia Transfer of Care Note  Patient: Ricky Lee  Procedure(s) Performed: Procedure(s): LEFT TOTAL KNEE ARTHROPLASTY (Left)  Patient Location: PACU  Anesthesia Type:GA combined with regional for post-op pain  Level of Consciousness: awake, alert , oriented and patient cooperative  Airway & Oxygen Therapy: Patient Spontanous Breathing and Patient connected to nasal cannula oxygen  Post-op Assessment: Report given to RN and Post -op Vital signs reviewed and stable  Post vital signs: Reviewed and stable  Last Vitals:  Filed Vitals:   04/17/14 1005  BP: 132/75  Pulse: 84  Temp:   Resp: 15    Complications: No apparent anesthesia complications

## 2014-04-17 NOTE — Discharge Instructions (Signed)
Ice to the knee at all times  WBAT left knee  CPM 0-60 degrees  6 hours per day in two hour sessions.  Keep incision clean and dry and covered for one week, then ok to shower.  Do exercises every hour while awake.  Use the immobilizer at night for sleep to maintain extension.  Do not prop anything behind the knee.  Follow up in two weeks in the office [phone]

## 2014-04-17 NOTE — Progress Notes (Addendum)
PT Ricky Lee at 6:25 pm found orange blood band tightened on the right forearm. When removed, patient had red marking on right upper arm. Patient denied any pain or discomfort.  Patient stated that he had neuropathy on that hand for the last 10 years.  I called the lab and found that pt did have a lab draw done at 4:55 pm. I went back to reassess patient with Night shift RN, Ivin Booty. The skin mark got diminished slightly compared with the beginning when we found it. Will continue monitoring patient.

## 2014-04-17 NOTE — Evaluation (Signed)
Physical Therapy Evaluation Patient Details Name: Ricky Lee MRN: [id number] DOB: Feb 09, 1934 Today's Date: 04/17/2014   History of Present Illness  Patient is a 81 yo male admitted 04/17/14 now s/p Lt TKA.  PMH:  CAD, MI, HTN, OSA, arthritis, Rt TKA in 1999, Rt reverse TSA 2013  Clinical Impression  Patient presents with problems listed below.  Will benefit from acute PT to maximize independence prior to discharge home.  Patient's wife is in hospital, and daughter is here from [place] to help out.  Patient will need to progress to min guard/supervision level to return home with daughter caring for both parents. (If does not progress, may need to consider ST-SNF for continued therapy at discharge.)    Follow Up Recommendations Home health PT;Supervision/Assistance - 24 hour    Equipment Recommendations  None recommended by PT    Recommendations for Other Services       Precautions / Restrictions Precautions Precautions: Knee;Fall Precaution Booklet Issued: Yes (comment) Precaution Comments: Reviewed precautions with patient Required Braces or Orthoses: Knee Immobilizer - Left Knee Immobilizer - Left: On when out of bed or walking Restrictions Weight Bearing Restrictions: Yes LLE Weight Bearing: Weight bearing as tolerated      Mobility  Bed Mobility Overal bed mobility: Needs Assistance Bed Mobility: Supine to Sit;Sit to Supine     Supine to sit: Mod assist;HOB elevated Sit to supine: Mod assist;HOB elevated   General bed mobility comments: Verbal cues for technique.  Assist to bring LLE off of bed, and to raise trunk to sitting position.  Patient sat EOB x 8 minutes - lightheaded initially that resolved with time.  Required mod assist to bring LE's back onto bed to return to supine.  Transfers Overall transfer level: Needs assistance Equipment used: Rolling walker (2 wheeled) Transfers: Sit to/from Stand Sit to Stand: Mod assist;From elevated surface          General transfer comment: Verbal cues for hand placement and technique.  Mod assist to power up to standing - difficulty using RUE to push up.  Once standing, patient leaning/losing balance to left.  Patient stood x 3 minutes with assist for balance.  Returned to sitting with cues for LLE placement.  Ambulation/Gait                Stairs            Wheelchair Mobility    Modified Rankin (Stroke Patients Only)       Balance Overall balance assessment: Needs assistance         Standing balance support: Bilateral upper extremity supported Standing balance-Leahy Scale: Poor                               Pertinent Vitals/Pain Pain Assessment: 0-10 Pain Score: 5  Pain Location: Lt knee Pain Descriptors / Indicators: Aching;Sore Pain Intervention(s): Limited activity within patient's tolerance;Repositioned;Ice applied    Home Living Family/patient expects to be discharged to:: Private residence Living Arrangements: Spouse/significant other Available Help at Discharge: Family;Available 24 hours/day (Daughter here from [place]; Wife in hospital as well) Type of Home: House Home Access: Stairs to enter Entrance Stairs-Rails: Psychiatric nurse of Steps: 6 Home Layout: One level Home Equipment: Walker - 2 wheels;Bedside commode;Shower seat      Prior Function Level of Independence: Independent               Hand Dominance  Extremity/Trunk Assessment   Upper Extremity Assessment: Generalized weakness;RUE deficits/detail RUE Deficits / Details: Patient had multiple shoulder surgeries with reverse TSA in 2013.  Noted decreased ROM         Lower Extremity Assessment: LLE deficits/detail   LLE Deficits / Details: Decreased strength and ROM post-op     Communication   Communication: No difficulties  Cognition Arousal/Alertness: Awake/alert Behavior During Therapy: WFL for tasks assessed/performed Overall Cognitive  Status: Within Functional Limits for tasks assessed                      General Comments General comments (skin integrity, edema, etc.): As patient moved to sitting, noted rubber tourniquet on Rt upper arm.  Removed from arm.  Patient with reddened indentation around arm from band.  Patient noted slight "numbness" in Rt arm.  Notified RN and provided tourniquet.  RN reports she will f/u.    Exercises Total Joint Exercises Ankle Circles/Pumps: AROM;Both;10 reps;Supine      Assessment/Plan    PT Assessment Patient needs continued PT services  PT Diagnosis Difficulty walking;Generalized weakness;Acute pain   PT Problem List Decreased strength;Decreased range of motion;Decreased activity tolerance;Decreased balance;Decreased mobility;Decreased knowledge of use of DME;Decreased knowledge of precautions;Obesity;Pain  PT Treatment Interventions DME instruction;Gait training;Stair training;Functional mobility training;Therapeutic activities;Therapeutic exercise;Patient/family education   PT Goals (Current goals can be found in the Care Plan section) Acute Rehab PT Goals Patient Stated Goal: To go home at discharge PT Goal Formulation: With patient Time For Goal Achievement: 04/24/14 Potential to Achieve Goals: Good    Frequency 7X/week   Barriers to discharge Decreased caregiver support Patient's wife is in hospital at this time due to medical issue.  Daughter is here from [place] to stay with patient and wife at home.  Patient will need to function at min guard level to return home with family.    Co-evaluation               End of Session Equipment Utilized During Treatment: Gait belt;Left knee immobilizer;Oxygen Activity Tolerance: Patient limited by pain;Patient limited by fatigue Patient left: in bed;with call bell/phone within reach;with bed alarm set Nurse Communication: Mobility status (Rubber turniquet on Rt arm)         Time: 1314-3888 PT Time Calculation  (min) (ACUTE ONLY): 31 min   Charges:   PT Evaluation $Initial PT Evaluation Tier I: 1 Procedure PT Treatments $Therapeutic Activity: 8-22 mins   PT G Codes:        Despina Pole 2014/05/11, 7:42 PM Carita Pian. Sanjuana Kava, [place] Pager [phone]

## 2014-04-17 NOTE — Anesthesia Procedure Notes (Signed)
Anesthesia Regional Block:  Femoral nerve block  Pre-Anesthetic Checklist: ,, timeout performed, Correct Patient, Correct Site, Correct Laterality, Correct Procedure, Correct Position, site marked, Risks and benefits discussed,  Surgical consent,  Pre-op evaluation,  At surgeon's request and post-op pain management   Prep: Maximum Sterile Barrier Precautions used and chloraprep       Needles:  Injection technique: Single-shot  Needle Type: Echogenic Stimulator Needle     Needle Length: 10cm 10 cm Needle Gauge: 21 and 21 G    Additional Needles:  Procedures: ultrasound guided (picture in chart) and nerve stimulator Femoral nerve block  Nerve Stimulator or Paresthesia:  Response: 0.5 mA,   Additional Responses:   Narrative:  Start time: 04/17/2014 9:50 AM End time: 04/17/2014 10:00 AM Injection made incrementally with aspirations every 5 mL.  Performed by: Personally  Anesthesiologist: Alexis Frock  Additional Notes: L femoral nerve block, [place], stimulator, multiple asp, talked to patient throughout, no complications, 02OV .5% marcaine with epi

## 2014-04-17 NOTE — Anesthesia Preprocedure Evaluation (Addendum)
Anesthesia Evaluation  Patient identified by MRN, date of birth, ID band Patient awake    Reviewed: Allergy & Precautions, NPO status , Patient's Chart, lab work & pertinent test results, reviewed documented beta blocker date and time   Airway Mallampati: II   Neck ROM: Full    Dental  (+) Edentulous Upper, Dental Advisory Given   Pulmonary sleep apnea , former smoker (quit 1984 58 pack year),  breath sounds clear to auscultation        Cardiovascular hypertension, Pt. on medications + CAD and + Past MI Rhythm:Regular  EF 672%, cleared cardio, normal STRESS 06/2013   Neuro/Psych    GI/Hepatic negative GI ROS, Neg liver ROS,   Endo/Other    Renal/GU GFR 76     Musculoskeletal   Abdominal (+) + obese,   Peds  Hematology   Anesthesia Other Findings   Reproductive/Obstetrics                            Anesthesia Physical Anesthesia Plan  ASA: III  Anesthesia Plan: General   Post-op Pain Management: MAC Combined w/ Regional for Post-op pain   Induction: Intravenous  Airway Management Planned: Oral ETT  Additional Equipment:   Intra-op Plan:   Post-operative Plan: Extubation in OR  Informed Consent: I have reviewed the patients History and Physical, chart, labs and discussed the procedure including the risks, benefits and alternatives for the proposed anesthesia with the patient or authorized representative who has indicated his/her understanding and acceptance.     Plan Discussed with:   Anesthesia Plan Comments:         Anesthesia Quick Evaluation

## 2014-04-17 NOTE — Anesthesia Postprocedure Evaluation (Signed)
  Anesthesia Post-op Note  Patient: Ricky Lee  Procedure(s) Performed: Procedure(s): LEFT TOTAL KNEE ARTHROPLASTY (Left)  Patient Location: PACU  Anesthesia Type:General  Level of Consciousness: awake and alert   Airway and Oxygen Therapy: Patient Spontanous Breathing and Patient connected to nasal cannula oxygen  Post-op Pain: moderate  Post-op Assessment: Post-op Vital signs reviewed, Patient's Cardiovascular Status Stable, Patent Airway and No signs of Nausea or vomiting  Post-op Vital Signs: Reviewed and stable  Last Vitals:  Filed Vitals:   04/17/14 1243  BP: 120/79  Pulse: 95  Temp: 36.5 C  Resp: 23    Complications: No apparent anesthesia complications

## 2014-04-17 NOTE — Brief Op Note (Signed)
04/17/2014  12:50 PM  PATIENT:  Ricky Lee  81 y.o. male  PRE-OPERATIVE DIAGNOSIS:  left knee osteoarthritis, end stage  POST-OPERATIVE DIAGNOSIS:  left knee osteoarthritis, end stage  PROCEDURE:  Procedure(s): LEFT TOTAL KNEE ARTHROPLASTY (Left) DePuy Sigma RP   SURGEON:  Surgeon(s) and Role:    * Augustin Schooling, MD - Primary  PHYSICIAN ASSISTANT:   ASSISTANTS: Ventura Bruns, PA-C   ANESTHESIA:   regional and general  EBL:  Total I/O In: 1400 [I.V.:1400] Out: 140 [Urine:100; Blood:40]  BLOOD ADMINISTERED:none  DRAINS: none   LOCAL MEDICATIONS USED:  NONE  SPECIMEN:  No Specimen  DISPOSITION OF SPECIMEN:  N/A  COUNTS:  YES  TOURNIQUET:   Total Tourniquet Time Documented: Thigh (Left) - 101 minutes Total: Thigh (Left) - 101 minutes   DICTATION: .Other Dictation: Dictation Number [id number]  PLAN OF CARE: Admit to inpatient   PATIENT DISPOSITION:  PACU - hemodynamically stable.   Delay start of Pharmacological VTE agent (>24hrs) due to surgical blood loss or risk of bleeding: no

## 2014-04-17 NOTE — Progress Notes (Signed)
Utilization review completed.  

## 2014-04-18 ENCOUNTER — Encounter (HOSPITAL_COMMUNITY): Payer: Self-pay | Admitting: Orthopedic Surgery

## 2014-04-18 LAB — CBC
HEMATOCRIT: 37.1 % — AB (ref 39.0–52.0)
HEMOGLOBIN: 12 g/dL — AB (ref 13.0–17.0)
MCH: 30.8 pg (ref 26.0–34.0)
MCHC: 32.3 g/dL (ref 30.0–36.0)
MCV: 95.4 fL (ref 78.0–100.0)
Platelets: 186 10*3/uL (ref 150–400)
RBC: 3.89 MIL/uL — AB (ref 4.22–5.81)
RDW: 13.5 % (ref 11.5–15.5)
WBC: 11.7 10*3/uL — AB (ref 4.0–10.5)

## 2014-04-18 LAB — BASIC METABOLIC PANEL
ANION GAP: 5 (ref 5–15)
BUN: 18 mg/dL (ref 6–23)
CHLORIDE: 101 mmol/L (ref 96–112)
CO2: 30 mmol/L (ref 19–32)
CREATININE: 1.22 mg/dL (ref 0.50–1.35)
Calcium: 8.9 mg/dL (ref 8.4–10.5)
GFR calc Af Amer: 63 mL/min — ABNORMAL LOW (ref >90)
GFR, EST NON AFRICAN AMERICAN: 55 mL/min — AB (ref >90)
Glucose, Bld: 128 mg/dL — ABNORMAL HIGH (ref 70–99)
Potassium: 4.8 mmol/L (ref 3.5–5.1)
SODIUM: 136 mmol/L (ref 135–145)

## 2014-04-18 NOTE — Progress Notes (Signed)
Physical Therapy Treatment Patient Details Name: Ricky Lee MRN: [id number] DOB: April 13, 1933 Today's Date: 04/18/2014    History of Present Illness Patient is a 81 yo male admitted 04/17/14 now s/p Lt TKA.  PMH:  CAD, MI, HTN, OSA, arthritis, Rt TKA in 1999, Rt reverse TSA 2013    PT Comments    Pt able to ambulate ~15' x 2 today but required mod assist + 2 for safety.  Pt unsteady & Lt knee buckling during stance phase at times.  Pt's daughter present entire session.  She expresses concern about pt d/cing home within next 1-2 days due to she will have to return back to [place] on Wednesday.  She states pt's spouse is in hospital & having surgery Monday or Tuesday and she isn't even sure of the d/c plan for her at this time.  Daughter states pt needs to be independent prior to d/c home due to insufficient assistance available.  Will continue to follow & make d/c changes if needed.     Follow Up Recommendations  Home health PT;Supervision/Assistance - 24 hour;SNF     Equipment Recommendations  None recommended by PT    Recommendations for Other Services       Precautions / Restrictions Precautions Precautions: Knee;Fall Precaution Comments: Reviewed precautions with patient Required Braces or Orthoses: Knee Immobilizer - Left Knee Immobilizer - Left: On when out of bed or walking Restrictions LLE Weight Bearing: Weight bearing as tolerated    Mobility  Bed Mobility Overal bed mobility: Needs Assistance Bed Mobility: Supine to Sit     Supine to sit: Min assist     General bed mobility comments: pt sitting in recliner upon arrival  Transfers Overall transfer level: Needs assistance Equipment used: Rolling walker (2 wheeled) Transfers: Sit to/from Stand Sit to Stand: Min assist;+2 safety/equipment Stand pivot transfers: Min assist;+2 safety/equipment       General transfer comment: cues for hand placement.  (A) to achieve standing &  balance  Ambulation/Gait Ambulation/Gait assistance: Mod assist;+2 physical assistance Ambulation Distance (Feet): 30 Feet (15' + 15' ) Assistive device: Rolling walker (2 wheeled) Gait Pattern/deviations: Step-to pattern;Decreased step length - right;Decreased step length - left;Decreased weight shift to left;Antalgic     General Gait Details: assist for steadiness & safety as pt with decreased balance & Lt knee buckling during stance phase.  Max cues for use of UE's to assist with supporting body during LLE stance phase.  Pt has diffculty clearing floor with RLE due to weakness & pain LLE.     Stairs            Wheelchair Mobility    Modified Rankin (Stroke Patients Only)       Balance           Standing balance support: Bilateral upper extremity supported Standing balance-Leahy Scale: Fair                      Cognition Arousal/Alertness: Awake/alert Behavior During Therapy: WFL for tasks assessed/performed Overall Cognitive Status: Within Functional Limits for tasks assessed                      Exercises Total Joint Exercises Ankle Circles/Pumps: AROM;Both;10 reps Quad Sets: AROM;Strengthening;Both;10 reps Hip ABduction/ADduction: AAROM;Strengthening;Left;10 reps Straight Leg Raises: AAROM;Strengthening;Left;10 reps    General Comments        Pertinent Vitals/Pain Pain Assessment: 0-10 Pain Score: 2  Pain Location: lt knee Pain Descriptors / Indicators: Discomfort Pain  Intervention(s): Monitored during session;Repositioned    Home Living                      Prior Function            PT Goals (current goals can now be found in the care plan section) Acute Rehab PT Goals Patient Stated Goal: To go home at discharge PT Goal Formulation: With patient Time For Goal Achievement: 04/24/14 Potential to Achieve Goals: Good Progress towards PT goals: Progressing toward goals    Frequency  7X/week    PT Plan Current  plan remains appropriate    Co-evaluation             End of Session Equipment Utilized During Treatment: Gait belt;Left knee immobilizer Activity Tolerance: Patient tolerated treatment well Patient left: in chair;with call bell/phone within reach;with family/visitor present     Time: 4917-9150 PT Time Calculation (min) (ACUTE ONLY): 31 min  Charges:  $Gait Training: 8-22 mins $Therapeutic Exercise: 8-22 mins $Therapeutic Activity: 8-22 mins                    G Codes:      [name] 04/18/2014, 2:27 PM   [name], [place] [phone] 04/18/2014

## 2014-04-18 NOTE — Progress Notes (Signed)
Orthopedic Tech Progress Note Patient Details:  Ricky Lee Apr 14, 1933 [id number]  CPM Left Knee CPM Left Knee: On Left Knee Flexion (Degrees): 60 Left Knee Extension (Degrees): 0   Braulio Bosch 04/18/2014, 5:10 PM

## 2014-04-18 NOTE — Progress Notes (Signed)
Patient ID: Ricky Lee, male   DOB: [birth year], 81 y.o.   MRN: [id number] Subjective: 1 Day Post-Op Procedure(s) (LRB): LEFT TOTAL KNEE ARTHROPLASTY (Left)    Patient reports pain as mild to moderate this am.  Already had breakfast this am.  No events reported  Objective:   VITALS:   Filed Vitals:   04/18/14 0503  BP: 123/76  Pulse: 93  Temp: 97.5 F (36.4 C)  Resp: 16    Neurovascular intact Incision: dressing C/D/I  LABS  Recent Labs  04/17/14 1655  HGB 13.2  HCT 40.2  WBC 10.9*  PLT 187     Recent Labs  04/17/14 1655  CREATININE 1.16    No results for input(s): LABPT, INR in the last 72 hours.   Assessment/Plan: 1 Day Post-Op Procedure(s) (LRB): LEFT TOTAL KNEE ARTHROPLASTY (Left)   Up with therapy Discharge home with home health tomorrow versus Monday depending on progress with therapy Had right knee replaced in '99 Reviewed short term goals for function and motion

## 2014-04-18 NOTE — Op Note (Signed)
NAMEJOSIP, Lee NO.:  [id number]  MEDICAL RECORD NO.:  [id number]  LOCATION:  5N06C                        FACILITY:  [facility]  PHYSICIAN:  [name]. [name], M.D. DATE OF BIRTH:  1933/10/26  DATE OF PROCEDURE:  04/17/2014 DATE OF DISCHARGE:                              OPERATIVE REPORT   PREOPERATIVE DIAGNOSIS:  Left knee end-stage arthritis.  POSTOPERATIVE DIAGNOSIS:  Left knee end-stage arthritis.  PROCEDURE PERFORMED:  Left total knee replacement using DePuy Sigma rotating platform prosthesis with MBT revision tray.  ATTENDING SURGEON:  [name]. [name], M.D.  ASSISTANT:  Abbott Pao. Dixon, PA-C, who was scrubbed the entire procedure and necessary for satisfactory completion of surgery.  ANESTHESIA:  General anesthesia was used plus femoral nerve block.  ESTIMATED BLOOD LOSS:  Minimal.  FLUID REPLACEMENT:  1200 mL crystalloid.  INSTRUMENT COUNTS:  Correct.  COMPLICATIONS:  There were no complications.  ANTIBIOTICS:  Perioperative antibiotics were given.  INDICATIONS:  The patient is a 81 year old male with worsening left knee pain secondary to end-stage arthritis.  The patient has bone-on-bone arthritis on x-ray and has had progressive arthritic pain causing pain at rest, pain at night, and functional limitations.  The patient has had a prior right total knee replacement and has done well with that.  He desires left total knee arthroplasty.  Risks and benefits were discussed.  Informed consent obtained.  DESCRIPTION OF PROCEDURE:  After an adequate level of anesthesia achieved, the patient was positioned in the supine position.  Left leg correctly identified.  Nonsterile tourniquet placed on the proximal thigh.  Right leg was padded and secured to the OR table.  After sterile prep and drape of the knee, time-out was called.  We then elevated and exsanguinated the left leg using the Esmarch bandage.  We elevated the tourniquet to 350 mmHg.  We  then flexed the knee, and a longitudinal midline incision was created with the knee in flexion.  Dissection down to subcutaneous tissues.  Median parapatellar arthrotomy created using a fresh 10 blade scalpel.  We divided the lateral patellofemoral ligaments and everted the patella.  Next, we went ahead and entered the distal femur with a step-cut drill and then placed our intramedullary resection guide, set on 10 mm resection 5 degrees left.  We used an oscillating saw and made that cut.  We then sized the femur to size 4 anterior down and placed our 4-in-1 block and resected anterior, posterior, and chamfer cuts.  ACL, PCL meniscal tissue removed.  Tibia subluxed anteriorly and then a 2-degree posterior slope placed on a 9-degree perpendicular cut to the tibia.  Once we had that cut with the oscillating saw, we sized the tibia to size 5, and then did our remaining preparation with the modular drill and keel punch for the MBT revision tray.  We placed that trial tray in place and then went ahead and just before placing the tray, we used a lamina spreader and then removed excess posterior bone off the posterior femoral condyles.  We then placed our tibial tray in place, then used the box cutting guide to resect the box for the posterior cruciate substituting prosthesis.  This is the  DePuy Sigma RP system.  We then went ahead and irrigated thoroughly, placed a size 15 and 17.5 poly insert and reduced the knee. We felt like, we probably still good at 20 insert and despite only resecting 2 mm off the affected medial aspect of the knee.  The patient's MCL and LCL were competent.  We then went ahead and placing in extension, resurfaced the patella going from a 27 mm thickness down to a 17 mm thickness and then drilling for the 38 patellar button.  We reduced the knee and then ranged the patella.  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  Once we had the 38 patellar button in place, we ranged the knee and had excellent patellar tracking with no-touch technique.  At this point, we removed all trial components, pulse irrigated the knee, dried thoroughly, and then cemented the components into place with DePuy high viscosity cement.  Once the cement was in place and the components in place, we placed the knee in extension with a size 20 polyethylene trial insert and that gave [place] excellent extension, and we allowed the cement to harden in extension and used a patellar clamp on the patella.  We removed excess cement using quarter-inch curved osteotome.  We inspected the posterior aspect of the knee, trialed again, we had nice patellar tracking.  We had full extension and good stability both in flexion and extension.  We removed the trial 20 and selected real size for 20 polyethylene insert and popped that in position.  We had a nice snap on the medial side of the knee.  Again, nice stability both in flexion and extension.  We thoroughly irrigated with pulse, irrigated and repaired arthrotomy with #1 Vicryl suture interrupted followed by 2-0 Vicryl subcutaneous closure and 4-0 Monocryl for skin.  Steri-Strips and sterile dressing were applied.  The patient tolerated the surgery well.     [name]. [name], M.D.     SRN/MEDQ  D:  04/17/2014  T:  04/18/2014  Job:  [id number]

## 2014-04-18 NOTE — Evaluation (Signed)
Occupational Therapy Evaluation Patient Details Name: Ricky Lee MRN: [id number] DOB: [birth year] Today's Date: 04/18/2014    History of Present Illness Patient is a 81 yo male admitted 04/17/14 now s/p Lt TKA.  PMH:  CAD, MI, HTN, OSA, arthritis, Rt TKA in 1999, Rt reverse TSA 2013   Clinical Impression   Pt admitted with the above diagnoses and presents with below problem list. Pt will benefit from continued acute OT to address the below listed deficits and maximize independence with basic ADLs prior to d/c to next venue. PTA pt was independent with ADLs. Pt currently at +2 min A for safety level with LB ADLs, transfers, and functional mobility. Chairside eval only this session due to unavailability of +2 assist. Discussed SNF vs. Home d/c options with pt. Recommend SNF due to +2 assist level per PT note. Pt will have +1 assist at home. OT to continue to follow acutely.    Follow Up Recommendations  SNF    Equipment Recommendations  Other (comment) (defer to next venue or of d/c plan changes)    Recommendations for Other Services       Precautions / Restrictions Precautions Precautions: Knee;Fall Precaution Comments: Reviewed precautions with patient Required Braces or Orthoses: Knee Immobilizer - Left Knee Immobilizer - Left: On when out of bed or walking Restrictions Weight Bearing Restrictions: Yes LLE Weight Bearing: Weight bearing as tolerated      Mobility Bed Mobility               General bed mobility comments: in recliner; able to scoot back in recliner without assist  Transfers    Balance            r                              ADL Overall ADL's : Needs assistance/impaired Eating/Feeding: Set up;Sitting   Grooming: Set up;Sitting   Upper Body Bathing: Set up;Sitting   Lower Body Bathing: +2 for safety/equipment;Moderate assistance;Sit to/from stand   Upper Body Dressing : Set up;Sitting   Lower Body Dressing: Moderate  assistance;+2 for safety/equipment;Sit to/from stand   Toilet Transfer: Minimal assistance;+2 for safety/equipment;Ambulation;BSC;RW   Toileting- Clothing Manipulation and Hygiene: Minimal assistance;+2 for safety/equipment;Sit to/from stand   Tub/ Shower Transfer: Minimal assistance;+2 for physical assistance;3 in 1;Rolling walker   Functional mobility during ADLs: Minimal assistance;+2 for physical assistance General ADL Comments: Chair side eval this session due to unavailability of +2 assist. ADL levels based on clinical judgement and PT notes. ADL education provided. Discussed options for d/c (SNf vs. home with 24 hour assist)     Vision     Perception     Praxis      Pertinent Vitals/Pain Pain Assessment: 0-10 Pain Score: 5  Pain Location: Lt knee Pain Descriptors / Indicators: Aching Pain Intervention(s): Monitored during session     Hand Dominance Right   Extremity/Trunk Assessment Upper Extremity Assessment Upper Extremity Assessment: Generalized weakness;Overall [facility] for tasks assessed   Lower Extremity Assessment Lower Extremity Assessment: Defer to PT evaluation       Communication Communication Communication: No difficulties   Cognition Arousal/Alertness: Awake/alert Behavior During Therapy: WFL for tasks assessed/performed Overall Cognitive Status: Within Functional Limits for tasks assessed                     General Comments       Exercises  Shoulder Instructions      Home Living Family/patient expects to be discharged to:: Unsure Living Arrangements: Spouse/significant other Available Help at Discharge: Family;Available 24 hours/day Type of Home: House Home Access: Stairs to enter [employer] of Steps: 6 Entrance Stairs-Rails: Right;Left Home Layout: One level               Home Equipment: Walker - 2 wheels;Bedside commode;Shower seat   Additional Comments: pt reports daughter will be with him "for as  long as I need"; wife currently in [facility] on 6th floor      Prior Functioning/Environment Level of Independence: Independent             OT Diagnosis: Acute pain;Generalized weakness   OT Problem List: Decreased strength;Decreased activity tolerance;Impaired balance (sitting and/or standing);Decreased knowledge of use of DME or AE;Decreased knowledge of precautions;Obesity;Pain   OT Treatment/Interventions: Self-care/ADL training;Energy conservation;DME and/or AE instruction;Therapeutic activities;Patient/family education;Balance training    OT Goals(Current goals can be found in the care plan section) Acute Rehab OT Goals Patient Stated Goal: to be able to cut grass this summer OT Goal Formulation: With patient Time For Goal Achievement: 04/25/14 Potential to Achieve Goals: Good ADL Goals Pt Will Perform Lower Body Bathing: with min guard assist;sit to/from stand Pt Will Perform Lower Body Dressing: with min guard assist;with adaptive equipment;sit to/from stand Pt Will Transfer to Toilet: with min guard assist;ambulating (3n1 over toilet) Pt Will Perform Toileting - Clothing Manipulation and hygiene: with min guard assist;sit to/from stand Pt Will Perform Tub/Shower Transfer: with min guard assist;ambulating;3 in 1;rolling walker  OT Frequency: Min 3X/week   Barriers to D/C: Decreased caregiver support          Co-evaluation              End of Session Equipment Utilized During Treatment: Left knee immobilizer  Activity Tolerance:   Patient left: in chair;with call bell/phone within reach   Time: 2831-5176 OT Time Calculation (min): 14 min Charges:  OT General Charges $OT Visit: 1 Procedure OT Evaluation $Initial OT Evaluation Tier I: 1 Procedure G-Codes:    Hortencia Pilar 05/13/14, 4:12 PM

## 2014-04-18 NOTE — Care Management Note (Addendum)
CARE MANAGEMENT NOTE 04/18/2014  Patient:  Ricky Lee, Ricky Lee   Account Number:  [id number]  Date Initiated:  04/18/2014  Documentation initiated by:  Oliveras-Aizpurua,Darryll Raju  Subjective/Objective Assessment:   81 yo male underwent L TKA.     Action/Plan:   D/C pan   Anticipated DC Date:  04/20/2014   Anticipated DC Plan:  Grant referral  Clinical Social Worker      DC Planning Services  CM consult      Choice offered to / List presented to:             Status of service:  In process, will continue to follow Medicare Important Message given?   (If response is "NO", the following Medicare IM given date fields will be blank) Date Medicare IM given:   Medicare IM given by:   Date Additional Medicare IM given:   Additional Medicare IM given by:    Discharge Disposition:    Per UR Regulation:    If discussed at Long Length of Stay Meetings, dates discussed:    Comments:  04/18/2014 1330 - Frann Rider, RN, BSN  Daughter requesting to speak with SW. Met with pt and daughter. Pt's wife is inpt with an obstruction and she may need surgery. Daughter is concern about the d/c plan since her mom is in the hospital. She lives in [place]. Explained PT recommendations. He needs 24 hr supervision. He was only able to tolerate bed to chair transfer due to pain. Pt stated that he is not going to SNF. Daughter reports that she is planning to help her father, but she is concern since her mom is also in the hospital. Explained SW referral. Contacted SW and left a VM regarding daughter's request to speak to a SW. Daughter's cell phone [phone]. Will continue to f/u.

## 2014-04-18 NOTE — Progress Notes (Signed)
Physical Therapy Treatment Patient Details Name: Ricky Lee MRN: [id number] DOB: 08/30/1933 Today's Date: 04/18/2014    History of Present Illness Patient is a 81 yo male admitted 04/17/14 now s/p Lt TKA.  PMH:  CAD, MI, HTN, OSA, arthritis, Rt TKA in 1999, Rt reverse TSA 2013    PT Comments    Pt pleasant & willing to participate in PT session.  Pt reports pain moderate to severe.  Notified RN for pain medication.  Pt only able to tolerate bed>chair transfer due to knee pain.  Cont with POC.  If pt does not progress to min guard/supervision d/c plans will need to be updated to SNF per PT eval note.      Follow Up Recommendations  Home health PT;Supervision/Assistance - 24 hour     Equipment Recommendations  None recommended by PT    Recommendations for Other Services       Precautions / Restrictions Precautions Precautions: Knee;Fall Precaution Comments: Reviewed precautions with patient Required Braces or Orthoses: Knee Immobilizer - Left Knee Immobilizer - Left: On when out of bed or walking Restrictions Weight Bearing Restrictions: Yes LLE Weight Bearing: Weight bearing as tolerated    Mobility  Bed Mobility Overal bed mobility: Needs Assistance Bed Mobility: Supine to Sit     Supine to sit: Min assist     General bed mobility comments: (A) for LLE management.  Cues for technique.  HOB flat without rails.    Transfers Overall transfer level: Needs assistance Equipment used: Rolling walker (2 wheeled) Transfers: Sit to/from [facility] Sit to Stand: +2 physical assistance;Mod assist Stand pivot transfers: Min assist;+2 safety/equipment       General transfer comment: Cues for hand placement & technique.  (A) to power up to standing & initial balance while getting LLE positioned within BOS.  Pt able to take pivotal shuffling steps from bed>recliner but extreme difficulty clearing floor with RLE due to Lt knee pain.    Ambulation/Gait                 Stairs            Wheelchair Mobility    Modified Rankin (Stroke Patients Only)       Balance                                    Cognition Arousal/Alertness: Awake/alert Behavior During Therapy: WFL for tasks assessed/performed Overall Cognitive Status: Within Functional Limits for tasks assessed                      Exercises Total Joint Exercises Ankle Circles/Pumps: AROM;Both;10 reps Quad Sets: AROM;Strengthening;Both;10 reps Hip ABduction/ADduction: AAROM;Strengthening;Left;10 reps Straight Leg Raises: AAROM;Strengthening;Left;10 reps    General Comments        Pertinent Vitals/Pain Pain Assessment: 0-10 Pain Score: 7  Pain Location: lt knee Pain Descriptors / Indicators: Aching Pain Intervention(s): Limited activity within patient's tolerance;Monitored during session;Repositioned;Ice applied;Other (comment) (RN notified & administered pain meds at end of session)    Home Living                      Prior Function            PT Goals (current goals can now be found in the care plan section) Acute Rehab PT Goals Patient Stated Goal: To go home at discharge PT Goal Formulation: With patient  Time For Goal Achievement: 04/24/14 Potential to Achieve Goals: Good Progress towards PT goals: Progressing toward goals (slowly)    Frequency  7X/week    PT Plan Current plan remains appropriate    Co-evaluation             End of Session Equipment Utilized During Treatment: Gait belt;Left knee immobilizer Activity Tolerance: Patient limited by pain Patient left: in chair;with call bell/phone within reach     Time: 0809-0833 PT Time Calculation (min) (ACUTE ONLY): 24 min  Charges:  $Therapeutic Exercise: 8-22 mins $Therapeutic Activity: 8-22 mins                    G Codes:      [name] 04/18/2014, 11:44 AM   [name], PTA [phone] 04/18/2014

## 2014-04-19 LAB — CBC
HCT: 32.2 % — ABNORMAL LOW (ref 39.0–52.0)
HEMOGLOBIN: 11 g/dL — AB (ref 13.0–17.0)
MCH: 32.4 pg (ref 26.0–34.0)
MCHC: 34.2 g/dL (ref 30.0–36.0)
MCV: 95 fL (ref 78.0–100.0)
PLATELETS: 145 10*3/uL — AB (ref 150–400)
RBC: 3.39 MIL/uL — AB (ref 4.22–5.81)
RDW: 13.8 % (ref 11.5–15.5)
WBC: 11.1 10*3/uL — ABNORMAL HIGH (ref 4.0–10.5)

## 2014-04-19 NOTE — Progress Notes (Signed)
Physical Therapy Treatment Patient Details Name: Ricky Lee MRN: [id number] DOB: November 24, 1933 Today's Date: 04/19/2014    History of Present Illness Patient is a 81 yo male admitted 04/17/14 now s/p Lt TKA.  PMH:  CAD, MI, HTN, OSA, arthritis, Rt TKA in 1999, Rt reverse TSA 2013    PT Comments    Pt progressing slowly with mobility.  Cont's to require min assist + 2 for safety with OOB mobility.  Pt required constant min assist for balance with ambulation this session due to leaning to Rt.  Pt very eager to d/c home & resistant against SNF.  Pt has 5-6 steps to enter house and have not been able to attempt stair training at this time due to difficulty with walking.  Daughter continues to express great concern about him d/cing home rather than SNF due to his wife's medical condition & requiring assistance as well.     Follow Up Recommendations  SNF;Home health PT;Supervision/Assistance - 24 hour     Equipment Recommendations  None recommended by PT    Recommendations for Other Services       Precautions / Restrictions Precautions Precautions: Knee;Fall Precaution Comments: Reviewed precautions with patient Required Braces or Orthoses: Knee Immobilizer - Left Knee Immobilizer - Left: On when out of bed or walking Restrictions LLE Weight Bearing: Weight bearing as tolerated    Mobility  Bed Mobility               General bed mobility comments: Pt sitting in recliner upon arrival  Transfers Overall transfer level: Needs assistance Equipment used: Rolling walker (2 wheeled) Transfers: Sit to/from Stand Sit to Stand: Min assist;Mod assist         General transfer comment: cues for hand placement.  1st trial pt able to achieve standing from recliner with min assist but 2nd & 3rd trial pt required mod assist to achieve standing from recliner.    Ambulation/Gait Ambulation/Gait assistance: Min assist;+2 safety/equipment Ambulation Distance (Feet): 30 Feet (15' + 15'  ) Assistive device: Rolling walker (2 wheeled) Gait Pattern/deviations: Step-to pattern;Decreased weight shift to left;Decreased step length - right;Decreased step length - left Gait velocity: decreased   General Gait Details: (A) for balance & safety.  Max cues for increase weight shifting to Lt during stance for sufficient step with Rt foot.  Pt with heavy lean to Rt requiring constant min assist for balance.     Stairs            Wheelchair Mobility    Modified Rankin (Stroke Patients Only)       Balance                                    Cognition Arousal/Alertness: Awake/alert Behavior During Therapy: WFL for tasks assessed/performed Overall Cognitive Status: Within Functional Limits for tasks assessed                      Exercises      General Comments        Pertinent Vitals/Pain Pain Assessment: 0-10 Pain Score: 3  Pain Location: Lt knee Pain Descriptors / Indicators: Discomfort Pain Intervention(s): Monitored during session;Repositioned;Premedicated before session    Home Living                      Prior Function            PT  Goals (current goals can now be found in the care plan section) Acute Rehab PT Goals Patient Stated Goal: To go home PT Goal Formulation: With patient Time For Goal Achievement: 04/24/14 Potential to Achieve Goals: Good Progress towards PT goals: Progressing toward goals    Frequency  7X/week    PT Plan      Co-evaluation             End of Session Equipment Utilized During Treatment: Gait belt;Left knee immobilizer Activity Tolerance: Patient tolerated treatment well Patient left: in chair;with call bell/phone within reach;with family/visitor present     Time: 1300-1330 PT Time Calculation (min) (ACUTE ONLY): 30 min  Charges:  $Gait Training: 8-22 mins $Therapeutic Activity: 8-22 mins                    G Codes:      [name] 04/19/2014, 1:38  PM   [name], PTA [phone] 04/19/2014

## 2014-04-19 NOTE — Progress Notes (Signed)
Subjective: 2 Days Post-Op Procedure(s) (LRB): LEFT TOTAL KNEE ARTHROPLASTY (Left) Patient reports pain as moderate to left knee.  Medications are controlling pain. Tolerating PO's. Slow progress with PT. Denies any complications. No SOB, CP, or calf pain reported.  Objective: Vital signs in last 24 hours: Temp:  [97.6 F (36.4 C)-100 F (37.8 C)] 98.3 F (36.8 C) (02/21 0544) Pulse Rate:  [86-111] 102 (02/21 0544) Resp:  [17-18] 18 (02/21 0544) BP: (94-120)/(57-69) 105/66 mmHg (02/21 0544) SpO2:  [94 %-97 %] 97 % (02/21 0544)  Intake/Output from previous day: 02/20 0701 - 02/21 0700 In: 900 [P.O.:900] Out: 450 [Urine:450] Intake/Output this shift: Total I/O In: 240 [P.O.:240] Out: -    Recent Labs  04/17/14 1655 04/18/14 0600 04/19/14 0610  HGB 13.2 12.0* 11.0*    Recent Labs  04/18/14 0600 04/19/14 0610  WBC 11.7* 11.1*  RBC 3.89* 3.39*  HCT 37.1* 32.2*  PLT 186 145*    Recent Labs  04/17/14 1655 04/18/14 0600  NA  --  136  K  --  4.8  CL  --  101  CO2  --  30  BUN  --  18  CREATININE 1.16 1.22  GLUCOSE  --  128*  CALCIUM  --  8.9   No results for input(s): LABPT, INR in the last 72 hours.  Alert and oriented x3. RRR, Lungs clear, BS x4. Left Calf soft and non tender. L knee dressing changed. No DVT signs. No signs of infection or compartment syndrome. LLE neurovascularly intact.   Assessment/Plan: 2 Days Post-Op Procedure(s) (LRB): LEFT TOTAL KNEE ARTHROPLASTY (Left) Dressing changed Up with PT Plan D/c Home with HHPT tomorrow Possible need for SNF, but pt wishes to go home. Continue current care  Desirae Mancusi L 04/19/2014, 10:03 AM

## 2014-04-19 NOTE — Clinical Social Work Note (Signed)
CSW met with patient who was sitting in chair at bedside and daughter who was present at bedside. CSW introduced self and explained role. CSW explained SNF placement process and discussed d/c plan. Per patient, he is confident,"I will be able to manage at home even though you all might not think so." CSW asked patient to explain how manage looks to him. Per patient, he has a neighbor who could check in on him during the night and although his son lives 30 miles away, he would be able to check on him approximately 2-3 times a day. Patient states he "believes," he can make it around the house and is not concerned with potential risk of injury. Patient states daughter does not reside in [place] and has to go back to [place] in 3 days. CSW acknowledged patient's right to self-determination and plan for continued independence. Patient believes he will improve at home in the same rate as he would at a SNF. CSW aksed patient to explain how improvement at home without an established support system to provide consistency would look like. Patient states he could not "really" explain it because he is not certain of it. CSW explained benefits of SNF to include the 24/7 support. Patient continues to assert his belief he will be able to improve at home with HH PT and have "some sort of support." CSW acknowledged patient's statements and educated patient on safe d/c home to include adequate support in assisting with mobility and strength improvement. Patient's daughter interjected during assessment and explained to patient, his wife (who is currently admitted at [facility]) will have surgery this upcoming week and will possibly require, "a lot of management at home." Per daughter, she respects patient's wish to d/c home with HH PT, but will re-evaluate on his improvement on Tuesday or Wednesday and ability to manage at home, especially with his wife d/c home and requiring a lot of support to manage. Daughter states she will be available at  the home to provide patient with 24/7 support until Wednesday. Daughter expressed concern with having 2 parents home who require management. CSW educated daughter on placement from home for patient if his current condition requires more management than family and friends can provide. Patient and daughter agreeable to SNF placement from home if patient is not able to manage. No further needs identified at this time. CSW signing off.  Crystal Patrick-Jefferson, LCSWA Weekend Clinical Social Worker [phone]  

## 2014-04-19 NOTE — Progress Notes (Addendum)
Occupational Therapy Treatment Patient Details Name: Ricky Lee MRN: [id number] DOB: Mar 16, 1933 Today's Date: 04/19/2014    History of present illness Patient is a 81 yo male admitted 04/17/14 now s/p Lt TKA.  PMH:  CAD, MI, HTN, OSA, arthritis, Rt TKA in 1999, Rt reverse TSA 2013   OT comments  Pt seen today for functional mobility and ADL. OT stressed fall prevention and safety with ADLs and pt insisted he can manage at home independently. Pt required mod A to stand from recliner and was unable to push through UEs to clear RLE off floor due to pain and fatigue. Feel that this pt is a high fall risk if he returns home. Pt required max assist to pivot to the bed and max A to return to bed after nearly slipping off the EOB. Allowed pt to attempt to stand on his own and pt was unable to, began slipping off the EOB and OT assisted to return pt to supine. Despite this, pt continued to state that he could independently get to the bathroom at home. Strongly advising SNF at this time due to family situation and level of care that pt will require. Pt also wheezing with all mobility, but unable to get SPO2 reading with pulse ox due to cold hands. Replaced O2 [place] on pt at 2L.    Follow Up Recommendations  SNF;Supervision/Assistance - 24 hour    Equipment Recommendations  3 in 1 bedside comode    Recommendations for Other Services      Precautions / Restrictions Precautions Precautions: Knee;Fall Precaution Comments: Reviewed precautions with patient Required Braces or Orthoses: Knee Immobilizer - Left Knee Immobilizer - Left: On when out of bed or walking Restrictions Weight Bearing Restrictions: Yes LLE Weight Bearing: Weight bearing as tolerated       Mobility Bed Mobility Overal bed mobility: Needs Assistance Bed Mobility: Sit to Supine       Sit to supine: Max assist   General bed mobility comments: Pt sat on EOB, then was unable to stand from bedside to move up towards [place]. Pt  started slipping off EOB. Required assistance to scoot his hips back, max assist to helicopter to return to supine and prevent fall.   Transfers Overall transfer level: Needs assistance Equipment used: Rolling walker (2 wheeled) Transfers: Sit to/from [facility] Sit to Stand: Mod assist Stand pivot transfers: Max assist;+2 safety/equipment       General transfer comment: Multimodal cues for hand placement and sequencing. Mod A to stand from recliner. Pt unable to stand from EOB with max assistance due to fatigue and weakness.     Balance Overall balance assessment: Needs assistance Sitting-balance support: No upper extremity supported;Feet supported Sitting balance-Leahy Scale: Fair     Standing balance support: Bilateral upper extremity supported;During functional activity Standing balance-Leahy Scale: Poor Standing balance comment: Pt requires UE support on RW and is unable to remove for ADLs.                    ADL Overall ADL's : Needs assistance/impaired             Lower Body Bathing: +2 for safety/equipment;Moderate assistance;Sit to/from stand       Lower Body Dressing: Maximal assistance;+2 for safety/equipment;Sit to/from stand   Toilet Transfer: Moderate assistance;Stand-pivot;RW Toilet Transfer Details (indicate cue type and reason): recliner> bed. Pt with significant difficulty during transfer and unable to push through UEs to clear RLE from floor.  Toileting- Water quality scientist  and Hygiene: Moderate assistance;Sit to/from stand;+2 for safety/equipment   Tub/ Shower Transfer: Total assistance   Functional mobility during ADLs: Moderate assistance;Rolling walker;+2 for safety/equipment General ADL Comments: pt required significant assistance for ADLs and functional mobility. When pt attempted to stand without assist, he was unable yet continues to affirm that he will "manage at home." Strongly feel that pt is a high fall risk. Given  family situation, feel that SNF would be best option.                 Cognition  Arousal/Alertness: Awake/Alert Behavior During Therapy: WFL for tasks assessed/performed Overall Cognitive Status: No family/caregiver present to determine baseline cognitive functioning Area of Impairment: Safety/judgement;Awareness;Problem solving          Safety/Judgement: Decreased awareness of safety;Decreased awareness of deficits Awareness: Emergent Problem Solving: Slow processing;Difficulty sequencing;Requires verbal cues;Requires tactile cues General Comments: Despite multiple cues for hand placement and explanation of safety, pt attempting to place UEs on RW to push up to standing.                  Pertinent Vitals/ Pain       Pain Assessment: 0-10 Pain Score: 5  Pain Location: Lt knee Pain Descriptors / Indicators: Discomfort Pain Intervention(s): Limited activity within patient's tolerance;Monitored during session;Repositioned         Frequency Min 3X/week     Progress Toward Goals  OT Goals(current goals can now be found in the care plan section)  Progress towards OT goals: Not progressing toward goals - comment  Acute Rehab OT Goals Patient Stated Goal: To go home OT Goal Formulation: With patient Time For Goal Achievement: 04/25/14 Potential to Achieve Goals: Good  Plan Discharge plan remains appropriate       End of Session Equipment Utilized During Treatment: Gait belt;Rolling walker;Left knee immobilizer   Activity Tolerance Patient limited by fatigue;Patient limited by pain   Patient Left in bed;with call bell/phone within reach   Nurse Communication Mobility status        Time: 1779-3903 OT Time Calculation (min): 26 min  Charges: OT General Charges $OT Visit: 1 Procedure OT Treatments $Self Care/Home Management : 23-37 mins  Juluis Rainier 04/19/2014, 5:11 PM  Cyndie Chime, OTR/L Occupational Therapist [phone] (pager)

## 2014-04-19 NOTE — Progress Notes (Signed)
Physical Therapy Treatment Patient Details Name: Ricky Lee MRN: [id number] DOB: [birth year] Today's Date: 04/19/2014    History of Present Illness Patient is a 81 yo male admitted 04/17/14 now s/p Lt TKA.  PMH:  CAD, MI, HTN, OSA, arthritis, Rt TKA in 1999, Rt reverse TSA 2013    PT Comments    Pt agreeable to participate in PT session. Progressing slowly.   Reports pain as moderate.  Pt's daughter present entire session.  Currently pt cont's to requires mod assist for supine>sit, +2 to achieve standing, & +2 for safety with ambulation.  At this time this clinician feels pt would best benefit from SNF prior to d/c home to maximize functional mobility & independence however unsure if pt will be agreeable.  Will cont to follow acutely.     Follow Up Recommendations  Home health PT;Supervision/Assistance - 24 hour;SNF     Equipment Recommendations  None recommended by PT    Recommendations for Other Services       Precautions / Restrictions Precautions Precautions: Knee;Fall Precaution Comments: Reviewed precautions with patient Required Braces or Orthoses: Knee Immobilizer - Left Knee Immobilizer - Left: On when out of bed or walking Restrictions Weight Bearing Restrictions: Yes LLE Weight Bearing: Weight bearing as tolerated    Mobility  Bed Mobility Overal bed mobility: Needs Assistance Bed Mobility: Supine to Sit     Supine to sit: Mod assist     General bed mobility comments: (A) for LLE management & to bring shoulders/trunk to sitting upright.    Transfers Overall transfer level: Needs assistance Equipment used: Rolling walker (2 wheeled) Transfers: Sit to/from Stand Sit to Stand: Mod assist;+2 physical assistance         General transfer comment: cues for hand placement & technique.  Height of bed elevated.  (A) to power up to standing & shift weight anteriorly.    Ambulation/Gait Ambulation/Gait assistance: Min assist;+2 safety/equipment Ambulation  Distance (Feet): 12 Feet Assistive device: Rolling walker (2 wheeled) Gait Pattern/deviations: Step-to pattern;Decreased step length - right;Decreased step length - left;Decreased weight shift to left Gait velocity: decreased   General Gait Details: (A) for stability.  Pt continues to have difficulty WBing LLE & taking sufficient step with RLE.     Stairs            Wheelchair Mobility    Modified Rankin (Stroke Patients Only)       Balance                                    Cognition Arousal/Alertness: Awake/alert Behavior During Therapy: WFL for tasks assessed/performed Overall Cognitive Status: Within Functional Limits for tasks assessed                      Exercises Total Joint Exercises Ankle Circles/Pumps: AROM;Both;10 reps Quad Sets: AROM;Both;10 reps Hip ABduction/ADduction: AAROM;Left;10 reps Straight Leg Raises: AAROM;Strengthening;Left;10 reps    General Comments        Pertinent Vitals/Pain Pain Assessment: 0-10 Pain Score: 7  Pain Location: Lt knee Pain Descriptors / Indicators: Aching Pain Intervention(s): Limited activity within patient's tolerance;Monitored during session;Repositioned (RN administered pain medication at end of session)    Home Living                      Prior Function            PT Goals (  current goals can now be found in the care plan section) Acute Rehab PT Goals Patient Stated Goal: To go home PT Goal Formulation: With patient Time For Goal Achievement: 04/24/14 Potential to Achieve Goals: Good Progress towards PT goals: Progressing toward goals (slowly)    Frequency  7X/week    PT Plan Current plan remains appropriate    Co-evaluation             End of Session Equipment Utilized During Treatment: Gait belt;Left knee immobilizer Activity Tolerance: Patient limited by pain;Patient tolerated treatment well Patient left: in chair;with call bell/phone within reach;with  family/visitor present     Time: 4650-3546 PT Time Calculation (min) (ACUTE ONLY): 31 min  Charges:  $Gait Training: 8-22 mins $Therapeutic Exercise: 8-22 mins                    G Codes:      [name] 04/19/2014, 10:09 AM   [name], PTA [phone] 04/19/2014

## 2014-04-19 NOTE — Progress Notes (Signed)
Orthopedics Progress Note  Subjective: Patient reports having a good day other than some pain after being in the C[facility] for a little to long  Objective:  Filed Vitals:   04/19/14 1554  BP: 118/64  Pulse: 101  Temp: 98.6 F (37 C)  Resp: 18    General: Awake and alert  Musculoskeletal: Left LE in immobilizer, ankle pumps without pain Neurovascularly intact  Lab Results  Component Value Date   WBC 11.1* 04/19/2014   HGB 11.0* 04/19/2014   HCT 32.2* 04/19/2014   MCV 95.0 04/19/2014   PLT 145* 04/19/2014       Component Value Date/Time   NA 136 04/18/2014 0600   K 4.8 04/18/2014 0600   CL 101 04/18/2014 0600   CO2 30 04/18/2014 0600   GLUCOSE 128* 04/18/2014 0600   BUN 18 04/18/2014 0600   CREATININE 1.22 04/18/2014 0600   CALCIUM 8.9 04/18/2014 0600   GFRNONAA 55* 04/18/2014 0600   GFRAA 63* 04/18/2014 0600    Lab Results  Component Value Date   INR 1.03 04/07/2014   INR 0.94 11/10/2009    Assessment/Plan: POD #2 s/p Procedure(s): LEFT TOTAL KNEE ARTHROPLASTY Patient stable ortho wise.  I had a frank discussion with Ricky Lee and discussed discharge plans with him.  Based upon OT and PT recommendations and the situation that his wife is in, I strongly recommend SNF for safety as the patient is at an extremely high fall risk.  The patient will be unable to care for his wife. Will work on d/c planning with the general surgery team to hopefully get he and his wife to the same SNF.  Ricky Lee. [name], MD 04/19/2014 6:56 PM

## 2014-04-20 ENCOUNTER — Encounter (HOSPITAL_COMMUNITY): Payer: Self-pay | Admitting: General Practice

## 2014-04-20 LAB — URINALYSIS, ROUTINE W REFLEX MICROSCOPIC
BILIRUBIN URINE: NEGATIVE
GLUCOSE, UA: NEGATIVE mg/dL
Hgb urine dipstick: NEGATIVE
Ketones, ur: NEGATIVE mg/dL
Leukocytes, UA: NEGATIVE
Nitrite: NEGATIVE
Protein, ur: NEGATIVE mg/dL
SPECIFIC GRAVITY, URINE: 1.018 (ref 1.005–1.030)
Urobilinogen, UA: 0.2 mg/dL (ref 0.0–1.0)
pH: 5 (ref 5.0–8.0)

## 2014-04-20 LAB — CBC
HEMATOCRIT: 30.1 % — AB (ref 39.0–52.0)
HEMOGLOBIN: 10.1 g/dL — AB (ref 13.0–17.0)
MCH: 31.2 pg (ref 26.0–34.0)
MCHC: 33.6 g/dL (ref 30.0–36.0)
MCV: 92.9 fL (ref 78.0–100.0)
PLATELETS: 150 10*3/uL (ref 150–400)
RBC: 3.24 MIL/uL — ABNORMAL LOW (ref 4.22–5.81)
RDW: 13.9 % (ref 11.5–15.5)
WBC: 10.2 10*3/uL (ref 4.0–10.5)

## 2014-04-20 NOTE — Progress Notes (Signed)
   Subjective: 3 Days Post-Op Procedure(s) (LRB): LEFT TOTAL KNEE ARTHROPLASTY (Left)  Pt c/o mild to moderate pain Pain with upper arm strength using a walker  Recommend SNF placement Patient reports pain as moderate.  Objective:   VITALS:   Filed Vitals:   04/20/14 0535  BP: 87/49  Pulse: 98  Temp: 99 F (37.2 C)  Resp: 18    Left knee incision healing well nv intact distally No rashes  Minimal edema distally  LABS  Recent Labs  04/18/14 0600 04/19/14 0610 04/20/14 0440  HGB 12.0* 11.0* 10.1*  HCT 37.1* 32.2* 30.1*  WBC 11.7* 11.1* 10.2  PLT 186 145* 150     Recent Labs  04/17/14 1655 04/18/14 0600  NA  --  136  K  --  4.8  BUN  --  18  CREATININE 1.16 1.22  GLUCOSE  --  128*     Assessment/Plan: 3 Days Post-Op Procedure(s) (LRB): LEFT TOTAL KNEE ARTHROPLASTY (Left) Recommend SNF placement Will contact social work about seeing pt again May discharge once bed available, probably will be tomorrow Continue PT/OT   [name], MPAS, PA-C  04/20/2014, 9:44 AM

## 2014-04-20 NOTE — Care Management Note (Signed)
    Page 1 of 2   04/22/2014     2:32:21 PM CARE MANAGEMENT NOTE 04/22/2014  Patient:  Ricky Lee, Ricky Lee   Account Number:  [id number]  Date Initiated:  04/18/2014  Documentation initiated by:  Oliveras-Aizpurua,Jeannette  Subjective/Objective Assessment:   81 yo male underwent L TKA.     Action/Plan:   D/C pan   Anticipated DC Date:  04/20/2014   Anticipated DC Plan:  [place] referral  Clinical Social Worker      DC Planning Services  CM consult      Choice offered to / List presented to:             Status of service:  Completed, signed off Medicare Important Message given?  YES (If response is "NO", the following Medicare IM given date fields will be blank) Date Medicare IM given:  04/20/2014 Medicare IM given by:  Lorne Skeens Date Additional Medicare IM given:   Additional Medicare IM given by:    Discharge Disposition:  [place]  Per UR Regulation:  Reviewed for med. necessity/level of care/duration of stay  If discussed at [place] of Stay Meetings, dates discussed:    Comments:  04/20/14 [place], MSN, CM- Met with patient to verify discharge disposition.  Patient states that he plans to discharge directly home.  He is not interested in short-term rehab/snf.  Patient stated that his son will be coming to stay with him and provide 24/7/care.  Patient did, however, give permission to be faxed out to [facility] as a back-up plan.  CSW was notified.  CM will continue to follow.  Medicare IM letter provided.    04/18/2014 1330 - Frann Rider, RN, BSN  Daughter requesting to speak with SW. Met with pt and daughter. Pt's wife is inpt with an obstruction and she may need surgery. Daughter is concern about the d/c plan since her mom is in the hospital. She lives in [place]. Explained PT recommendations. He needs 24 hr supervision. He was only able to tolerate bed to chair transfer due to pain. Pt  stated that he is not going to SNF. Daughter reports that she is planning to help her father, but she is concern since her mom is also in the hospital. Explained SW referral. Contacted SW and left a VM regarding daughter's request to speak to a SW. Daughter's cell phone [phone]. Will continue to f/u.

## 2014-04-20 NOTE — Progress Notes (Addendum)
Occupational Therapy Treatment Patient Details Name: Ricky Lee MRN: [id number] DOB: Jul 09, 1933 Today's Date: 04/20/2014    History of present illness Patient is a 81 yo male admitted 04/17/14 now s/p Lt TKA.  PMH:  CAD, MI, HTN, OSA, arthritis, Rt TKA in 1999, Rt reverse TSA 2013   OT comments  Pt. Continues with slow progress.  Max a for all components of bed mobility.  Mod a sit/stand and still x2 for pivot.  Son present this am for session and states he is available for pt. 24/7 as he has up to 5 weeks vacation time built up and can take FMLA if needed.  Had son take active role in hands on session to see what level of assist pt. Is needing.  Son able to assist pt. From sit/stand stating it was "not that bad".  Even with visible limitations and pts. Significant need for assist pt. And son remain united that they can manage.  Discussed at length our concerns with fall risk and if pt.s son understood what 24/7 care would mean he said "yeah i can stay for a few days at least if he needs it" reiterated that it was under the assumption it would be steady 24/7, he would verbalize understanding but still not convinced he is accepting of his role in his fathers care if they continue to insist on home.  Dynamics remain complicated as the mother is still a pt. On 6th floor and son alluded to issues with him and his sister, as he states he has not been in touch with her about any d/c plans.   i reviewed that she had been stating her return to [place] on wed. And we had no mention of his availability that he provided this am.  Based on info. Stated above with pt.s continued physical imitations paired with family dynamics and conflicting reports of available assist still strongly rec. SNF  For continued therapy and safety.  Follow Up Recommendations  SNF;Supervision/Assistance - 24 hour    Equipment Recommendations  3 in 1 bedside comode    Recommendations for Other Services      Precautions /  Restrictions Precautions Precautions: Knee;Fall Precaution Comments: Reviewed precautions with patient Required Braces or Orthoses: Knee Immobilizer - Left Knee Immobilizer - Left: On when out of bed or walking Restrictions LLE Weight Bearing: Weight bearing as tolerated       Mobility Bed Mobility Overal bed mobility: Needs Assistance Bed Mobility: Supine to Sit     Supine to sit: Max assist     General bed mobility comments: hob flat rails to simulate and demonstrate to pt. and pts. son what home will be like.  pt. max a to move BLEs out of bed, and to bring trunk upright  Transfers Overall transfer level: Needs assistance Equipment used: Rolling walker (2 wheeled) Transfers: Sit to/from [facility] Sit to Stand: Mod assist Stand pivot transfers: Max assist;+2 safety/equipment       General transfer comment: pt. able to transtion from sit/stand with son and they counted together.  son states he did not feel it 'was too much effort" and described it as "moderate assist".  x2 for pivot for equipment and safety as pt. was unable to move LLE or RLE and relied heavily on sliding and shimying the feet.  recliner moved multiple times to bring directly behind pt. as he was  unable to negotiate b les safely for standard recliner placement beside bed.  Balance     Sitting balance-Leahy Scale: Fair       Standing balance-Leahy Scale: Poor                     ADL Overall ADL's : Needs assistance/impaired                         Toilet Transfer: Maximal assistance;RW;Stand-pivot Toilet Transfer Details (indicate cue type and reason): recliner> bed. Pt with significant difficulty during transfer and unable to push through UEs to clear RLE from floor. , simulated going eob to [place] and Hygiene: Moderate assistance;Sit to/from stand;+2 for safety/equipment       Functional mobility during ADLs: Moderate  assistance;Rolling walker;+2 for safety/equipment General ADL Comments: pt. remains at needing and requiring mod/max a for all fundtional mobility.  son present for session and was active in tx. session.  assisted with sit/stand and described it as "moderate assist"  unable to move LLE during pivot      Vision                     Perception     Praxis      Cognition   Behavior During Therapy: [facility] for tasks assessed/performed                         Extremity/Trunk Assessment               Exercises     Shoulder Instructions       General Comments      Pertinent Vitals/ Pain       Pain Assessment: No/denies pain  Home Living                                          Prior Functioning/Environment              Frequency Min 3X/week     Progress Toward Goals  OT Goals(current goals can now be found in the care plan section)  Progress towards OT goals: Progressing toward goals     Plan Discharge plan remains appropriate    Co-evaluation                 End of Session Equipment Utilized During Treatment: Gait belt;Rolling walker;Left knee immobilizer   Activity Tolerance Patient limited by fatigue   Patient Left in chair;with call bell/phone within reach;with family/visitor present   Nurse Communication          Time: 6811-5726 OT Time Calculation (min): 26 min  Charges: OT General Charges $OT Visit: 1 Procedure OT Treatments $Self Care/Home Management : 23-37 mins  Janice Coffin, COTA/L 04/20/2014, 9:05 AM

## 2014-04-20 NOTE — Progress Notes (Signed)
Physical Therapy Treatment Patient Details Name: Ricky Lee MRN: [id number] DOB: December 02, 1933 Today's Date: 04/20/2014    History of Present Illness Patient is a 81 yo male admitted 04/17/14 now s/p Lt TKA.  PMH:  CAD, MI, HTN, OSA, arthritis, Rt TKA in 1999, Rt reverse TSA 2013    PT Comments    Pt slowly progressing towards goals. Pt con't to requires assist for all transfers and relies on bilat UEs during amb greatly limiting ambulation tolerance as pt minimal clears R LE due to decreased L LE WBing. Attempted stair negotiation this date pt unable to achieve 1 step to enter let alone th 7 he has to do to enter home. Pt remains unsafe to return home. Re-visted d/c plan of going to [facility] due to above functional deficits. Pt agreed to talk to [place]. Pt concerned for wife who is having surgery today. Pt educated that if he goes home someone would have to be with him all the time and not be able to with his wife. Pt understood and agreed to look into ST-SNF. CM notified.   Follow Up Recommendations  SNF;Supervision/Assistance - 24 hour     Equipment Recommendations  None recommended by PT    Recommendations for Other Services       Precautions / Restrictions Precautions Precautions: Knee;Fall Precaution Booklet Issued: Yes (comment) Precaution Comments: Reviewed precautions with patient Required Braces or Orthoses: Knee Immobilizer - Left Knee Immobilizer - Left: On when out of bed or walking Restrictions Weight Bearing Restrictions: Yes LLE Weight Bearing: Weight bearing as tolerated    Mobility  Bed Mobility Overal bed mobility: Needs Assistance Bed Mobility: Supine to Sit     Supine to sit: Max assist     General bed mobility comments: pt up in chair  Transfers Overall transfer level: Needs assistance Equipment used: Rolling walker (2 wheeled) Transfers: Sit to/from Stand Sit to Stand: Mod assist Stand pivot transfers: Max assist;+2 safety/equipment        General transfer comment: increased time, assist to steady while transitioning hands. labored effort  Ambulation/Gait Ambulation/Gait assistance: Min assist;+2 safety/equipment Ambulation Distance (Feet): 20 Feet (x2) Assistive device: Rolling walker (2 wheeled) Gait Pattern/deviations: Step-to pattern;Shuffle;Trunk flexed Gait velocity: decreased Gait velocity interpretation: Below normal speed for age/gender General Gait Details: pt with significant UE WBing to where pt c/o bilat forearm discomfort limiting ambulation tolerance. Pt with minimal R foot clearance due to decreased L LE WBing.. Pt fatigues quickly with + DOE.    Stairs Stairs: Yes       General stair comments: attempted stair negotiation however pt unable to clear R foot enough to ascend step despite maxAx2.  Wheelchair Mobility    Modified Rankin (Stroke Patients Only)       Balance     Sitting balance-Leahy Scale: Fair     Standing balance support: Bilateral upper extremity supported Standing balance-Leahy Scale: Poor Standing balance comment: pt requires use of RW                    Cognition Arousal/Alertness: Awake/alert Behavior During Therapy: WFL for tasks assessed/performed   Area of Impairment: Safety/judgement;Awareness         Safety/Judgement: Decreased awareness of safety Awareness: Emergent   General Comments: pt beginning to see why going home is unsafe however still decreased insight to true deficits and potential problems he will have at home in his current state.    Exercises Total Joint Exercises Ankle Circles/Pumps: AROM;Both;10 reps [employer]  Sets: AROM;Left;10 reps (with 5 sec hold) Goniometric ROM: 6-50 L knee flexion    General Comments        Pertinent Vitals/Pain Pain Assessment: 0-10 Pain Score: 6  Pain Location: Lt knee Pain Descriptors / Indicators: Aching Pain Intervention(s): Monitored during session    Home Living Family/patient expects to be  discharged to:: Private residence Living Arrangements: Spouse/significant other                  Prior Function            PT Goals (current goals can now be found in the care plan section) Acute Rehab PT Goals Patient Stated Goal: to go home Progress towards PT goals: Progressing toward goals    Frequency  7X/week    PT Plan Current plan remains appropriate    Co-evaluation             End of Session Equipment Utilized During Treatment: Gait belt;Left knee immobilizer Activity Tolerance: Patient tolerated treatment well Patient left: in chair;with call bell/phone within reach;with family/visitor present     Time: 8676-7209 PT Time Calculation (min) (ACUTE ONLY): 34 min  Charges:  $Gait Training: 8-22 mins $Therapeutic Exercise: 8-22 mins                    G Codes:      Kingsley Callander 04/20/2014, 12:53 PM   Kittie Plater, PT, DPT Pager #: [phone] Office #: [phone]

## 2014-04-20 NOTE — Clinical Social Work Note (Signed)
CSW received notification that patient's daughter is requesting to speak with CSW.  Daughter not at bedside.  CSW placed call to daughter and left message with return contact information.  CSW awaiting a return call from daughter to assist with answering disposition questions.  Nonnie Done, [place] [phone]  Psychiatric & Orthopedics (5N 1-16) Clinical Social Worker

## 2014-04-20 NOTE — Progress Notes (Signed)
Urine output only 400 for day shift, tea colored and malodorous. Pt can't urinate for [place]. Bladder scanned at 0105 470ml. Orders from [facility] for in an out cath and urinalysis.

## 2014-04-20 NOTE — Progress Notes (Signed)
UR complete.  Yaqueline Gutter RN, MSN 

## 2014-04-21 NOTE — Discharge Summary (Signed)
Physician Discharge Summary   Patient ID: Ricky Lee MRN: [id number] DOB/AGE: [birth year] 79 y.o.  Admit date: 04/17/2014 Discharge date: 04/21/2014  Admission Diagnoses:  Active Problems:   S/P knee replacement   Discharge Diagnoses:  Same   Surgeries: Procedure(s): LEFT TOTAL KNEE ARTHROPLASTY on 04/17/2014   Consultants: PT/OT  Discharged Condition: Stable  Hospital Course: Ricky Lee is an 81 y.o. male who was admitted 04/17/2014 with a chief complaint of No chief complaint on file. , and found to have a diagnosis of <principal problem not specified>.  They were brought to the operating room on 04/17/2014 and underwent the above named procedures.    The patient had an uncomplicated hospital course and was stable for discharge.  Recent vital signs:  Filed Vitals:   04/21/14 0600  BP: 115/58  Pulse: 107  Temp: 98.1 F (36.7 C)  Resp: 18    Recent laboratory studies:  Results for orders placed or performed during the hospital encounter of 04/17/14  CBC  Result Value Ref Range   WBC 10.9 (H) 4.0 - 10.5 K/uL   RBC 4.24 4.22 - 5.81 MIL/uL   Hemoglobin 13.2 13.0 - 17.0 g/dL   HCT 40.2 39.0 - 52.0 %   MCV 94.8 78.0 - 100.0 fL   MCH 31.1 26.0 - 34.0 pg   MCHC 32.8 30.0 - 36.0 g/dL   RDW 13.6 11.5 - 15.5 %   Platelets 187 150 - 400 K/uL  Creatinine, serum  Result Value Ref Range   Creatinine, Ser 1.16 0.50 - 1.35 mg/dL   GFR calc non Af Amer 58 (L) >90 mL/min   GFR calc Af Amer 67 (L) >90 mL/min  CBC  Result Value Ref Range   WBC 11.7 (H) 4.0 - 10.5 K/uL   RBC 3.89 (L) 4.22 - 5.81 MIL/uL   Hemoglobin 12.0 (L) 13.0 - 17.0 g/dL   HCT 37.1 (L) 39.0 - 52.0 %   MCV 95.4 78.0 - 100.0 fL   MCH 30.8 26.0 - 34.0 pg   MCHC 32.3 30.0 - 36.0 g/dL   RDW 13.5 11.5 - 15.5 %   Platelets 186 150 - 400 K/uL  Basic metabolic panel  Result Value Ref Range   Sodium 136 135 - 145 mmol/L   Potassium 4.8 3.5 - 5.1 mmol/L   Chloride 101 96 - 112 mmol/L   CO2 30 19 - 32  mmol/L   Glucose, Bld 128 (H) 70 - 99 mg/dL   BUN 18 6 - 23 mg/dL   Creatinine, Ser 1.22 0.50 - 1.35 mg/dL   Calcium 8.9 8.4 - 10.5 mg/dL   GFR calc non Af Amer 55 (L) >90 mL/min   GFR calc Af Amer 63 (L) >90 mL/min   Anion gap 5 5 - 15  CBC  Result Value Ref Range   WBC 11.1 (H) 4.0 - 10.5 K/uL   RBC 3.39 (L) 4.22 - 5.81 MIL/uL   Hemoglobin 11.0 (L) 13.0 - 17.0 g/dL   HCT 32.2 (L) 39.0 - 52.0 %   MCV 95.0 78.0 - 100.0 fL   MCH 32.4 26.0 - 34.0 pg   MCHC 34.2 30.0 - 36.0 g/dL   RDW 13.8 11.5 - 15.5 %   Platelets 145 (L) 150 - 400 K/uL  CBC  Result Value Ref Range   WBC 10.2 4.0 - 10.5 K/uL   RBC 3.24 (L) 4.22 - 5.81 MIL/uL   Hemoglobin 10.1 (L) 13.0 - 17.0 g/dL   HCT 30.1 (L)  39.0 - 52.0 %   MCV 92.9 78.0 - 100.0 fL   MCH 31.2 26.0 - 34.0 pg   MCHC 33.6 30.0 - 36.0 g/dL   RDW 13.9 11.5 - 15.5 %   Platelets 150 150 - 400 K/uL  Urinalysis, Routine w reflex microscopic  Result Value Ref Range   Color, Urine AMBER (A) YELLOW   APPearance CLOUDY (A) CLEAR   Specific Gravity, Urine 1.018 1.005 - 1.030   pH 5.0 5.0 - 8.0   Glucose, UA NEGATIVE NEGATIVE mg/dL   Hgb urine dipstick NEGATIVE NEGATIVE   Bilirubin Urine NEGATIVE NEGATIVE   Ketones, ur NEGATIVE NEGATIVE mg/dL   Protein, ur NEGATIVE NEGATIVE mg/dL   Urobilinogen, UA 0.2 0.0 - 1.0 mg/dL   Nitrite NEGATIVE NEGATIVE   Leukocytes, UA NEGATIVE NEGATIVE    Discharge Medications:     Medication List    STOP taking these medications        sodium chloride 0.9 % SOLN 250 mL with vancomycin 10 G SOLR 1,250 mg     vancomycin 1 GM/200ML Soln  Commonly known as:  VANCOCIN      TAKE these medications        aspirin 325 MG tablet  Take 325 mg by mouth daily.     enoxaparin 40 MG/0.4ML injection  Commonly known as:  LOVENOX  Inject 0.4 mLs (40 mg total) into the skin daily. 30 days post op     enoxaparin 40 MG/0.4ML injection  Commonly known as:  LOVENOX  Inject 0.4 mLs (40 mg total) into the skin daily. 30 days  post op     lisinopril 5 MG tablet  Commonly known as:  PRINIVIL,ZESTRIL  Take 5 mg by mouth daily.     methocarbamol 500 MG tablet  Commonly known as:  ROBAXIN  Take 1 tablet (500 mg total) by mouth 3 (three) times daily as needed.     methocarbamol 500 MG tablet  Commonly known as:  ROBAXIN  Take 1 tablet (500 mg total) by mouth 3 (three) times daily as needed.     metoprolol tartrate 25 MG tablet  Commonly known as:  LOPRESSOR  Take 25 mg by mouth 2 (two) times daily.     oxyCODONE-acetaminophen 5-325 MG per tablet  Commonly known as:  ROXICET  Take 1-2 tablets by mouth every 4 (four) hours as needed for severe pain.     oxyCODONE-acetaminophen 5-325 MG per tablet  Commonly known as:  ROXICET  Take 1-2 tablets by mouth every 4 (four) hours as needed for severe pain.     simvastatin 40 MG tablet  Commonly known as:  ZOCOR  Take 40 mg by mouth at bedtime.        Diagnostic Studies: Dg Chest 2 View  04/07/2014   CLINICAL DATA:  Hypertension.  EXAM: CHEST  2 VIEW  COMPARISON:  01/11/2012.  FINDINGS: Mediastinum and hilar structures are normal mild bibasilar subsegmental atelectasis and/or scarring. No change. No acute infiltrate. No pleural effusion or pneumothorax. Heart size normal. Right shoulder replacement .  IMPRESSION: Bibasilar subsegmental atelectasis and/or scarring. No change. No acute cardiopulmonary disease.   Electronically Signed   By: Marcello Moores  Register   On: 04/07/2014 10:28   Dg Knee Left Port  04/17/2014   CLINICAL DATA:  Postop left knee replacement.  EXAM: PORTABLE LEFT KNEE - 1-2 VIEW  COMPARISON:  None.  FINDINGS: New total knee arthroplasty which is well seated. No periprosthetic fracture or dislocation. There is expected soft  tissue gas.  IMPRESSION: New total knee arthroplasty.  No unexpected findings.   Electronically Signed   By: Monte Fantasia M.D.   On: 04/17/2014 15:47    Disposition: 01-Home or Self Care        Follow-up Information     Follow up with NORRIS,STEVEN R, MD. Call in 2 weeks.   Specialty:  Orthopedic Surgery   Why:  [id number]   Contact information:   [address] [place] [address] [phone]        Signed: Ventura Bruns 04/21/2014, 10:12 AM

## 2014-04-21 NOTE — Progress Notes (Signed)
Physical Therapy Treatment Patient Details Name: Ricky Lee MRN: [id number] DOB: March 12, 1933 Today's Date: 04/21/2014    History of Present Illness Patient is a 81 yo male admitted 04/17/14 now s/p Lt TKA.  PMH:  CAD, MI, HTN, OSA, arthritis, Rt TKA in 1999, Rt reverse TSA 2013    PT Comments    Pt continues to progress nicely during his PM session today. Min assist, overall for all mobility with RW.  He continues to be most limited during gait by upper extremity fatigue and DOE (with stable RA O2 sats and HR).  Pt would continue to benefit from SNF for rehab at discharge.  PT will continue to follow acutely.   Follow Up Recommendations  SNF     Equipment Recommendations  None recommended by PT    Recommendations for Other Services   NA     Precautions / Restrictions Precautions Precautions: Knee;Fall Precaution Booklet Issued: Yes (comment) Precaution Comments: reviewed exercise handout with pt  Required Braces or Orthoses:  (d/c by PT) Restrictions LLE Weight Bearing: Weight bearing as tolerated    Mobility  Bed Mobility Overal bed mobility: Needs Assistance Bed Mobility: Supine to Sit;Sit to Supine     Supine to sit: Min guard Sit to supine: Min guard   General bed mobility comments: Min guard assist for part of the transition of leg out of bed.    Transfers Overall transfer level: Needs assistance Equipment used: Rolling walker (2 wheeled) Transfers: Sit to/from Stand Sit to Stand: Min assist         General transfer comment: Min assist using momentum and heavy upper extremity support to push to standing from lower recliner chair.    Ambulation/Gait Ambulation/Gait assistance: Min guard Ambulation Distance (Feet): 120 Feet Assistive device: Rolling walker (2 wheeled) Gait Pattern/deviations: Step-through pattern;Antalgic;Trunk flexed Gait velocity: decreased Gait velocity interpretation: Below normal speed for age/gender General Gait Details: Pt  with moderatly antalgic gait pattern.  Verbal cues for upright posture and lighter grip on RW with hands.  Pt fatiges easily and has DOE with gait, however, O2 sats are stable and pt reports he has DOE at baseline with mobility. Min guard assist for safety as pt still gets RW hung on the wheels around tight obstacles.           Balance Overall balance assessment: Needs assistance Sitting-balance support: Feet supported;No upper extremity supported Sitting balance-Leahy Scale: Good     Standing balance support: Bilateral upper extremity supported Standing balance-Leahy Scale: Poor                      Cognition Arousal/Alertness: Awake/alert Behavior During Therapy: WFL for tasks assessed/performed Overall Cognitive Status: Within Functional Limits for tasks assessed                      Exercises Total Joint Exercises Ankle Circles/Pumps: AROM;Both;20 reps;Supine Quad Sets: AROM;Left;10 reps;Supine Towel Squeeze: AROM;Both;10 reps;Supine Short Arc Quad: AROM;Left;10 reps;Supine Heel Slides: AROM;AAROM;Left;10 reps;Supine Hip ABduction/ADduction: AROM;Left;10 reps;Supine Straight Leg Raises: AROM;Left;10 reps;Supine        Pertinent Vitals/Pain Pain Assessment: 0-10 Pain Score: 0-No pain (at rest) Pain Location: left knee           PT Goals (current goals can now be found in the care plan section) Acute Rehab PT Goals Patient Stated Goal: to go home Progress towards PT goals: Progressing toward goals    Frequency  7X/week    PT Plan Current plan  remains appropriate       End of Session   Activity Tolerance: Patient limited by fatigue Patient left: in bed;in CPM;with call bell/phone within reach     Time: 1749-4496 PT Time Calculation (min) (ACUTE ONLY): 43 min  Charges:  $Gait Training: 8-22 mins $Therapeutic Exercise: 23-37 mins                      Vivianne Carles B. McGehee, [place], DPT [phone]   04/21/2014, 5:58 PM

## 2014-04-21 NOTE — Clinical Social Work Psychosocial (Signed)
Clinical Social Work Department BRIEF PSYCHOSOCIAL ASSESSMENT 04/20/2014  Patient:  Ricky Lee, Ricky Lee     Account Number:  [id number]     Admit date:  04/17/2014  Clinical Social Worker:  Wylene Men  Date/Time:  04/20/2014 11:47 AM  Referred by:  Physician  Date Referred:  04/20/2014 Referred for  SNF Placement   Other Referral:   none   Interview type:  Other - See comment Other interview type:   patient and patient's daughter, Ricky Lee    PSYCHOSOCIAL DATA Living Status:  WIFE Admitted from facility:   Level of care:   Primary support name:  Ricky Lee Primary support relationship to patient:  CHILD, ADULT Degree of support available:   adequate to strong    CURRENT CONCERNS Current Concerns  Post-Acute Placement   Other Concerns:   Patient's wife is currently inpatient at [facility] on 6N and will likely need SNF placement as well.  CSW to coordinate with 6N CSW for same SNF placement.    SOCIAL WORK ASSESSMENT / PLAN CSW assessed patient at bedside.  Patient was alert and oriented x4 during the course of this assessment. Daughter, Ricky Lee, was at bedside and participated in this assessment per patient's request.  Patient originally expressed to [place] that patient would be interested in [facility]. During the course of this assessment, the patient changed this request to [facility]. [facility] bed offers were given to both patient and daughter.  Daughter wishes to tour facilities and will do so today.  Patient and daugher are aware of discharge time/day drawing near and is agreeable to disclose bed choice to CSW by the morning of 04/22/2014.  Patient and patient's daughter expressed added stressors at this time due to patient's wife being inpatient at [facility] at this time. Patient's wife is currently on 6N.  CSW offered support and assured patient and patient's daughter that coordination of care would be our goal in getting both patient and patient's wife to the  same SNF.  This will be our goal. The patient and patient's daughter understand that there is no guarantee and this is simply our goal.  CSW will coordinate with 6N CSW for shared disposition.   Assessment/plan status:  Psychosocial Support/Ongoing Assessment of Needs Other assessment/ plan:   FL2  PASARR   Information/referral to community resources:   SNF/STR    PATIENT'S/FAMILY'S RESPONSE TO PLAN OF CARE: Patient and patient's daugher is agreeable to SNF search of [place] and [employer] [facility].

## 2014-04-21 NOTE — Progress Notes (Signed)
   Subjective: 4 Days Post-Op Procedure(s) (LRB): LEFT TOTAL KNEE ARTHROPLASTY (Left)  Pt feeling better today Plan for SNF placement today Denies any new symptoms or issues Patient reports pain as mild.  Objective:   VITALS:   Filed Vitals:   04/21/14 0600  BP: 115/58  Pulse: 107  Temp: 98.1 F (36.7 C)  Resp: 18    Left knee incision healing well nv intact distally Mild edema distally No drainage   LABS  Recent Labs  04/19/14 0610 04/20/14 0440  HGB 11.0* 10.1*  HCT 32.2* 30.1*  WBC 11.1* 10.2  PLT 145* 150    No results for input(s): NA, K, BUN, CREATININE, GLUCOSE in the last 72 hours.   Assessment/Plan: 4 Days Post-Op Procedure(s) (LRB): LEFT TOTAL KNEE ARTHROPLASTY (Left) Plan for d/c to snf today F/u in 2 weeks Continue PT/OT    [name], MPAS, PA-C  04/21/2014, 10:11 AM

## 2014-04-21 NOTE — Clinical Social Work Placement (Addendum)
Clinical Social Work Department CLINICAL SOCIAL WORK PLACEMENT NOTE 04/21/2014  Patient:  Ricky Lee, Ricky Lee  Account Number:  [id number] Admit date:  04/17/2014  Clinical Social Worker:  Wylene Men  Date/time:  04/21/2014 11:55 AM  Clinical Social Work is seeking post-discharge placement for this patient at the following level of care:   SKILLED NURSING   (*CSW will update this form in Epic as items are completed)   04/20/2014  Patient/family provided with [facility] Department of Clinical Social Work's list of facilities offering this level of care within the geographic area requested by the patient (or if unable, by the patient's family).  04/20/2014  Patient/family informed of their freedom to choose among providers that offer the needed level of care, that participate in Medicare, Medicaid or managed care program needed by the patient, have an available bed and are willing to accept the patient.  04/20/2014  Patient/family informed of [facility]' ownership interest in [facility], as well as of the fact that they are under no obligation to receive care at this facility.  PASARR submitted to EDS on 04/20/2014 PASARR number received on 04/20/2014  FL2 transmitted to all facilities in geographic area requested by pt/family on  04/20/2014 FL2 transmitted to all facilities within larger geographic area on   Patient informed that his/her managed care company has contracts with or will negotiate with  certain facilities, including the following:     Patient/family informed of bed offers received:  04/21/2014 Patient chooses bed at 04/21/2014 Physician recommends and patient chooses bed at    Patient to be transferred to  [facility] on  04/22/2014 Patient to be transferred to facility by [facility] Patient and family notified of transfer on 04/22/2014 Name of family member notified:  Lemmie Evens- daughter  The following physician request were entered in  Epic:   Additional Comments:

## 2014-04-21 NOTE — Progress Notes (Signed)
Physical Therapy Treatment Patient Details Name: Ricky Lee MRN: [id number] DOB: 05-Jun-1933 Today's Date: 04/21/2014    History of Present Illness Patient is a 81 yo male admitted 04/17/14 now s/p Lt TKA.  PMH:  CAD, MI, HTN, OSA, arthritis, Rt TKA in 1999, Rt reverse TSA 2013    PT Comments    Pt is progressing well with his mobility today.  He did better with more normal gait pattern without his KI on today.  Pt continues to be appropriate for SNF level rehab at discharge, but is making much better progress than earlier sessions.    Follow Up Recommendations  SNF     Equipment Recommendations  None recommended by PT    Recommendations for Other Services   NA     Precautions / Restrictions Precautions Precautions: Knee;Fall Precaution Booklet Issued: Yes (comment) Precaution Comments: Reviewed precautions with patient Required Braces or Orthoses:  (d/c by PT today) Restrictions LLE Weight Bearing: Weight bearing as tolerated    Mobility  Bed Mobility Overal bed mobility: Needs Assistance Bed Mobility: Supine to Sit     Supine to sit: Min assist     General bed mobility comments: Min assist to help progress left leg to EOB.  Verbal cues for 1/2 bridge technique.   Transfers Overall transfer level: Needs assistance Equipment used: Rolling walker (2 wheeled) Transfers: Sit to/from Stand Sit to Stand: +2 physical assistance;Min assist         General transfer comment: Two person min assist to power up from elevated bed to RW.  Verbal cues for safe hand placement.    Ambulation/Gait Ambulation/Gait assistance: Min assist Ambulation Distance (Feet): 120 Feet Assistive device: Rolling walker (2 wheeled) Gait Pattern/deviations: Step-through pattern;Antalgic;Trunk flexed Gait velocity: decreased Gait velocity interpretation: Below normal speed for age/gender General Gait Details: Pt continues to rely heavily on his arms.  Gait pattern and foot clearance  better with KI off today (he was able to do a solid SLR).  Chair to follow to encourage increased gait distance, but not actually needed.           Balance Overall balance assessment: Needs assistance Sitting-balance support: Feet supported;No upper extremity supported Sitting balance-Leahy Scale: Good     Standing balance support: Bilateral upper extremity supported Standing balance-Leahy Scale: Poor                      Cognition Arousal/Alertness: Awake/alert Behavior During Therapy: WFL for tasks assessed/performed Overall Cognitive Status: Within Functional Limits for tasks assessed                      Exercises Total Joint Exercises Long Arc Quad: AROM;Left;20 reps;Seated Knee Flexion: AROM;AAROM;Left;20 reps;Seated Goniometric ROM: 8-90 AAROM flexion in sitting        Pertinent Vitals/Pain Pain Assessment: 0-10 Pain Score: 5  Pain Location: left knee Pain Descriptors / Indicators: Aching Pain Intervention(s): Monitored during session           PT Goals (current goals can now be found in the care plan section) Acute Rehab PT Goals Patient Stated Goal: to go home Progress towards PT goals: Progressing toward goals    Frequency  7X/week    PT Plan Current plan remains appropriate       End of Session   Activity Tolerance: Patient limited by fatigue;Patient limited by pain Patient left: in chair;with call bell/phone within reach     Time: 1110-1140 PT Time Calculation (min) (ACUTE  ONLY): 30 min  Charges:  $Gait Training: 8-22 mins $Therapeutic Exercise: 8-22 mins                      Hailynn Slovacek B. Merryl Buckels, PT, DPT [phone]   04/21/2014, 1:14 PM

## 2014-04-22 NOTE — Clinical Social Work Note (Signed)
CSW spoke with patient's daughter Lemmie Evens, who is decision maker assisting patient with placement.  CSW reviewed bed offers with Lemmie Evens who reports patient wishes to receive STR at [facility].  CSW confirmed bed at [facility].  Lemmie Evens wishes for patient's wife [phone]) to also receive STR at [facility].  CSW will inform unit CSW of situation and request.  Miquel Dunn is aware.  Patient will discharge today.  Lemmie Evens is aware and agreeable.  Nonnie Done, [place] [phone]  Psychiatric & Orthopedics (5N 1-16) Clinical Social Worker

## 2014-04-22 NOTE — Discharge Planning (Signed)
Patient will discharge today per MD order. Patient will discharge to [facility] SNF RN to call report prior to transportation to [phone] Transportation: [facility]- anticipated discharge scheduled between 2pm-4pm   CSW sent discharge summary to SNF for review.  Packet is complete.  RN, patient and family aware of discharge plans. Insurance authorization submitted.   Nonnie Done, [place] [phone]  Psychiatric & Orthopedics (5N 1-16) Clinical Social Worker

## 2014-04-22 NOTE — Progress Notes (Addendum)
Occupational Therapy Treatment Patient Details Name: Ricky Lee MRN: [id number] DOB: Aug 21, 1933 Today's Date: 04/22/2014    History of present illness Patient is a 81 yo male admitted 04/17/14 now s/p Lt TKA.  PMH:  CAD, MI, HTN, OSA, arthritis, Rt TKA in 1999, Rt reverse TSA 2013   OT comments  Pt progressing. Education provided in session. Pt planning to d/c to SNF.   Follow Up Recommendations  SNF;Supervision/Assistance - 24 hour    Equipment Recommendations  3 in 1 bedside comode    Recommendations for Other Services      Precautions / Restrictions Precautions Precautions: Knee;Fall Precaution Booklet Issued: No Precaution Comments: educated on no twisting knee Required Braces or Orthoses:  (d/c by PT) Restrictions Weight Bearing Restrictions: Yes LLE Weight Bearing: Weight bearing as tolerated       Mobility Bed Mobility               General bed mobility comments: not assessed  Transfers Overall transfer level: Needs assistance Equipment used: Rolling walker (2 wheeled) Transfers: Sit to/from Stand Sit to Stand: Min guard         General transfer comment: cues for technique/hand placement.    Balance    Min guard for ambulation with RW.                               ADL Overall ADL's : Needs assistance/impaired     Grooming: Wash/dry hands;Set up;Sitting               Lower Body Dressing: Minimal assistance;With adaptive equipment;Sitting/lateral leans (Lt sock)   Toilet Transfer: Min guard;Ambulation;RW;Comfort height toilet;Grab bars   Toileting- Clothing Manipulation and Hygiene: Moderate assistance;Sit to/from stand       Functional mobility during ADLs: Min guard;Rolling walker General ADL Comments: Educated on LB dressing technique. Educated on benefit of reaching to left foot for LB ADLs as it allows knee to bend. Educated on AE and pt practiced with sockaid. Pt donned/doffed sock and also donned underwear.  Educated on safety such as sitting for most of LB dressing and use of bag on walker. Educated on what pt could use for toilet aide for hygiene. Educated on deep breathing technique.      Vision                     Perception     Praxis      Cognition  Awake/Alert Behavior During Therapy: WFL for tasks assessed/performed Overall Cognitive Status: Within Functional Limits for tasks assessed       Memory: Decreased short-term memory               Extremity/Trunk Assessment               Exercises     Shoulder Instructions       General Comments      Pertinent Vitals/ Pain       Pain Assessment: 0-10 Pain Score: 7  Pain Location: Lt knee Pain Descriptors / Indicators: Aching Pain Intervention(s): Repositioned;Monitored during session  Home Living                                          Prior Functioning/Environment              Frequency Min 3X/week  Progress Toward Goals  OT Goals(current goals can now be found in the care plan section)  Progress towards OT goals: Progressing toward goals  Acute Rehab OT Goals Patient Stated Goal: not stated OT Goal Formulation: With patient Time For Goal Achievement: 04/25/14 Potential to Achieve Goals: Good ADL Goals Pt Will Perform Lower Body Bathing: with min guard assist;sit to/from stand Pt Will Perform Lower Body Dressing: with min guard assist;with adaptive equipment;sit to/from stand Pt Will Transfer to Toilet: with min guard assist;ambulating (3 in 1 over toilet) Pt Will Perform Toileting - Clothing Manipulation and hygiene: with min guard assist;sit to/from stand Pt Will Perform Tub/Shower Transfer: with min guard assist;ambulating;3 in 1;rolling walker  Plan Discharge plan remains appropriate    Co-evaluation                 End of Session Equipment Utilized During Treatment: Gait belt;Rolling walker CPM Left Knee CPM Left Knee: Off   Activity Tolerance  Patient limited by fatigue;Patient limited by pain   Patient Left in chair;with call bell/phone within reach   Nurse Communication          Time: 7616-0737 OT Time Calculation (min): 17 min (some time spent urinating/BM on toilet)  Charges: OT General Charges $OT Visit: 1 Procedure OT Treatments $Self Care/Home Management : 8-22 mins  Benito Mccreedy OTR/L [phone] 04/22/2014, 12:40 PM

## 2014-04-22 NOTE — Progress Notes (Signed)
Orthopedics Progress Note  Subjective: I am doing well  Objective:  Filed Vitals:   04/22/14 0544  BP: 114/100  Pulse: 106  Temp: 98.2 F (36.8 C)  Resp: 19    General: Awake and alert  Musculoskeletal: left knee wound benign. Dressing changed to Mepilex. No cords Neurovascularly intact  Lab Results  Component Value Date   WBC 10.2 04/20/2014   HGB 10.1* 04/20/2014   HCT 30.1* 04/20/2014   MCV 92.9 04/20/2014   PLT 150 04/20/2014       Component Value Date/Time   NA 136 04/18/2014 0600   K 4.8 04/18/2014 0600   CL 101 04/18/2014 0600   CO2 30 04/18/2014 0600   GLUCOSE 128* 04/18/2014 0600   BUN 18 04/18/2014 0600   CREATININE 1.22 04/18/2014 0600   CALCIUM 8.9 04/18/2014 0600   GFRNONAA 55* 04/18/2014 0600   GFRAA 63* 04/18/2014 0600    Lab Results  Component Value Date   INR 1.03 04/07/2014   INR 0.94 11/10/2009    Assessment/Plan: POD #4 s/p Procedure(s): LEFT TOTAL KNEE ARTHROPLASTY Stable for D/C to SNF rehab when bed available  Remo Lipps R. [name], MD 04/22/2014 7:44 AM

## 2014-04-22 NOTE — Progress Notes (Signed)
Physical Therapy Treatment Patient Details Name: Ricky Lee MRN: [id number] DOB: May 09, 1933 Today's Date: 04/22/2014    History of Present Illness Patient is a 81 yo male admitted 04/17/14 now s/p Lt TKA.  PMH:  CAD, MI, HTN, OSA, arthritis, Rt TKA in 1999, Rt reverse TSA 2013    PT Comments    Pt continues to progress daily with less antalgic gait pattern and better knee mobility and ROM.  He is scheduled to d/c to SNF today for continued therapy before returning home with elderly and also hospitalized wife.    Follow Up Recommendations  SNF     Equipment Recommendations  None recommended by PT    Recommendations for Other Services   NA     Precautions / Restrictions Precautions Precautions: Knee;Fall Precaution Booklet Issued: No Precaution Comments: reviewed no pillow under surgical knee Required Braces or Orthoses:  (d/c) Restrictions LLE Weight Bearing: Weight bearing as tolerated    Mobility  Bed Mobility Overal bed mobility: Needs Assistance Bed Mobility: Supine to Sit     Supine to sit: Min guard     General bed mobility comments: Min guard assist to help progress leg over EOB to floor  Transfers Overall transfer level: Needs assistance Equipment used: Rolling walker (2 wheeled) Transfers: Sit to/from Stand Sit to Stand: Min guard         General transfer comment: Min guard assist for safety during transitions.  Verbal cues for safe hand placement.   Ambulation/Gait Ambulation/Gait assistance: Min guard Ambulation Distance (Feet): 120 Feet Assistive device: Rolling walker (2 wheeled) Gait Pattern/deviations: Step-through pattern;Antalgic;Trunk flexed Gait velocity: decreased Gait velocity interpretation: Below normal speed for age/gender General Gait Details: Pt with mildly antalgic gait pattern, continues to need min guard assist for safety and balance as he often gets his RW wheels stuck on obstacles.  Verbal cues for upright posture.            Balance Overall balance assessment: Needs assistance Sitting-balance support: Feet supported Sitting balance-Leahy Scale: Good     Standing balance support: Bilateral upper extremity supported;Single extremity supported;No upper extremity supported Standing balance-Leahy Scale: Fair                      Cognition Arousal/Alertness: Awake/alert Behavior During Therapy: WFL for tasks assessed/performed Overall Cognitive Status: Within Functional Limits for tasks assessed                      Exercises Total Joint Exercises Long Arc Quad: AROM;Left;10 reps;Seated Knee Flexion: AROM;AAROM;Left;10 reps;Seated Goniometric ROM: 8-95 AAROM in sitting        Pertinent Vitals/Pain Pain Assessment: 0-10 Pain Score: 5  Pain Location: left knee Pain Descriptors / Indicators: Aching;Burning Pain Intervention(s): Limited activity within patient's tolerance;Monitored during session;Repositioned           PT Goals (current goals can now be found in the care plan section) Acute Rehab PT Goals Patient Stated Goal: to go to rehab so he can get home soon Progress towards PT goals: Progressing toward goals    Frequency  7X/week    PT Plan Current plan remains appropriate       End of Session   Activity Tolerance: Patient limited by fatigue Patient left: in chair;with call bell/phone within reach     Time: 1004-1034 PT Time Calculation (min) (ACUTE ONLY): 30 min  Charges:  $Gait Training: 8-22 mins $Therapeutic Exercise: 8-22 mins  Barbarann Ehlers [place], [place], DPT [phone]   04/22/2014, 5:52 PM

## 2014-04-22 NOTE — Progress Notes (Signed)
Report called to [facility] at [facility] at 16:04.

## 2014-04-23 ENCOUNTER — Non-Acute Institutional Stay (SKILLED_NURSING_FACILITY): Payer: Medicare Other | Admitting: Internal Medicine

## 2014-04-23 DIAGNOSIS — E785 Hyperlipidemia, unspecified: Secondary | ICD-10-CM | POA: Diagnosis not present

## 2014-04-23 DIAGNOSIS — I10 Essential (primary) hypertension: Secondary | ICD-10-CM | POA: Diagnosis not present

## 2014-04-23 DIAGNOSIS — K5900 Constipation, unspecified: Secondary | ICD-10-CM

## 2014-04-23 DIAGNOSIS — M1712 Unilateral primary osteoarthritis, left knee: Secondary | ICD-10-CM | POA: Diagnosis not present

## 2014-04-27 LAB — CBC AND DIFFERENTIAL
HCT: 30 % — AB (ref 41–53)
HEMOGLOBIN: 9.9 g/dL — AB (ref 13.5–17.5)
Platelets: 306 10*3/uL (ref 150–399)
WBC: 6.9 10^3/mL

## 2014-04-27 LAB — BASIC METABOLIC PANEL
BUN: 20 mg/dL (ref 4–21)
Creatinine: 1.2 mg/dL (ref 0.6–1.3)
Potassium: 3.7 mmol/L (ref 3.4–5.3)
SODIUM: 140 mmol/L (ref 137–147)

## 2014-04-29 ENCOUNTER — Encounter: Payer: Self-pay | Admitting: Registered Nurse

## 2014-04-29 ENCOUNTER — Non-Acute Institutional Stay (SKILLED_NURSING_FACILITY): Payer: Medicare Other | Admitting: Registered Nurse

## 2014-04-29 DIAGNOSIS — D62 Acute posthemorrhagic anemia: Secondary | ICD-10-CM

## 2014-04-29 DIAGNOSIS — I10 Essential (primary) hypertension: Secondary | ICD-10-CM | POA: Diagnosis not present

## 2014-04-29 DIAGNOSIS — M1712 Unilateral primary osteoarthritis, left knee: Secondary | ICD-10-CM

## 2014-04-29 DIAGNOSIS — E785 Hyperlipidemia, unspecified: Secondary | ICD-10-CM | POA: Diagnosis not present

## 2014-04-29 NOTE — Progress Notes (Signed)
Patient ID: Ricky Lee, male   DOB: Jul 11, 1933, 81 y.o.   MRN: [id number]   Place of Service: [facility]  Allergies  Allergen Reactions  . Coumadin [Warfarin]     Code Status: Full Code  Goals of Care: Longevity/STR  Chief Complaint  Patient presents with  . Discharge Note    HPI 81 y.o. male with PMH of OA, HTN, OA, HLD among others is being seen for a discharge visit. Patient was here for short-term rehabilitation post hospital admission from 04/17/14 to 04/21/14 for left knee OA s/p left TKA. Patient has worked with therapy team and is ready to be discharged home with [facility] PT/OT. Seen in room today. Reported pain is adequately controlled with tylenol and robaxin. Denies any other concerns.   Review of Systems Constitutional: Negative for fever, chills, and fatigue. HENT: Negative for ear pain, congestion, and sore throat Eyes: Negative for eye pain, eye discharge, and visual disturbance  Cardiovascular: Negative for chest pain, palpitations, and leg swelling Respiratory: Negative cough, shortness of breath, and wheezing.  Gastrointestinal: Negative for nausea and vomiting. Negative for abdominal pain, diarrhea and constipation.  Genitourinary: Negative for dysuria Musculoskeletal: Negative for back pain. Positive for left knee pain Neurological: Negative for dizziness and headache Skin: Negative for rash and itchiness  Psychiatric: Negative for depression   Past Medical History  Diagnosis Date  . Myocardial infarction 1984  . Hypertension   . Sleep apnea     possible - having sleep study 02/06/12  . Arthritis   . Hyperlipemia   . Coronary artery disease   . Cold     getting over a cold  . Cancer     oral cancer in past, skin cancer    Past Surgical History  Procedure Laterality Date  . Rotator cuff repair      right  . Tongue surgery      removal of cancer  . Leg surgery      right and leg  . Biceps tendon repair      left  . Cholecystectomy     . Cardiac catheterization    . Right foot surgery    . Tonsillectomy    . Coronary angioplasty  1984  . Total shoulder arthroplasty  01/19/2012    Procedure: TOTAL SHOULDER ARTHROPLASTY;  Surgeon: Augustin Schooling, MD;  Location: [place];  Service: Orthopedics;  Laterality: Right;  REVERSE TOTAL SHOULDER  . Irrigation and debridement shoulder  02/09/2012    Procedure: IRRIGATION AND DEBRIDEMENT SHOULDER;  Surgeon: Augustin Schooling, MD;  Location: [place];  Service: Orthopedics;  Laterality: Right;  Right Shoulder I&D with Poly Exchange, Removal of 42 +9 Standard Poly spacer  . Joint replacement  1999    right knee  . Joint replacement  2014    right shoulder  . Eye surgery Bilateral     cataracts  . Total knee arthroplasty Left 04/17/2014    Procedure: LEFT TOTAL KNEE ARTHROPLASTY;  Surgeon: Augustin Schooling, MD;  Location: [place];  Service: Orthopedics;  Laterality: Left;    History   Social History  . Marital Status: Married    Spouse Name: N/A  . Number of Children: N/A  . Years of Education: N/A   Occupational History  . Not on file.   Social History Main Topics  . Smoking status: Former Smoker -- 2.00 packs/day for 29 years    Types: Cigarettes    Quit date: 04/07/1982  . Smokeless tobacco:  Never Used  . Alcohol Use: Yes     Comment: occasional  . Drug Use: No  . Sexual Activity: Yes   Other Topics Concern  . Not on file   Social History Narrative    No family history on file.    Medication List       This list is accurate as of: 04/29/14  4:13 PM.  Always use your most recent med list.               acetaminophen 325 MG tablet  Commonly known as:  TYLENOL  Take 650 mg by mouth every 4 (four) hours as needed for mild pain or moderate pain.     aspirin 325 MG tablet  Take 325 mg by mouth daily.     docusate sodium 50 MG capsule  Commonly known as:  COLACE  Take 50 mg by mouth 2 (two) times daily.     enoxaparin 40 MG/0.4ML injection  Commonly known as:   LOVENOX  Inject 0.4 mLs (40 mg total) into the skin daily. 30 days post op     lisinopril 5 MG tablet  Commonly known as:  PRINIVIL,ZESTRIL  Take 5 mg by mouth daily.     methocarbamol 500 MG tablet  Commonly known as:  ROBAXIN  Take 1 tablet (500 mg total) by mouth 3 (three) times daily as needed.     metoprolol tartrate 25 MG tablet  Commonly known as:  LOPRESSOR  Take 25 mg by mouth 2 (two) times daily.     polyethylene glycol packet  Commonly known as:  MIRALAX / GLYCOLAX  Take 17 g by mouth daily as needed.     simvastatin 40 MG tablet  Commonly known as:  ZOCOR  Take 40 mg by mouth at bedtime.        Physical Exam  BP 124/78 mmHg  Pulse 82  Temp(Src) 98.3 F (36.8 C)  Resp 20  Wt 277 lb (125.646 kg)  Constitutional: WDWN elderly male in no acute distress. Conversant and pleasant HEENT: Normocephalic and atraumatic. PERRL. EOM intact. No icterus. No nasal discharge or sinus tenderness. Oral mucosa moist. Posterior pharynx clear of any exudate or lesions.  Neck: Supple and nontender. No lymphadenopathy, masses, or thyromegaly. No JVD or carotid bruits. Cardiac: Normal S1, S2. RRR without appreciable murmurs, rubs, or gallops. Distal pulses intact. Trace to 1+ pitting edema of BLE.  Lungs: No respiratory distress. Breath sounds clear bilaterally without rales, rhonchi, or wheezes. Abdomen: Audible bowel sounds in all quadrants. Soft, nontender, nondistended.  Musculoskeletal: able to move all extremities. Ambulates using straight cane. Left knee surgical dressing intact. Large bruise noted on LLE.   Skin: Warm and dry. No rash noted. Neurological: Alert and oriented to person, place, and time. No focal deficits.  Psychiatric: Judgment and insight adequate. Appropriate mood and affect.   Labs Reviewed  CBC Latest Ref Rng 04/27/2014 04/20/2014 04/19/2014  WBC - 6.9 10.2 11.1(H)  Hemoglobin 13.5 - 17.5 g/dL 9.9(A) 10.1(L) 11.0(L)  Hematocrit 41 - 53 % 30(A) 30.1(L)  32.2(L)  Platelets 150 - 399 K/L 306 150 145(L)    CMP Latest Ref Rng 04/27/2014 04/18/2014 04/17/2014  Glucose 70 - 99 mg/dL - 128(H) -  BUN 4 - 21 mg/dL 20 18 -  Creatinine 0.6 - 1.3 mg/dL 1.2 1.22 1.16  Sodium 137 - 147 mmol/L 140 136 -  Potassium 3.4 - 5.3 mmol/L 3.7 4.8 -  Chloride 96 - 112 mmol/L - 101 -  CO2 19 - 32 mmol/L - 30 -  Calcium 8.4 - 10.5 mg/dL - 8.9 -  Total Protein 6.0 - 8.3 g/dL - - -  Total Bilirubin 0.3 - 1.2 mg/dL - - -  Alkaline Phos 39 - 117 U/L - - -  AST 0 - 37 U/L - - -  ALT 0 - 53 U/L - - -    Assessment & Plan 1. Primary osteoarthritis of left knee S/p Left TKA. Continue to work with [facility] PT/OT for gait/strengtth/balance training to restore/maximize function. Continue tylenol 650mg  every 4 hours as needed for pain with robaxin 500mg  three times daily as needed for muscle spasms. Continue lovenox 40mg /0.4mL injection daily for DVT prophylasix until 05/17/14. Fall risk precautions. Continue to f/u with ortho as necessary  2. Essential hypertension Stable. Continue lopressor 25mg  twice daily with lisinopril 5mg  daily. F/u with pcp  3. HLD (hyperlipidemia) No issues. Continue zocor 40mg  daily.   4. Acute blood loss anemia Most likely post-op. Most recent h&h 9.9/29.8. pcp to monitor   Home health services:PT/OT DME required:None PCP follow-up: Wende Neighbors on 05/11/14 @ 10:00am 30-day supply of prescription medications provided (#30 robaxin 500mg )  Time spent: 35 minutes on care coordination and counseling   Family/Staff Communication Plan of care discussed with patient and nursing staff. Patient and nursing staff verbalized understanding and agree with plan of care. No additional questions or concerns reported.    Arthur Holms, MSN, AGNP-C [facility] [address] [place], [place] [address] [phone] [8am-5pm] After hours: [phone]

## 2014-05-03 NOTE — Progress Notes (Signed)
Patient ID: Ricky Lee, male   DOB: 1933-10-05, 81 y.o.   MRN: [id number]     Facility: [facility]    PCP: Wende Neighbors, MD    Allergies  Allergen Reactions  . Coumadin [Warfarin]     Chief Complaint  Patient presents with  . New Admit To SNF     HPI:  81 year old patient is here for short term rehabilitation post hospital admission from 04/17/14-04/21/14 with left knee OA. He underwent left total knee arthroplasty. He is seen in his room today. His pain is under control with current regimen, denies muscle spasm. He denies any concerns.   Review of Systems:  Constitutional: Negative for fever, chills, diaphoresis.  HENT: Negative for headache, congestion  Respiratory: Negative for cough, shortness of breath and wheezing.   Cardiovascular: Negative for chest pain, palpitations Gastrointestinal: Negative for heartburn, nausea, vomiting, abdominal pain. Had bowel movement today Genitourinary: Negative for dysuria  Musculoskeletal: Negative for back pain, falls Skin: Negative for itching, rash.  Neurological: Negative for dizziness, tingling, focal weakness Psychiatric/Behavioral: Negative for depression   Past Medical History  Diagnosis Date  . Myocardial infarction 1984  . Hypertension   . Sleep apnea     possible - having sleep study 02/06/12  . Arthritis   . Hyperlipemia   . Coronary artery disease   . Cold     getting over a cold  . Cancer     oral cancer in past, skin cancer   Past Surgical History  Procedure Laterality Date  . Rotator cuff repair      right  . Tongue surgery      removal of cancer  . Leg surgery      right and leg  . Biceps tendon repair      left  . Cholecystectomy    . Cardiac catheterization    . Right foot surgery    . Tonsillectomy    . Coronary angioplasty  1984  . Total shoulder arthroplasty  01/19/2012    Procedure: TOTAL SHOULDER ARTHROPLASTY;  Surgeon: Augustin Schooling, MD;  Location: [place];   Service: Orthopedics;  Laterality: Right;  REVERSE TOTAL SHOULDER  . Irrigation and debridement shoulder  02/09/2012    Procedure: IRRIGATION AND DEBRIDEMENT SHOULDER;  Surgeon: Augustin Schooling, MD;  Location: [place];  Service: Orthopedics;  Laterality: Right;  Right Shoulder I&D with Poly Exchange, Removal of 42 +9 Standard Poly spacer  . Joint replacement  1999    right knee  . Joint replacement  2014    right shoulder  . Eye surgery Bilateral     cataracts  . Total knee arthroplasty Left 04/17/2014    Procedure: LEFT TOTAL KNEE ARTHROPLASTY;  Surgeon: Augustin Schooling, MD;  Location: [place];  Service: Orthopedics;  Laterality: Left;   Social History:   reports that he quit smoking about 32 years ago. His smoking use included Cigarettes. He has a 58 pack-year smoking history. He has never used smokeless tobacco. He reports that he drinks alcohol. He reports that he does not use illicit drugs.  No family history on file.  Medications: Patient's Medications  New Prescriptions   No medications on file  Previous Medications   ACETAMINOPHEN (TYLENOL) 325 MG TABLET    Take 650 mg by mouth every 4 (four) hours as needed for mild pain or moderate pain.   ASPIRIN 325 MG TABLET    Take 325 mg by mouth daily.  DOCUSATE SODIUM (COLACE) 50 MG CAPSULE    Take 50 mg by mouth 2 (two) times daily.   ENOXAPARIN (LOVENOX) 40 MG/0.4ML INJECTION    Inject 0.4 mLs (40 mg total) into the skin daily. 30 days post op   LISINOPRIL (PRINIVIL,ZESTRIL) 5 MG TABLET    Take 5 mg by mouth daily.   METHOCARBAMOL (ROBAXIN) 500 MG TABLET    Take 1 tablet (500 mg total) by mouth 3 (three) times daily as needed.   METOPROLOL TARTRATE (LOPRESSOR) 25 MG TABLET    Take 25 mg by mouth 2 (two) times daily.   POLYETHYLENE GLYCOL (MIRALAX / GLYCOLAX) PACKET    Take 17 g by mouth daily as needed.   SIMVASTATIN (ZOCOR) 40 MG TABLET    Take 40 mg by mouth at bedtime.  Modified Medications   No medications on file  Discontinued  Medications   No medications on file     Physical Exam: Filed Vitals:   04/23/14 1639  BP: 122/70  Pulse: 88  Temp: 98.7 F (37.1 C)  Resp: 18  Weight: 277 lb (125.646 kg)  SpO2: 96%    General- elderly male, obese, in no acute distress Head- normocephalic, atraumatic Throat- moist mucus membrane Neck- no cervical lymphadenopathy Cardiovascular- normal s1,s2, no murmurs, palpable dorsalis pedis, 1+ left leg edema Respiratory- bilateral clear to auscultation, no wheeze, no rhonchi, no crackles, no use of accessory muscles Abdomen- bowel sounds present, soft, non tender Musculoskeletal- able to move all 4 extremities, left knee ROM limited Neurological- no focal deficit Skin- warm and dry, bruise noted, aquacel dressing over surgical incision area on left knee Psychiatry- alert and oriented to person, place and time, normal mood and affect    Labs reviewed: Basic Metabolic Panel:  Recent Labs  04/07/14 0933 04/17/14 1655 04/18/14 0600 04/27/14  NA 144  --  136 140  K 4.4  --  4.8 3.7  CL 112  --  101  --   CO2 27  --  30  --   GLUCOSE 98  --  128*  --   BUN 19  --  18 20  CREATININE 1.06 1.16 1.22 1.2  CALCIUM 9.4  --  8.9  --    CBC:  Recent Labs  04/18/14 0600 04/19/14 0610 04/20/14 0440 04/27/14  WBC 11.7* 11.1* 10.2 6.9  HGB 12.0* 11.0* 10.1* 9.9*  HCT 37.1* 32.2* 30.1* 30*  MCV 95.4 95.0 92.9  --   PLT 186 145* 150 306    Assessment/Plan  Left knee OA S/p left knee arthroplasty. Has f/u with orthopedics. Will have him work with physical therapy and occupational therapy team to help with gait training and muscle strengthening exercises.fall precautions. Skin care. Encourage to be out of bed. Continue aspirin and lovenox for dvt prophylaxis. Continue prn robaxin for muscle spasm and roxicet 5-325 1-2 tab q4h prn for pain  HTN Continue lisinopril 5 mg daily, lopressor 25 mg bid  Constipation Add colace 100 mg daily and miralax daily prn for  now  Hyperlipidemia Continue zocor 40 mg daily  Goals of care: short term rehabilitation   Labs/tests ordered: cbc with diff, cmp  Family/ staff Communication: reviewed care plan with patient and nursing supervisor    Blanchie Serve, MD  [facility] [phone] (Monday-Friday 8 am - 5 pm) [phone] (afterhours)

## 2015-02-16 ENCOUNTER — Encounter: Payer: Self-pay | Admitting: Family Medicine

## 2015-02-16 ENCOUNTER — Ambulatory Visit (INDEPENDENT_AMBULATORY_CARE_PROVIDER_SITE_OTHER): Payer: PPO | Admitting: Family Medicine

## 2015-02-16 VITALS — BP 140/92 | HR 98 | Temp 97.5°F | Ht 66.0 in | Wt 271.0 lb

## 2015-02-16 DIAGNOSIS — M150 Primary generalized (osteo)arthritis: Secondary | ICD-10-CM | POA: Diagnosis not present

## 2015-02-16 DIAGNOSIS — I25119 Atherosclerotic heart disease of native coronary artery with unspecified angina pectoris: Secondary | ICD-10-CM

## 2015-02-16 DIAGNOSIS — I10 Essential (primary) hypertension: Secondary | ICD-10-CM | POA: Diagnosis not present

## 2015-02-16 DIAGNOSIS — M159 Polyosteoarthritis, unspecified: Secondary | ICD-10-CM

## 2015-02-16 DIAGNOSIS — Z85810 Personal history of malignant neoplasm of tongue: Secondary | ICD-10-CM | POA: Diagnosis not present

## 2015-02-16 DIAGNOSIS — I252 Old myocardial infarction: Secondary | ICD-10-CM

## 2015-02-16 DIAGNOSIS — E785 Hyperlipidemia, unspecified: Secondary | ICD-10-CM | POA: Diagnosis not present

## 2015-02-16 DIAGNOSIS — M8949 Other hypertrophic osteoarthropathy, multiple sites: Secondary | ICD-10-CM

## 2015-02-16 NOTE — Patient Instructions (Addendum)
Do give your heart doctor a call and explain your symptoms to him; you may need a sleep study or further cardiology work-up Your goal blood pressure is less than 140 mmHg on top. Try to follow the DASH guidelines (DASH stands for Dietary Approaches to Stop Hypertension) Try to limit the sodium in your diet.  Ideally, consume less than 1.5 grams (less than 1,500mg ) per day ideally, under 2,000 mg for sure. Do not add salt when cooking or at the table.  Check the sodium amount on labels when shopping, and choose items lower in sodium when given a choice. Avoid or limit foods that already contain a lot of sodium. Eat a diet rich in fruits and vegetables and whole grains. Do try to lose weight weight, easier said than done, but little changes will add up GINA--> request records from last doctor (last two years only), along with last year of labs and any echo and imaging reports  DASH Eating Plan DASH stands for "Dietary Approaches to Stop Hypertension." The DASH eating plan is a healthy eating plan that has been shown to reduce high blood pressure (hypertension). Additional health benefits may include reducing the risk of type 2 diabetes mellitus, heart disease, and stroke. The DASH eating plan may also help with weight loss. WHAT DO I NEED TO KNOW ABOUT THE DASH EATING PLAN? For the DASH eating plan, you will follow these general guidelines:  Choose foods with a percent daily value for sodium of less than 5% (as listed on the food label).  Use salt-free seasonings or herbs instead of table salt or sea salt.  Check with your health care provider or pharmacist before using salt substitutes.  Eat lower-sodium products, often labeled as "lower sodium" or "no salt added."  Eat fresh foods.  Eat more vegetables, fruits, and low-fat dairy products.  Choose whole grains. Look for the word "whole" as the first word in the ingredient list.  Choose fish and skinless chicken or [place] more often than red  meat. Limit fish, poultry, and meat to 6 oz (170 g) each day.  Limit sweets, desserts, sugars, and sugary drinks.  Choose heart-healthy fats.  Limit cheese to 1 oz (28 g) per day.  Eat more home-cooked food and less restaurant, buffet, and fast food.  Limit fried foods.  Cook foods using methods other than frying.  Limit canned vegetables. If you do use them, rinse them well to decrease the sodium.  When eating at a restaurant, ask that your food be prepared with less salt, or no salt if possible. WHAT FOODS CAN I EAT? Seek help from a dietitian for individual calorie needs. Grains Whole grain or whole wheat bread. Brown rice. Whole grain or whole wheat pasta. Quinoa, bulgur, and whole grain cereals. Low-sodium cereals. Corn or whole wheat flour tortillas. Whole grain cornbread. Whole grain crackers. Low-sodium crackers. Vegetables Fresh or frozen vegetables (raw, steamed, roasted, or grilled). Low-sodium or reduced-sodium tomato and vegetable juices. Low-sodium or reduced-sodium tomato sauce and paste. Low-sodium or reduced-sodium canned vegetables.  Fruits All fresh, canned (in natural juice), or frozen fruits. Meat and Other Protein Products Ground beef (85% or leaner), grass-fed beef, or beef trimmed of fat. Skinless chicken or [place]. Ground chicken or [place]. Pork trimmed of fat. All fish and seafood. Eggs. Dried beans, peas, or lentils. Unsalted nuts and seeds. Unsalted canned beans. Dairy Low-fat dairy products, such as skim or 1% milk, 2% or reduced-fat cheeses, low-fat ricotta or cottage cheese, or plain low-fat yogurt.  Low-sodium or reduced-sodium cheeses. Fats and Oils Tub margarines without trans fats. Light or reduced-fat mayonnaise and salad dressings (reduced sodium). Avocado. Safflower, olive, or canola oils. Natural peanut or almond butter. Other Unsalted popcorn and pretzels. The items listed above may not be a complete list of recommended foods or beverages.  Contact your dietitian for more options. WHAT FOODS ARE NOT RECOMMENDED? Grains White bread. White pasta. White rice. Refined cornbread. Bagels and croissants. Crackers that contain trans fat. Vegetables Creamed or fried vegetables. Vegetables in a cheese sauce. Regular canned vegetables. Regular canned tomato sauce and paste. Regular tomato and vegetable juices. Fruits Dried fruits. Canned fruit in light or heavy syrup. Fruit juice. Meat and Other Protein Products Fatty cuts of meat. Ribs, chicken wings, bacon, sausage, bologna, salami, chitterlings, fatback, hot dogs, bratwurst, and packaged luncheon meats. Salted nuts and seeds. Canned beans with salt. Dairy Whole or 2% milk, cream, half-and-half, and cream cheese. Whole-fat or sweetened yogurt. Full-fat cheeses or blue cheese. Nondairy creamers and whipped toppings. Processed cheese, cheese spreads, or cheese curds. Condiments Onion and garlic salt, seasoned salt, table salt, and sea salt. Canned and packaged gravies. Worcestershire sauce. Tartar sauce. Barbecue sauce. Teriyaki sauce. Soy sauce, including reduced sodium. Steak sauce. Fish sauce. Oyster sauce. Cocktail sauce. Horseradish. Ketchup and mustard. Meat flavorings and tenderizers. Bouillon cubes. Hot sauce. Tabasco sauce. Marinades. Taco seasonings. Relishes. Fats and Oils Butter, stick margarine, lard, shortening, ghee, and bacon fat. Coconut, palm kernel, or palm oils. Regular salad dressings. Other Pickles and olives. Salted popcorn and pretzels. The items listed above may not be a complete list of foods and beverages to avoid. Contact your dietitian for more information. WHERE CAN I FIND MORE INFORMATION? National Heart, Lung, and Blood Institute: travelstabloid.com   This information is not intended to replace advice given to you by your health care provider. Make sure you discuss any questions you have with your health care provider.    Document Released: 02/02/2011 Document Revised: 03/06/2014 Document Reviewed: 12/18/2012 Elsevier Interactive Patient Education [employer].

## 2015-02-16 NOTE — Progress Notes (Signed)
BP 140/92 mmHg  Pulse 98  Temp(Src) 97.5 F (36.4 C)  Ht 5\' 6"  (1.676 m)  Wt 271 lb (122.925 kg)  BMI 43.76 kg/m2  SpO2 97%   Subjective:    Patient ID: Ricky Lee, male    DOB: 05-21-1933, 81 y.o.   MRN: [id number]  HPI: Ricky Lee is a 81 y.o. male  Chief Complaint  Patient presents with  . Establish Care   He is here to establish care He has a history of heart disease; had angioplasty in 1985; Dr. Serafina Royals is his cardiologist He has arthritis, seen by orthopaedist, Dr. [name]; has some difficulty with walking; has not been walking much, affects his legs  He has a hx of tongue cancer, as well as basal cell cancer x 2 on the nose, one on side, one on the tip  Hypertension; his BP is higher than usual today, might be coming to the doctor; 120/80 usually at home; he loves his salt; does eat bacon and sausage; not much red meat  He has been at this weight for 25 years; he declined nutritionist referral today  Relevant past medical, surgical, family and social history reviewed and updated as indicated. Past Medical History  Diagnosis Date  . Hypertension   . Hyperlipemia   . Coronary artery disease   . Arthritis   . OA (osteoarthritis)     ankle/foot/knee/shoulder  . Trigger little finger of left hand   . Degenerative lumbar disc   . Myocardial infarction ([place]) 1984  . Cancer ([place])     oral cancer in past, skin cancer  . Sleep apnea    Past Surgical History  Procedure Laterality Date  . Rotator cuff repair      right  . Tongue surgery      removal of cancer  . Leg surgery      right and leg  . Biceps tendon repair      left  . Cholecystectomy    . Cardiac catheterization    . Right foot surgery    . Tonsillectomy    . Coronary angioplasty  1984  . Total shoulder arthroplasty  01/19/2012    Procedure: TOTAL SHOULDER ARTHROPLASTY;  Surgeon: Augustin Schooling, MD;  Location: [place];  Service: Orthopedics;  Laterality: Right;  REVERSE TOTAL  SHOULDER  . Irrigation and debridement shoulder  02/09/2012    Procedure: IRRIGATION AND DEBRIDEMENT SHOULDER;  Surgeon: Augustin Schooling, MD;  Location: [place];  Service: Orthopedics;  Laterality: Right;  Right Shoulder I&D with Poly Exchange, Removal of 42 +9 Standard Poly spacer  . Joint replacement  1999    right knee  . Joint replacement  2014    right shoulder  . Eye surgery Bilateral     cataracts  . Total knee arthroplasty Left 04/17/2014    Procedure: LEFT TOTAL KNEE ARTHROPLASTY;  Surgeon: Augustin Schooling, MD;  Location: [place];  Service: Orthopedics;  Laterality: Left;  He quit smoking when he had his heart attack  Family History  Problem Relation Age of Onset  . Alzheimer's disease Father   . Hypertension Brother   . Heart disease Brother   . Cancer Maternal Grandmother     male  . Cancer Paternal Grandmother   . COPD Neg Hx   . Diabetes Neg Hx   . Stroke Neg Hx    Review of Systems  Constitutional: Negative for unexpected weight change.  HENT: Negative for sore throat.   Eyes:  Negative for visual disturbance (had cataracts removed in [place]).  Respiratory: Positive for chest tightness (sleeps on his back all night long; all gone early morning after getting up and moving around; he thinks it is how he's sleeping; 9 pm to 6 am).   Cardiovascular: Negative for chest pain, palpitations and leg swelling (just the toes and feet will swell when up for a long time).  Gastrointestinal: Negative for blood in stool.  Genitourinary: Negative for hematuria, decreased urine volume and difficulty urinating.  Musculoskeletal: Positive for joint swelling and arthralgias.  Allergic/Immunologic: Negative for food allergies (just doesn't eat fish or chicken, not true allergy).  Neurological: Negative for headaches.  Hematological: Bruises/bleeds easily (on full dose aspirin).  Psychiatric/Behavioral: Negative for dysphoric mood. The patient is not nervous/anxious.   Per HPI unless  specifically indicated above     Objective:    BP 140/92 mmHg  Pulse 98  Temp(Src) 97.5 F (36.4 C)  Ht 5\' 6"  (1.676 m)  Wt 271 lb (122.925 kg)  BMI 43.76 kg/m2  SpO2 97%  Wt Readings from Last 3 Encounters:  02/16/15 271 lb (122.925 kg)  04/29/14 277 lb (125.646 kg)  04/23/14 277 lb (125.646 kg)    Physical Exam  Constitutional: He appears well-developed and well-nourished.  Morbidly obese  Eyes: EOM are normal. No scleral icterus.  Neck: No JVD present.  Cardiovascular: Normal rate and regular rhythm.   Pulmonary/Chest: Effort normal and breath sounds normal.  Abdominal: He exhibits no distension.  Musculoskeletal:       Right shoulder: He exhibits decreased range of motion. He exhibits no tenderness.       Right hand: He exhibits decreased range of motion. He exhibits no tenderness.       Left hand: He exhibits decreased range of motion. He exhibits no tenderness.  Right-handed; right pinky will not flex  Neurological: He is alert.  Skin: Skin is warm and dry.  Psychiatric: He has a normal mood and affect. His behavior is normal. Judgment and thought content normal.      Assessment & Plan:   Problem List Items Addressed This Visit      Cardiovascular and Mediastinum   Essential hypertension, benign - Primary    Patient reports BP much better controlled than today; weight loss would help; no changes in medicine; encoruaged the DASH guidelines      Relevant Medications   pravastatin (PRAVACHOL) 40 MG tablet   Coronary artery disease    I encouraged patient to talk with his cardiologist about his symptoms, may need furrther cardiac work-up of another sleep study      Relevant Medications   pravastatin (PRAVACHOL) 40 MG tablet     Musculoskeletal and Integument   Osteoarthritis    Under the care of orthopaedist      Relevant Medications   naproxen sodium (ANAPROX) 220 MG tablet     Other   Morbid obesity ([place])    Patient declined referral to  nutritionist      Hyperlipidemia    Will plan to see patient back in about 3 months, check lipids fasting at that time if not already done by cardiologist; weight loss would help; patient declined referral to nutritionist today      Relevant Medications   pravastatin (PRAVACHOL) 40 MG tablet   History of heart attack   History of tongue cancer       Follow up plan: Return in about 3 months (around 05/17/2015) for 30 minute follow-up, with fasting  labs.  An after-visit summary was printed and given to the patient at [place].  Please see the patient instructions which may contain other information and recommendations beyond what is mentioned above in the assessment and plan. Face-to-face time with patient was more than 30 minutes, >50% time spent counseling and coordination of care

## 2015-02-21 ENCOUNTER — Encounter: Payer: Self-pay | Admitting: Family Medicine

## 2015-02-21 DIAGNOSIS — I252 Old myocardial infarction: Secondary | ICD-10-CM | POA: Insufficient documentation

## 2015-02-21 DIAGNOSIS — I2510 Atherosclerotic heart disease of native coronary artery without angina pectoris: Secondary | ICD-10-CM | POA: Insufficient documentation

## 2015-02-21 DIAGNOSIS — M1990 Unspecified osteoarthritis, unspecified site: Secondary | ICD-10-CM | POA: Insufficient documentation

## 2015-02-21 DIAGNOSIS — E785 Hyperlipidemia, unspecified: Secondary | ICD-10-CM | POA: Insufficient documentation

## 2015-02-21 DIAGNOSIS — Z85810 Personal history of malignant neoplasm of tongue: Secondary | ICD-10-CM | POA: Insufficient documentation

## 2015-02-21 NOTE — Assessment & Plan Note (Signed)
Patient declined referral to nutritionist

## 2015-02-21 NOTE — Assessment & Plan Note (Signed)
Patient reports BP much better controlled than today; weight loss would help; no changes in medicine; encoruaged the DASH guidelines

## 2015-02-21 NOTE — Assessment & Plan Note (Signed)
I encouraged patient to talk with his cardiologist about his symptoms, may need furrther cardiac work-up of another sleep study

## 2015-02-21 NOTE — Assessment & Plan Note (Signed)
Will plan to see patient back in about 3 months, check lipids fasting at that time if not already done by cardiologist; weight loss would help; patient declined referral to nutritionist today

## 2015-02-21 NOTE — Assessment & Plan Note (Signed)
Under the care of orthopaedist

## 2015-05-05 ENCOUNTER — Encounter: Payer: Self-pay | Admitting: Family Medicine

## 2015-05-05 DIAGNOSIS — M810 Age-related osteoporosis without current pathological fracture: Secondary | ICD-10-CM | POA: Insufficient documentation

## 2015-05-05 DIAGNOSIS — R7301 Impaired fasting glucose: Secondary | ICD-10-CM | POA: Insufficient documentation

## 2015-05-05 DIAGNOSIS — E559 Vitamin D deficiency, unspecified: Secondary | ICD-10-CM | POA: Insufficient documentation

## 2015-05-05 HISTORY — DX: Age-related osteoporosis without current pathological fracture: M81.0

## 2015-05-05 HISTORY — DX: Vitamin D deficiency, unspecified: E55.9

## 2015-05-17 ENCOUNTER — Other Ambulatory Visit: Payer: Self-pay | Admitting: Family Medicine

## 2015-05-17 ENCOUNTER — Other Ambulatory Visit: Payer: PPO

## 2015-05-17 DIAGNOSIS — I10 Essential (primary) hypertension: Secondary | ICD-10-CM

## 2015-05-17 DIAGNOSIS — Z5181 Encounter for therapeutic drug level monitoring: Secondary | ICD-10-CM

## 2015-05-17 DIAGNOSIS — D62 Acute posthemorrhagic anemia: Secondary | ICD-10-CM

## 2015-05-17 DIAGNOSIS — E785 Hyperlipidemia, unspecified: Secondary | ICD-10-CM

## 2015-05-17 DIAGNOSIS — R7301 Impaired fasting glucose: Secondary | ICD-10-CM

## 2015-05-17 DIAGNOSIS — E559 Vitamin D deficiency, unspecified: Secondary | ICD-10-CM

## 2015-05-17 NOTE — Assessment & Plan Note (Signed)
Monitor urine microalb:Cr

## 2015-05-17 NOTE — Assessment & Plan Note (Signed)
Check level and supplement if needed 

## 2015-05-17 NOTE — Assessment & Plan Note (Signed)
Check a1c, glucose

## 2015-05-17 NOTE — Progress Notes (Signed)
I am out sick today, but entering lab orders if patient wishes to come in for labs

## 2015-05-17 NOTE — Assessment & Plan Note (Signed)
Check lipids 

## 2015-05-18 LAB — CBC WITH DIFFERENTIAL/PLATELET
BASOS: 0 %
Basophils Absolute: 0 10*3/uL (ref 0.0–0.2)
EOS (ABSOLUTE): 0.1 10*3/uL (ref 0.0–0.4)
Eos: 2 %
Hematocrit: 41.7 % (ref 37.5–51.0)
Hemoglobin: 14 g/dL (ref 12.6–17.7)
Immature Grans (Abs): 0 10*3/uL (ref 0.0–0.1)
Immature Granulocytes: 0 %
Lymphocytes Absolute: 1.7 10*3/uL (ref 0.7–3.1)
Lymphs: 26 %
MCH: 31.4 pg (ref 26.6–33.0)
MCHC: 33.6 g/dL (ref 31.5–35.7)
MCV: 94 fL (ref 79–97)
MONOS ABS: 0.8 10*3/uL (ref 0.1–0.9)
Monocytes: 12 %
Neutrophils Absolute: 3.9 10*3/uL (ref 1.4–7.0)
Neutrophils: 60 %
Platelets: 194 10*3/uL (ref 150–379)
RBC: 4.46 x10E6/uL (ref 4.14–5.80)
RDW: 13.6 % (ref 12.3–15.4)
WBC: 6.5 10*3/uL (ref 3.4–10.8)

## 2015-05-18 LAB — COMPREHENSIVE METABOLIC PANEL
ALBUMIN: 4.2 g/dL (ref 3.5–4.7)
ALT: 16 IU/L (ref 0–44)
AST: 26 IU/L (ref 0–40)
Albumin/Globulin Ratio: 1.7 (ref 1.2–2.2)
Alkaline Phosphatase: 65 IU/L (ref 39–117)
BILIRUBIN TOTAL: 0.8 mg/dL (ref 0.0–1.2)
BUN / CREAT RATIO: 16 (ref 10–22)
BUN: 18 mg/dL (ref 8–27)
CALCIUM: 9.5 mg/dL (ref 8.6–10.2)
CO2: 24 mmol/L (ref 18–29)
CREATININE: 1.14 mg/dL (ref 0.76–1.27)
Chloride: 100 mmol/L (ref 96–106)
GFR, EST AFRICAN AMERICAN: 70 mL/min/{1.73_m2} (ref >59)
GFR, EST NON AFRICAN AMERICAN: 60 mL/min/{1.73_m2} (ref >59)
GLUCOSE: 107 mg/dL — AB (ref 65–99)
Globulin, Total: 2.5 g/dL (ref 1.5–4.5)
Potassium: 4.8 mmol/L (ref 3.5–5.2)
Sodium: 142 mmol/L (ref 134–144)
TOTAL PROTEIN: 6.7 g/dL (ref 6.0–8.5)

## 2015-05-18 LAB — LIPID PANEL W/O CHOL/HDL RATIO
Cholesterol, Total: 211 mg/dL — ABNORMAL HIGH (ref 100–199)
HDL: 69 mg/dL (ref >39)
LDL Calculated: 117 mg/dL — ABNORMAL HIGH (ref 0–99)
Triglycerides: 124 mg/dL (ref 0–149)
VLDL Cholesterol Cal: 25 mg/dL (ref 5–40)

## 2015-05-18 LAB — VITAMIN D 25 HYDROXY (VIT D DEFICIENCY, FRACTURES): Vit D, 25-Hydroxy: 32.1 ng/mL (ref 30.0–100.0)

## 2015-05-18 LAB — HGB A1C W/O EAG: Hgb A1c MFr Bld: 5.9 % — ABNORMAL HIGH (ref 4.8–5.6)

## 2015-05-20 ENCOUNTER — Ambulatory Visit: Payer: PPO | Admitting: Family Medicine

## 2015-05-25 ENCOUNTER — Encounter: Payer: Self-pay | Admitting: Family Medicine

## 2015-05-25 ENCOUNTER — Ambulatory Visit (INDEPENDENT_AMBULATORY_CARE_PROVIDER_SITE_OTHER): Payer: PPO | Admitting: Family Medicine

## 2015-05-25 VITALS — BP 125/87 | HR 84 | Temp 97.6°F | Ht 67.0 in | Wt 278.6 lb

## 2015-05-25 DIAGNOSIS — E785 Hyperlipidemia, unspecified: Secondary | ICD-10-CM | POA: Diagnosis not present

## 2015-05-25 DIAGNOSIS — R7301 Impaired fasting glucose: Secondary | ICD-10-CM | POA: Diagnosis not present

## 2015-05-25 DIAGNOSIS — E559 Vitamin D deficiency, unspecified: Secondary | ICD-10-CM

## 2015-05-25 DIAGNOSIS — I25119 Atherosclerotic heart disease of native coronary artery with unspecified angina pectoris: Secondary | ICD-10-CM

## 2015-05-25 DIAGNOSIS — M25512 Pain in left shoulder: Secondary | ICD-10-CM | POA: Diagnosis not present

## 2015-05-25 DIAGNOSIS — I10 Essential (primary) hypertension: Secondary | ICD-10-CM | POA: Diagnosis not present

## 2015-05-25 MED ORDER — PRAVASTATIN SODIUM 40 MG PO TABS: 40.0000 mg | ORAL_TABLET | Freq: Every day | ORAL | Status: DC

## 2015-05-25 MED ORDER — LISINOPRIL 5 MG PO TABS: 5.0000 mg | ORAL_TABLET | Freq: Every day | ORAL | Status: DC

## 2015-05-25 MED ORDER — METOPROLOL TARTRATE 25 MG PO TABS: 25.0000 mg | ORAL_TABLET | Freq: Two times a day (BID) | ORAL | Status: DC

## 2015-05-25 NOTE — Progress Notes (Signed)
BP 125/87 mmHg  Pulse 84  Temp(Src) 97.6 F (36.4 C)  Ht 5\' 7"  (1.702 m)  Wt 278 lb 9.6 oz (126.372 kg)  BMI 43.62 kg/m2  SpO2 96%   Subjective:    Patient ID: Ricky Lee, male    DOB: 1933-06-14, 82 y.o.   MRN: [id number]  HPI: Ricky Lee is a 82 y.o. male  Chief Complaint  Patient presents with  . Follow-up  . Hypertension    Don't regularly. Feels good, but sometimes will get dizzy.   Marland Kitchen Shoulder Pain    From Osteoarthritis. Left.    Left shoulder has been bothering him for about a year; Dr. Netta Cedars; he has had the reverse joint replacement on the right; did pretty well; lost some ROM on the right; cannot reach behind his back; just living with the pain  He does not check BP at home very often; he gets a little dizzy when he stands up after sitting for a long time; drinks water and black coffee, not enough water per wife  They did his medicare wellness visit "[place] a trailer" and they mailed him some paperwork; I do not believe I have copies of those results He reports that he had the echo of the heart and aorta; I asked if [place] and he says it was an echo  Obesity; his weight ranges 260-270 pounds over the last few years; knee surgery really set him back with his activity level, pain killers threw him We talked about weight; he knows it's the things that he eats; gravies and bacon and sausage; eats half a pound of cheese in a week at least  We reviewed labs  Lab Results  Component Value Date   HGBA1C 5.9* 05/17/2015   We reviewed his lipids; he says he'd rather lay off of cheese and bacon; he wants no more medicine (when suggesting statin or other medicine)  He gets some sun in the summer when we discussed vit D  Relevant past medical, surgical, family and social history reviewed and updated as indicated. Interim medical history since our last visit reviewed. Allergies and medications reviewed and updated.  Review of Systems  Per HPI unless  specifically indicated above     Objective:    BP 125/87 mmHg  Pulse 84  Temp(Src) 97.6 F (36.4 C)  Ht 5\' 7"  (1.702 m)  Wt 278 lb 9.6 oz (126.372 kg)  BMI 43.62 kg/m2  SpO2 96%  Wt Readings from Last 3 Encounters:  05/25/15 278 lb 9.6 oz (126.372 kg)  02/16/15 271 lb (122.925 kg)  04/29/14 277 lb (125.646 kg)    Physical Exam  Constitutional: He appears well-developed and well-nourished.  Morbidly obese; weight gain of 7+ pounds since last visit  Eyes: EOM are normal. No scleral icterus.  Neck: No JVD present.  Cardiovascular: Normal rate and regular rhythm.   Pulmonary/Chest: Effort normal and breath sounds normal.  Abdominal: He exhibits no distension.  Musculoskeletal:       Right shoulder: He exhibits decreased range of motion. He exhibits no tenderness.  Right-handed; right pinky will not flex  Neurological: He is alert.  Skin: Skin is warm and dry.  Psychiatric: He has a normal mood and affect. His behavior is normal. Judgment and thought content normal.    Results for orders placed or performed in visit on 05/17/15  CBC with Differential/Platelet  Result Value Ref Range   WBC 6.5 3.4 - 10.8 x10E3/uL   RBC 4.46 4.14 -  5.80 x10E6/uL   Hemoglobin 14.0 12.6 - 17.7 g/dL   Hematocrit 41.7 37.5 - 51.0 %   MCV 94 79 - 97 fL   MCH 31.4 26.6 - 33.0 pg   MCHC 33.6 31.5 - 35.7 g/dL   RDW 13.6 12.3 - 15.4 %   Platelets 194 150 - 379 x10E3/uL   Neutrophils 60 %   Lymphs 26 %   Monocytes 12 %   Eos 2 %   Basos 0 %   Neutrophils Absolute 3.9 1.4 - 7.0 x10E3/uL   Lymphocytes Absolute 1.7 0.7 - 3.1 x10E3/uL   Monocytes Absolute 0.8 0.1 - 0.9 x10E3/uL   EOS (ABSOLUTE) 0.1 0.0 - 0.4 x10E3/uL   Basophils Absolute 0.0 0.0 - 0.2 x10E3/uL   Immature Granulocytes 0 %   Immature Grans (Abs) 0.0 0.0 - 0.1 x10E3/uL  Hgb A1c w/o eAG  Result Value Ref Range   Hgb A1c MFr Bld 5.9 (H) 4.8 - 5.6 %  Lipid Panel w/o Chol/HDL Ratio  Result Value Ref Range   Cholesterol, Total 211  (H) 100 - 199 mg/dL   Triglycerides 124 0 - 149 mg/dL   HDL 69 >39 mg/dL   VLDL Cholesterol Cal 25 5 - 40 mg/dL   LDL Calculated 117 (H) 0 - 99 mg/dL  Comprehensive metabolic panel  Result Value Ref Range   Glucose 107 (H) 65 - 99 mg/dL   BUN 18 8 - 27 mg/dL   Creatinine, Ser 1.14 0.76 - 1.27 mg/dL   GFR calc non Af Amer 60 >59 mL/min/1.73   GFR calc Af Amer 70 >59 mL/min/1.73   BUN/Creatinine Ratio 16 10 - 22   Sodium 142 134 - 144 mmol/L   Potassium 4.8 3.5 - 5.2 mmol/L   Chloride 100 96 - 106 mmol/L   CO2 24 18 - 29 mmol/L   Calcium 9.5 8.6 - 10.2 mg/dL   Total Protein 6.7 6.0 - 8.5 g/dL   Albumin 4.2 3.5 - 4.7 g/dL   Globulin, Total 2.5 1.5 - 4.5 g/dL   Albumin/Globulin Ratio 1.7 1.2 - 2.2   Bilirubin Total 0.8 0.0 - 1.2 mg/dL   Alkaline Phosphatase 65 39 - 117 IU/L   AST 26 0 - 40 IU/L   ALT 16 0 - 44 IU/L  VITAMIN D 25 Hydroxy (Vit-D Deficiency, Fractures)  Result Value Ref Range   Vit D, 25-Hydroxy 32.1 30.0 - 100.0 ng/mL      Assessment & Plan:   Problem List Items Addressed This Visit      Cardiovascular and Mediastinum   Essential hypertension, benign - Primary    Controlled today; continue ACE-I and beta-blocker      Relevant Medications   pravastatin (PRAVACHOL) 40 MG tablet   metoprolol tartrate (LOPRESSOR) 25 MG tablet   lisinopril (PRINIVIL,ZESTRIL) 5 MG tablet   Coronary artery disease    Patient does not want a higher dose of statin or stronger statin; continue beta-blocker; he is aware of his obesity and the need to lose weight; he does not sound motivated or interested in change      Relevant Medications   pravastatin (PRAVACHOL) 40 MG tablet   metoprolol tartrate (LOPRESSOR) 25 MG tablet   lisinopril (PRINIVIL,ZESTRIL) 5 MG tablet     Endocrine   IFG (impaired fasting glucose)    Discussed A1c; he is aware of his obesity and that losing weight would help his health        Other   Morbid obesity ([place])  Patient does not sound motivated  to lose weight      Hyperlipidemia    Patient on statin; does not want a higher dose; does not want to add medicine or try anything stronger      Relevant Medications   pravastatin (PRAVACHOL) 40 MG tablet   metoprolol tartrate (LOPRESSOR) 25 MG tablet   lisinopril (PRINIVIL,ZESTRIL) 5 MG tablet   Vitamin D deficiency    Level now over 30; continue some sun exposure, 1000 iu daily in all sources to keep level maintained      Shoulder pain, left    With decreased ROM; patient will contact his orthopaedist; I suggested topicals, tylenol, turmeric          Follow up plan: Return in about 6 months (around 11/25/2015) for medicare wellness visit with Dr. Sanda Klein.  Meds ordered this encounter  Medications  . pravastatin (PRAVACHOL) 40 MG tablet    Sig: Take 1 tablet (40 mg total) by mouth at bedtime.    Dispense:  90 tablet    Refill:  3  . metoprolol tartrate (LOPRESSOR) 25 MG tablet    Sig: Take 1 tablet (25 mg total) by mouth 2 (two) times daily.    Dispense:  180 tablet    Refill:  3  . lisinopril (PRINIVIL,ZESTRIL) 5 MG tablet    Sig: Take 1 tablet (5 mg total) by mouth daily.    Dispense:  90 tablet    Refill:  3   An after-visit summary was printed and given to the patient at [facility].  Please see the patient instructions which may contain other information and recommendations beyond what is mentioned above in the assessment and plan.

## 2015-05-25 NOTE — Patient Instructions (Addendum)
Try to get 64 ounces of water throughout the day I'll suggest the four Ts: Topicals Tylenol (per package directions) Temperature (heat or ice) Try turmeric as a natural anti-inflammatory (for pain and arthritis). It comes in capsules where you buy aspirin and fish oil, but also as a spice where you buy pepper and garlic powder. Really really try to limit your salt I encourage you to limit portions and eat healthier Do call Dr. [name] if you need to see him for your shoulder Please do send me copies of that screening report, and I'll be glad to go over that with you on the phone; call me if a week goes by and you've not heard from me

## 2015-06-06 DIAGNOSIS — M25512 Pain in left shoulder: Secondary | ICD-10-CM | POA: Insufficient documentation

## 2015-06-06 NOTE — Assessment & Plan Note (Signed)
Level now over 30; continue some sun exposure, 1000 iu daily in all sources to keep level maintained

## 2015-06-06 NOTE — Assessment & Plan Note (Signed)
Patient does not sound motivated to lose weight

## 2015-06-06 NOTE — Assessment & Plan Note (Signed)
Discussed A1c; he is aware of his obesity and that losing weight would help his health

## 2015-06-06 NOTE — Assessment & Plan Note (Addendum)
Patient does not want a higher dose of statin or stronger statin; continue beta-blocker; he is aware of his obesity and the need to lose weight; he does not sound motivated or interested in change

## 2015-06-06 NOTE — Assessment & Plan Note (Signed)
Patient on statin; does not want a higher dose; does not want to add medicine or try anything stronger

## 2015-06-06 NOTE — Assessment & Plan Note (Addendum)
With decreased ROM; patient will contact his orthopaedist; I suggested topicals, tylenol, turmeric

## 2015-06-06 NOTE — Assessment & Plan Note (Signed)
Controlled today; continue ACE-I and beta-blocker

## 2015-07-13 ENCOUNTER — Telehealth: Payer: Self-pay | Admitting: Family Medicine

## 2015-07-13 NOTE — Telephone Encounter (Signed)
FYI: Patient is trying to get in to see you because he fell over the weekend and has a bad cut on his leg. Also his feet are swelling. You do not have anything available until next Thursday. I suggested to the wife to take the patient to ER or urgent care but she said he probably would not go. I then told her that I would place him on the cancellation list for today

## 2015-07-13 NOTE — Telephone Encounter (Signed)
Yes, please tell them I'm very sorry we are so booked up, I do urge him to go to urgent care or the ER

## 2015-07-13 NOTE — Telephone Encounter (Signed)
I called the patient back and informed them of your response. She said he does not want to go to ER or urgent care. You did have an opening for this Friday therefore I placed him there. I also told his wife that I would keep his name on the cancellation list and she said okay thank you.

## 2015-07-16 ENCOUNTER — Encounter: Payer: Self-pay | Admitting: Family Medicine

## 2015-07-16 ENCOUNTER — Ambulatory Visit (INDEPENDENT_AMBULATORY_CARE_PROVIDER_SITE_OTHER): Payer: PPO | Admitting: Family Medicine

## 2015-07-16 VITALS — BP 118/82 | HR 102 | Temp 98.0°F | Resp 16 | Wt 282.0 lb

## 2015-07-16 DIAGNOSIS — T24202A Burn of second degree of unspecified site of left lower limb, except ankle and foot, initial encounter: Secondary | ICD-10-CM

## 2015-07-16 DIAGNOSIS — L03116 Cellulitis of left lower limb: Secondary | ICD-10-CM

## 2015-07-16 DIAGNOSIS — I252 Old myocardial infarction: Secondary | ICD-10-CM | POA: Diagnosis not present

## 2015-07-16 DIAGNOSIS — R0600 Dyspnea, unspecified: Secondary | ICD-10-CM

## 2015-07-16 DIAGNOSIS — R600 Localized edema: Secondary | ICD-10-CM | POA: Diagnosis not present

## 2015-07-16 DIAGNOSIS — I25119 Atherosclerotic heart disease of native coronary artery with unspecified angina pectoris: Secondary | ICD-10-CM | POA: Diagnosis not present

## 2015-07-16 DIAGNOSIS — I10 Essential (primary) hypertension: Secondary | ICD-10-CM

## 2015-07-16 DIAGNOSIS — R0609 Other forms of dyspnea: Secondary | ICD-10-CM | POA: Diagnosis not present

## 2015-07-16 MED ORDER — CLINDAMYCIN HCL 300 MG PO CAPS: 300.0000 mg | ORAL_CAPSULE | Freq: Three times a day (TID) | ORAL | Status: AC

## 2015-07-16 MED ORDER — SILVER SULFADIAZINE 1 % EX CREA: 1.0000 "application " | TOPICAL_CREAM | Freq: Two times a day (BID) | CUTANEOUS | Status: AC

## 2015-07-16 NOTE — Progress Notes (Signed)
BP 118/82 mmHg  Pulse 102  Temp(Src) 98 F (36.7 C) (Oral)  Resp 16  Wt 282 lb (127.914 kg)  SpO2 95%   Subjective:    Patient ID: Ricky Lee, male    DOB: [birth year], 82 y.o.   MRN: [id number]  HPI: Ricky Lee is a 82 y.o. male  Chief Complaint  Patient presents with  . Edema  . Leg Injury    fell at home while mowing and hit leg on muffler and burnt left side.  onset 7 days ago   Swelling in the legs, left more than right after burning the left leg while mowing and hit the muffler with his leg; burned the skin and left a large mark; has been trying to keep it clean; did not go to urgent care  If doing any exercise at all, he has to sit down; breathes hard; going on in the last month wife says; he has a cardiologist in [place], but hasn't seen him in the last year or so Dr. Jenne Campus [facility] [address], [place] [address]   Depression screen [facility] 2/9 07/16/2015  Decreased Interest 0  Down, Depressed, Hopeless 0  PHQ - 2 Score 0   Relevant past medical, surgical, family and social history reviewed Past Medical History  Diagnosis Date  . Hypertension   . Hyperlipemia   . Coronary artery disease   . Arthritis   . OA (osteoarthritis)     ankle/foot/knee/shoulder  . Trigger little finger of left hand   . Degenerative lumbar disc   . Myocardial infarction ([place]) 1984  . Cancer ([place])     oral cancer in past, skin cancer  . Sleep apnea   . Vitamin D deficiency 05/05/2015  . Osteoporosis 05/05/2015   Past Surgical History  Procedure Laterality Date  . Rotator cuff repair      right  . Tongue surgery      removal of cancer  . Leg surgery      right and leg  . Biceps tendon repair      left  . Cholecystectomy    . Cardiac catheterization    . Right foot surgery    . Tonsillectomy    . Coronary angioplasty  1984  . Total shoulder arthroplasty  01/19/2012    Procedure: TOTAL SHOULDER ARTHROPLASTY;  Surgeon:  Augustin Schooling, MD;  Location: [facility];  Service: Orthopedics;  Laterality: Right;  REVERSE TOTAL SHOULDER  . Irrigation and debridement shoulder  02/09/2012    Procedure: IRRIGATION AND DEBRIDEMENT SHOULDER;  Surgeon: Augustin Schooling, MD;  Location: [facility];  Service: Orthopedics;  Laterality: Right;  Right Shoulder I&D with Poly Exchange, Removal of 42 +9 Standard Poly spacer  . Joint replacement  1999    right knee  . Joint replacement  2014    right shoulder  . Eye surgery Bilateral     cataracts  . Total knee arthroplasty Left 04/17/2014    Procedure: LEFT TOTAL KNEE ARTHROPLASTY;  Surgeon: Augustin Schooling, MD;  Location: [facility];  Service: Orthopedics;  Laterality: Left;   Family History  Problem Relation Age of Onset  . Alzheimer's disease Father   . Hypertension Brother   . Heart disease Brother   . Cancer Maternal Grandmother     male  . Cancer Paternal Grandmother   . COPD Neg Hx   . Diabetes Neg Hx   . Stroke Neg Hx    Social History  Substance Use Topics  . Smoking status: Former Smoker -- 2.00 packs/day for 29 years    Types: Cigarettes    Quit date: 04/07/1982  . Smokeless tobacco: Never Used  . Alcohol Use: Yes     Comment: occasional   Interim medical history since last visit reviewed. Allergies and medications reviewed  Review of Systems Per HPI unless specifically indicated above     Objective:    BP 118/82 mmHg  Pulse 102  Temp(Src) 98 F (36.7 C) (Oral)  Resp 16  Wt 282 lb (127.914 kg)  SpO2 95%  Wt Readings from Last 3 Encounters:  07/16/15 282 lb (127.914 kg)  05/25/15 278 lb 9.6 oz (126.372 kg)  02/16/15 271 lb (122.925 kg)    Physical Exam  Constitutional: He appears well-developed and well-nourished.  Cardiovascular: Normal rate and regular rhythm.   Pulmonary/Chest: Effort normal and breath sounds normal. He has no rales.  Abdominal:  Obese  Musculoskeletal: He exhibits edema (1+ edema both legs, with erythema dependently below the  burn on left leg; unable to elicit a Homan's sign on the left). He exhibits no tenderness.  Neurological: He is alert.  Skin: Skin is warm and dry. There is erythema (dependently below burn on left anterior leg).  Psychiatric: He has a normal mood and affect.  Burn on left leg: Oval LEFT 9 x 2.5 cm, shallow, no active drainage, clear margins     Assessment & Plan:   Problem List Items Addressed This Visit      Cardiovascular and Mediastinum   Coronary artery disease    With shortness of breath over the last month; will have him get back in to see his cardiologist in [place] (his preference, he says he will call)      Relevant Medications   furosemide (LASIX) 20 MG tablet   Essential hypertension, benign    controlled      Relevant Medications   furosemide (LASIX) 20 MG tablet     Other   Bilateral leg edema    Low dose furosemide for 3 days; patient to see his cardiologist, Dr. Jenne Campus, in [place]; may need echo      Cellulitis of leg, left - Primary    Start antibiotics; check CBC; use silvadene topically; reasons to go to ER/urgent care over weekend reviewed; discussed risk of C diff, see AVS      Relevant Orders   CBC with Differential/Platelet (Completed)   History of heart attack    Now with DOE; will refer to cardiologist      Morbid obesity [facility])    Patient has not seemed motivated to make any lifestyle changes       Other Visit Diagnoses    Burn of leg, left, second degree, initial encounter        silvadene, keep covered; offered f/u but patient says he will call only if needed    Dyspnea on exertion        mild lower extremity edema today; refer to cardiologist for echo, evaluation       Follow up plan: Return if symptoms worsen or fail to improve. (I offered f/u appt, but patient politely declined and says he'll call if needed)  An after-visit summary was printed and given to the patient at [facility].  Please see the patient instructions  which may contain other information and recommendations beyond what is mentioned above in the assessment and plan.  Meds ordered this encounter  Medications  . silver sulfADIAZINE (  SILVADENE) 1 % cream    Sig: Apply 1 application topically 2 (two) times daily. As needed    Dispense:  50 g    Refill:  0  . clindamycin (CLEOCIN) 300 MG capsule    Sig: Take 1 capsule (300 mg total) by mouth 3 (three) times daily.    Dispense:  21 capsule    Refill:  0  . furosemide (LASIX) 20 MG tablet    Sig: Take 1 tablet (20 mg total) by mouth daily. For just three days, may repeat if needed    Dispense:  6 tablet    Refill:  0    Orders Placed This Encounter  Procedures  . CBC with Differential/Platelet

## 2015-07-16 NOTE — Patient Instructions (Signed)
Use the silvadene for the topical burn Elevate the left leg up above the level of your hip and try to do some calf exercises If you develop fever or feel badly or confused or worse, go to the ER Please do eat yogurt daily or take a probiotic daily for the next month or two We want to replace the healthy germs in the gut If you notice foul, watery diarrhea in the next two months, schedule an appointment RIGHT AWAY

## 2015-07-17 LAB — CBC WITH DIFFERENTIAL/PLATELET
BASOS ABS: 0 10*3/uL (ref 0.0–0.2)
BASOS: 0 %
EOS (ABSOLUTE): 0.2 10*3/uL (ref 0.0–0.4)
Eos: 2 %
Hematocrit: 43.3 % (ref 37.5–51.0)
Hemoglobin: 14.3 g/dL (ref 12.6–17.7)
IMMATURE GRANULOCYTES: 0 %
Immature Grans (Abs): 0 10*3/uL (ref 0.0–0.1)
LYMPHS: 28 %
Lymphocytes Absolute: 2.3 10*3/uL (ref 0.7–3.1)
MCH: 31.2 pg (ref 26.6–33.0)
MCHC: 33 g/dL (ref 31.5–35.7)
MCV: 94 fL (ref 79–97)
Monocytes Absolute: 1 10*3/uL — ABNORMAL HIGH (ref 0.1–0.9)
Monocytes: 12 %
NEUTROS PCT: 58 %
Neutrophils Absolute: 4.8 10*3/uL (ref 1.4–7.0)
PLATELETS: 218 10*3/uL (ref 150–379)
RBC: 4.59 x10E6/uL (ref 4.14–5.80)
RDW: 13.3 % (ref 12.3–15.4)
WBC: 8.4 10*3/uL (ref 3.4–10.8)

## 2015-07-18 ENCOUNTER — Telehealth: Payer: Self-pay | Admitting: Family Medicine

## 2015-07-18 DIAGNOSIS — R600 Localized edema: Secondary | ICD-10-CM | POA: Insufficient documentation

## 2015-07-18 MED ORDER — FUROSEMIDE 20 MG PO TABS: 20.0000 mg | ORAL_TABLET | Freq: Every day | ORAL | Status: DC

## 2015-07-18 NOTE — Assessment & Plan Note (Addendum)
Now with DOE; will refer him back to his cardiologist

## 2015-07-18 NOTE — Assessment & Plan Note (Signed)
Patient has not seemed motivated to make any lifestyle changes

## 2015-07-18 NOTE — Assessment & Plan Note (Signed)
controlled 

## 2015-07-18 NOTE — Assessment & Plan Note (Signed)
Start antibiotics; check CBC; use silvadene topically; reasons to go to ER/urgent care over weekend reviewed; discussed risk of C diff, see AVS

## 2015-07-18 NOTE — Assessment & Plan Note (Addendum)
With shortness of breath over the last month; will have him get back in to see his cardiologist in [place] (his preference, he says he will call)

## 2015-07-18 NOTE — Telephone Encounter (Signed)
Please send copy of my office note to Dr. Jenne Campus, cardiologist in [place]; thank you

## 2015-07-18 NOTE — Assessment & Plan Note (Addendum)
Low dose furosemide for 3 days; patient to see his cardiologist, Dr. Jenne Campus, in [place]; may need echo

## 2015-07-27 NOTE — Telephone Encounter (Signed)
Please see below - thank you!

## 2015-07-28 NOTE — Telephone Encounter (Signed)
Already faxed.

## 2015-10-26 ENCOUNTER — Telehealth: Payer: Self-pay | Admitting: Family Medicine

## 2015-10-26 DIAGNOSIS — I252 Old myocardial infarction: Secondary | ICD-10-CM

## 2015-10-26 DIAGNOSIS — Z01810 Encounter for preprocedural cardiovascular examination: Secondary | ICD-10-CM

## 2015-10-26 NOTE — Telephone Encounter (Signed)
We have another doctor in his care team Please verify which cardiologist he wants to see Thank you

## 2015-10-27 DIAGNOSIS — Z01810 Encounter for preprocedural cardiovascular examination: Secondary | ICD-10-CM | POA: Insufficient documentation

## 2015-10-27 NOTE — Telephone Encounter (Signed)
Thank you for clarifying; his previous cardiologist had a name very similar to Dr. Nehemiah Massed and I wanted to make sure we sent him to his preferred doctor

## 2015-10-27 NOTE — Assessment & Plan Note (Signed)
Refer to cardiologist. °

## 2015-10-27 NOTE — Assessment & Plan Note (Signed)
Refer to cardiologist for pre-op clearance

## 2015-11-15 ENCOUNTER — Ambulatory Visit (INDEPENDENT_AMBULATORY_CARE_PROVIDER_SITE_OTHER): Payer: PPO | Admitting: Family Medicine

## 2015-11-15 ENCOUNTER — Encounter: Payer: Self-pay | Admitting: Family Medicine

## 2015-11-15 VITALS — BP 124/82 | HR 93 | Temp 98.3°F | Resp 14 | Wt 285.0 lb

## 2015-11-15 DIAGNOSIS — Z01818 Encounter for other preprocedural examination: Secondary | ICD-10-CM

## 2015-11-15 DIAGNOSIS — R7301 Impaired fasting glucose: Secondary | ICD-10-CM | POA: Diagnosis not present

## 2015-11-15 DIAGNOSIS — I25119 Atherosclerotic heart disease of native coronary artery with unspecified angina pectoris: Secondary | ICD-10-CM | POA: Diagnosis not present

## 2015-11-15 DIAGNOSIS — Z5181 Encounter for therapeutic drug level monitoring: Secondary | ICD-10-CM | POA: Diagnosis not present

## 2015-11-15 DIAGNOSIS — Z23 Encounter for immunization: Secondary | ICD-10-CM | POA: Diagnosis not present

## 2015-11-15 DIAGNOSIS — M25512 Pain in left shoulder: Secondary | ICD-10-CM | POA: Diagnosis not present

## 2015-11-15 DIAGNOSIS — R600 Localized edema: Secondary | ICD-10-CM

## 2015-11-15 DIAGNOSIS — E785 Hyperlipidemia, unspecified: Secondary | ICD-10-CM

## 2015-11-15 LAB — CBC WITH DIFFERENTIAL/PLATELET
BASOS PCT: 0 %
Basophils Absolute: 0 cells/uL (ref 0–200)
EOS PCT: 2 %
Eosinophils Absolute: 150 cells/uL (ref 15–500)
HCT: 44.9 % (ref 38.5–50.0)
Hemoglobin: 15 g/dL (ref 13.2–17.1)
LYMPHS PCT: 29 %
Lymphs Abs: 2175 cells/uL (ref 850–3900)
MCH: 31.9 pg (ref 27.0–33.0)
MCHC: 33.4 g/dL (ref 32.0–36.0)
MCV: 95.5 fL (ref 80.0–100.0)
MONO ABS: 600 {cells}/uL (ref 200–950)
MONOS PCT: 8 %
MPV: 9.6 fL (ref 7.5–12.5)
NEUTROS PCT: 61 %
Neutro Abs: 4575 cells/uL (ref 1500–7800)
PLATELETS: 186 10*3/uL (ref 140–400)
RBC: 4.7 MIL/uL (ref 4.20–5.80)
RDW: 13.3 % (ref 11.0–15.0)
WBC: 7.5 10*3/uL (ref 3.8–10.8)

## 2015-11-15 LAB — TSH: TSH: 3.37 m[IU]/L (ref 0.40–4.50)

## 2015-11-15 NOTE — Assessment & Plan Note (Signed)
Check lipids; goal LDL likely under 70 ideally, but at least under 100

## 2015-11-15 NOTE — Assessment & Plan Note (Signed)
Managed by Dr. Nehemiah Massed; stress test on Oct 3rd

## 2015-11-15 NOTE — Assessment & Plan Note (Signed)
Well-controlled, work on weight loss; glucose control important with healing post-op

## 2015-11-15 NOTE — Assessment & Plan Note (Signed)
Check labs 

## 2015-11-15 NOTE — Patient Instructions (Signed)
Check out the information at familydoctor.org entitled "Nutrition for Weight Loss: What You Need to Know about Fad Diets" Try to lose between 1-2 pounds per week by taking in fewer calories and burning off more calories You can succeed by limiting portions, limiting foods dense in calories and fat, becoming more active, and drinking 8 glasses of water a day (64 ounces) Don't skip meals, especially breakfast, as skipping meals may alter your metabolism Do not use over-the-counter weight loss pills or gimmicks that claim rapid weight loss A healthy BMI (or body mass index) is between 18.5 and 24.9 You can calculate your ideal BMI at the NIH website http://www.nhlbi.nih.gov/health/educational/lose_wt/BMI/bmicalc.htm  

## 2015-11-15 NOTE — Assessment & Plan Note (Signed)
Don't skip meals, encouraged hydration, breakfast, limit empty calories; move more

## 2015-11-15 NOTE — Progress Notes (Signed)
BP 124/82   Pulse 93   Temp 98.3 F (36.8 C) (Oral)   Resp 14   Wt 285 lb (129.3 kg)   SpO2 95%   BMI 44.64 kg/m    Subjective:    Patient ID: Ricky Lee, male    DOB: 01/27/1934, 82 y.o.   MRN: [id number]  HPI: Ricky Lee is a 82 y.o. male  Chief Complaint  Patient presents with  . surgical clearance   Patient is here for clearance prior to left shoulder surgery coming up; date is not chosen yet Patient says his shoulder joint is "bone on bone", right shoulder already done Cardiac clearance is coming from Dr. Nehemiah Massed No current boils or abscesses; no hx of MRSA No recent fevers He is having some shortness of breath with exertion; can go up just one flight of stairs; okay walking on flat ground to parking lot, e.g. No chest pain No sleep apnea Not an easy bleeder; on aspirin 325 mg and just flat bruises with trauma No seizures, no chronic headaches He does not remember being in the hospital after the last surgery They don't think he had hallucinations; Dr. [name] knows about this No problems with the bowels No dysuria, no blood in the urine Flu shot today  Reviewed previous labs; used to be on vit D prescription years ago; gets out in the sun now; last level was normal A1c of 5.9 in March; prediabetes LDL 117, but HDL 69  Obesity; sometimes skips breakfast; big meal at lunch; not eating a lot; no known thyroid dz in the family  Depression screen [facility] 2/9 11/15/2015 07/16/2015  Decreased Interest 0 0  Down, Depressed, Hopeless 0 0  PHQ - 2 Score 0 0   Relevant past medical, surgical, family and social history reviewed Past Medical History:  Diagnosis Date  . Arthritis   . Cancer ([place])    oral cancer in past, skin cancer  . Coronary artery disease   . Degenerative lumbar disc   . Hyperlipemia   . Hypertension   . Myocardial infarction ([place]) 1984  . OA (osteoarthritis)    ankle/foot/knee/shoulder  . Osteoporosis 05/05/2015  . Sleep apnea   .  Trigger little finger of left hand   . Vitamin D deficiency 05/05/2015   Past Surgical History:  Procedure Laterality Date  . BICEPS TENDON REPAIR     left  . CARDIAC CATHETERIZATION    . CHOLECYSTECTOMY    . CORONARY ANGIOPLASTY  1984  . EYE SURGERY Bilateral    cataracts  . IRRIGATION AND DEBRIDEMENT SHOULDER  02/09/2012   Procedure: IRRIGATION AND DEBRIDEMENT SHOULDER;  Surgeon: Augustin Schooling, MD;  Location: [facility] [place];  Service: Orthopedics;  Laterality: Right;  Right Shoulder I&D with Poly Exchange, Removal of 42 +9 Standard Poly spacer  . JOINT REPLACEMENT  1999   right knee  . JOINT REPLACEMENT  2014   right shoulder  . LEG SURGERY     right and leg  . right foot surgery    . ROTATOR CUFF REPAIR     right  . TONGUE SURGERY     removal of cancer  . TONSILLECTOMY    . TOTAL KNEE ARTHROPLASTY Left 04/17/2014   Procedure: LEFT TOTAL KNEE ARTHROPLASTY;  Surgeon: Augustin Schooling, MD;  Location: [place];  Service: Orthopedics;  Laterality: Left;  . TOTAL SHOULDER ARTHROPLASTY  01/19/2012   Procedure: TOTAL SHOULDER ARTHROPLASTY;  Surgeon: Augustin Schooling, MD;  Location: [place];  Service: Orthopedics;  Laterality: Right;  REVERSE TOTAL SHOULDER   Family History  Problem Relation Age of Onset  . Alzheimer's disease Father   . Hypertension Brother   . Heart disease Brother   . Cancer Maternal Grandmother     male  . Cancer Paternal Grandmother   . COPD Neg Hx   . Diabetes Neg Hx   . Stroke Neg Hx    Social History  Substance Use Topics  . Smoking status: Former Smoker    Packs/day: 2.00    Years: 29.00    Types: Cigarettes    Quit date: 04/07/1982  . Smokeless tobacco: Never Used  . Alcohol use Yes     Comment: occasional   Interim medical history since last visit reviewed. Allergies and medications reviewed  Review of Systems  Constitutional: Positive for unexpected weight change (weight gain).  Cardiovascular: Positive for leg swelling (cardiologist is aware).   Per  HPI unless specifically indicated above     Objective:    BP 124/82   Pulse 93   Temp 98.3 F (36.8 C) (Oral)   Resp 14   Wt 285 lb (129.3 kg)   SpO2 95%   BMI 44.64 kg/m   Wt Readings from Last 3 Encounters:  11/15/15 285 lb (129.3 kg)  07/16/15 282 lb (127.9 kg)  05/25/15 278 lb 9.6 oz (126.4 kg)    Physical Exam  Constitutional: He appears well-developed and well-nourished.  Morbidly obese  Eyes: EOM are normal. No scleral icterus.  Neck: No JVD present.  Cardiovascular: Normal rate and regular rhythm.   Pulmonary/Chest: Effort normal and breath sounds normal.  Abdominal: He exhibits no distension.  Musculoskeletal: He exhibits edema (1+ pitting edema of both lower legs; no erythema).       Left shoulder: He exhibits decreased range of motion. He exhibits no swelling and no deformity.  Neurological: He is alert.  Skin: Skin is warm and dry.  Psychiatric: He has a normal mood and affect. His behavior is normal. Judgment and thought content normal.    Results for orders placed or performed in visit on 11/15/15  TSH  Result Value Ref Range   TSH 3.37 0.40 - 4.50 mIU/L  CBC with Differential/Platelet  Result Value Ref Range   WBC 7.5 3.8 - 10.8 K/uL   RBC 4.70 4.20 - 5.80 MIL/uL   Hemoglobin 15.0 13.2 - 17.1 g/dL   HCT 44.9 38.5 - 50.0 %   MCV 95.5 80.0 - 100.0 fL   MCH 31.9 27.0 - 33.0 pg   MCHC 33.4 32.0 - 36.0 g/dL   RDW 13.3 11.0 - 15.0 %   Platelets 186 140 - 400 K/uL   MPV 9.6 7.5 - 12.5 fL   Neutro Abs 4,575 1,500 - 7,800 cells/uL   Lymphs Abs 2,175 850 - 3,900 cells/uL   Monocytes Absolute 600 200 - 950 cells/uL   Eosinophils Absolute 150 15 - 500 cells/uL   Basophils Absolute 0 0 - 200 cells/uL   Neutrophils Relative % 61 %   Lymphocytes Relative 29 %   Monocytes Relative 8 %   Eosinophils Relative 2 %   Basophils Relative 0 %   Smear Review Criteria for review not met   COMPLETE METABOLIC PANEL WITH GFR  Result Value Ref Range   Sodium 141 135 -  146 mmol/L   Potassium 5.3 3.5 - 5.3 mmol/L   Chloride 102 98 - 110 mmol/L   CO2 28 20 - 31 mmol/L  Glucose, Bld 94 65 - 99 mg/dL   BUN 20 7 - 25 mg/dL   Creat 1.06 0.70 - 1.11 mg/dL   Total Bilirubin 0.8 0.2 - 1.2 mg/dL   Alkaline Phosphatase 51 40 - 115 U/L   AST 25 10 - 35 U/L   ALT 17 9 - 46 U/L   Total Protein 6.6 6.1 - 8.1 g/dL   Albumin 4.0 3.6 - 5.1 g/dL   Calcium 9.7 8.6 - 10.3 mg/dL   GFR, Est African American 76 >=60 mL/min   GFR, Est Non African American 66 >=60 mL/min  Hemoglobin A1c  Result Value Ref Range   Hgb A1c MFr Bld 5.6 <5.7 %   Mean Plasma Glucose 114 mg/dL  Urinalysis w microscopic + reflex cultur  Result Value Ref Range   Color, Urine YELLOW YELLOW   APPearance CLEAR CLEAR   Specific Gravity, Urine 1.020 1.001 - 1.035   pH 5.5 5.0 - 8.0   Glucose, UA NEGATIVE NEGATIVE   Bilirubin Urine NEGATIVE NEGATIVE   Ketones, ur NEGATIVE NEGATIVE   Hgb urine dipstick NEGATIVE NEGATIVE   Protein, ur NEGATIVE NEGATIVE   Nitrite NEGATIVE NEGATIVE   Leukocytes, UA NEGATIVE NEGATIVE   WBC, UA NONE SEEN <=5 WBC/HPF   RBC / HPF NONE SEEN <=2 RBC/HPF   Squamous Epithelial / LPF NONE SEEN <=5 HPF   Bacteria, UA NONE SEEN NONE SEEN HPF   Crystals NONE SEEN NONE SEEN HPF   Casts NONE SEEN NONE SEEN LPF   Yeast NONE SEEN NONE SEEN HPF  Lipid Panel w/Direct LDL:HDL Ratio  Result Value Ref Range   Cholesterol 203 (H) 125 - 200 mg/dL   Triglycerides 184 (H) <150 mg/dL   HDL 61 >=40 mg/dL   Total Chol/HDL Ratio 3.3 <=5.0 Ratio   Direct LDL 124 <130 mg/dL   LDL:HDL Ratio 2.0 Ratio      Assessment & Plan:   Problem List Items Addressed This Visit      Cardiovascular and Mediastinum   Coronary artery disease (Chronic)    Managed by Dr. Nehemiah Massed; stress test on Oct 3rd      Relevant Orders   Lipid Panel w/Direct LDL:HDL Ratio (Completed)     Endocrine   IFG (impaired fasting glucose) (Chronic)    Well-controlled, work on weight loss; glucose control important  with healing post-op      Relevant Orders   Hemoglobin A1c (Completed)     Other   Shoulder pain, left    Plan for surgery soon      Pre-operative clearance - Primary    Will get labs; I think after seeing labs, and after he obtains cardiac clearance, he should be able to proceed with surgery; however, he is at increased risk of complications or issues with anesthesia, with his morbid obesity, advanced age and he is aware of this; I encouraged him to talk with anesthesiologist prior to considering type of anesthesia for surgery, in case regional anesthesia could be considered instead of general      Relevant Orders   Urinalysis w microscopic + reflex cultur (Completed)   Morbid obesity ([place]) (Chronic)    Don't skip meals, encouraged hydration, breakfast, limit empty calories; move more      Relevant Orders   TSH (Completed)   Medication monitoring encounter    Check labs      Relevant Orders   CBC with Differential/Platelet (Completed)   COMPLETE METABOLIC PANEL WITH GFR (Completed)   Hyperlipidemia (Chronic)  Check lipids; goal LDL likely under 70 ideally, but at least under 100      Relevant Orders   Lipid Panel w/Direct LDL:HDL Ratio (Completed)   Bilateral leg edema    Patient says cardiologist is aware; I'm sure cardiologist will schedule an echocardiogram prior to surgery       Other Visit Diagnoses    Needs flu shot       Relevant Orders   Flu vaccine HIGH DOSE PF (Fluzone High dose) (Completed)      Follow up plan: No Follow-up on file.  An after-visit summary was printed and given to the patient at [place].  Please see the patient instructions which may contain other information and recommendations beyond what is mentioned above in the assessment and plan.  No orders of the defined types were placed in this encounter.   Orders Placed This Encounter  Procedures  . Flu vaccine HIGH DOSE PF (Fluzone High dose)  . TSH  . CBC with  Differential/Platelet  . COMPLETE METABOLIC PANEL WITH GFR  . Hemoglobin A1c  . Urinalysis w microscopic + reflex cultur  . Lipid Panel w/Direct LDL:HDL Ratio

## 2015-11-15 NOTE — Assessment & Plan Note (Signed)
Plan for surgery soon

## 2015-11-16 ENCOUNTER — Other Ambulatory Visit: Payer: Self-pay | Admitting: Family Medicine

## 2015-11-16 DIAGNOSIS — I25119 Atherosclerotic heart disease of native coronary artery with unspecified angina pectoris: Secondary | ICD-10-CM

## 2015-11-16 DIAGNOSIS — Z5181 Encounter for therapeutic drug level monitoring: Secondary | ICD-10-CM

## 2015-11-16 LAB — URINALYSIS W MICROSCOPIC + REFLEX CULTURE
BACTERIA UA: NONE SEEN [HPF]
Bilirubin Urine: NEGATIVE
Casts: NONE SEEN [LPF]
Crystals: NONE SEEN [HPF]
Glucose, UA: NEGATIVE
HGB URINE DIPSTICK: NEGATIVE
Ketones, ur: NEGATIVE
LEUKOCYTES UA: NEGATIVE
NITRITE: NEGATIVE
PH: 5.5 (ref 5.0–8.0)
PROTEIN: NEGATIVE
RBC / HPF: NONE SEEN RBC/HPF (ref <=2)
SQUAMOUS EPITHELIAL / LPF: NONE SEEN [HPF] (ref <=5)
Specific Gravity, Urine: 1.02 (ref 1.001–1.035)
WBC UA: NONE SEEN WBC/HPF (ref <=5)
YEAST: NONE SEEN [HPF]

## 2015-11-16 LAB — COMPLETE METABOLIC PANEL WITH GFR
ALT: 17 U/L (ref 9–46)
AST: 25 U/L (ref 10–35)
Albumin: 4 g/dL (ref 3.6–5.1)
Alkaline Phosphatase: 51 U/L (ref 40–115)
BUN: 20 mg/dL (ref 7–25)
CHLORIDE: 102 mmol/L (ref 98–110)
CO2: 28 mmol/L (ref 20–31)
CREATININE: 1.06 mg/dL (ref 0.70–1.11)
Calcium: 9.7 mg/dL (ref 8.6–10.3)
GFR, Est African American: 76 mL/min (ref >=60)
GFR, Est Non African American: 66 mL/min (ref >=60)
Glucose, Bld: 94 mg/dL (ref 65–99)
POTASSIUM: 5.3 mmol/L (ref 3.5–5.3)
SODIUM: 141 mmol/L (ref 135–146)
Total Bilirubin: 0.8 mg/dL (ref 0.2–1.2)
Total Protein: 6.6 g/dL (ref 6.1–8.1)

## 2015-11-16 LAB — LIPID PANEL W/DIRECT LDL/HDL RATIO
CHOLESTEROL - DIR LDL/HDL RATIO: 203 mg/dL — AB (ref 125–200)
HDL: 61 mg/dL (ref >=40)
LDL DIRECT: 124 mg/dL (ref <130)
LDL:HDL Ratio: 2 Ratio
TRIGLYCERIDES: 184 mg/dL — AB (ref <150)
Total Chol/HDL Ratio: 3.3 Ratio (ref <=5.0)

## 2015-11-16 LAB — HEMOGLOBIN A1C
Hgb A1c MFr Bld: 5.6 % (ref <5.7)
Mean Plasma Glucose: 114 mg/dL

## 2015-11-16 MED ORDER — PRAVASTATIN SODIUM 80 MG PO TABS: 80.0000 mg | ORAL_TABLET | Freq: Every day | ORAL | 0 refills | Status: DC

## 2015-11-16 NOTE — Assessment & Plan Note (Signed)
Check sgpt in 6-8 weeks 

## 2015-11-16 NOTE — Progress Notes (Signed)
Increase statin, LDL not to goal; recheck lipids and sgpt in 6-8 weeks, pressed pt to watch diet and lose weight

## 2015-11-16 NOTE — Assessment & Plan Note (Signed)
Increase statin, recheck lipids in 6-8 weeks 

## 2015-11-17 NOTE — Assessment & Plan Note (Signed)
Will get labs; I think after seeing labs, and after he obtains cardiac clearance, he should be able to proceed with surgery; however, he is at increased risk of complications or issues with anesthesia, with his morbid obesity, advanced age and he is aware of this; I encouraged him to talk with anesthesiologist prior to considering type of anesthesia for surgery, in case regional anesthesia could be considered instead of general

## 2015-11-17 NOTE — Assessment & Plan Note (Signed)
Patient says cardiologist is aware; I'm sure cardiologist will schedule an echocardiogram prior to surgery

## 2015-11-30 DIAGNOSIS — I252 Old myocardial infarction: Secondary | ICD-10-CM | POA: Insufficient documentation

## 2016-01-25 NOTE — H&P (Signed)
R[facility] Lee is an 82 y.o. male.    Chief Complaint: left shoulder pain  HPI: Pt is a 82 y.o. male complaining of left shoulder pain for multiple years. Pain had continually increased since the beginning. X-rays in the clinic show end-stage arthritic changes of the left shoulder. Pt has tried various conservative treatments which have failed to alleviate their symptoms, including injections and therapy. Various options are discussed with the patient. Risks, benefits and expectations were discussed with the patient. Patient understand the risks, benefits and expectations and wishes to proceed with surgery.   PCP:  Enid Derry, MD  D/C Plans: Home  PMH: Past Medical History:  Diagnosis Date  . Arthritis   . Cancer ([place])    oral cancer in past, skin cancer  . Coronary artery disease   . Degenerative lumbar disc   . Hyperlipemia   . Hypertension   . Myocardial infarction 1984  . OA (osteoarthritis)    ankle/foot/knee/shoulder  . Osteoporosis 05/05/2015  . Sleep apnea   . Trigger little finger of left hand   . Vitamin D deficiency 05/05/2015    PSH: Past Surgical History:  Procedure Laterality Date  . BICEPS TENDON REPAIR     left  . CARDIAC CATHETERIZATION    . CHOLECYSTECTOMY    . CORONARY ANGIOPLASTY  1984  . EYE SURGERY Bilateral    cataracts  . IRRIGATION AND DEBRIDEMENT SHOULDER  02/09/2012   Procedure: IRRIGATION AND DEBRIDEMENT SHOULDER;  Surgeon: Augustin Schooling, MD;  Location: [place];  Service: Orthopedics;  Laterality: Right;  Right Shoulder I&D with Poly Exchange, Removal of 42 +9 Standard Poly spacer  . JOINT REPLACEMENT  1999   right knee  . JOINT REPLACEMENT  2014   right shoulder  . LEG SURGERY     right and leg  . right foot surgery    . ROTATOR CUFF REPAIR     right  . TONGUE SURGERY     removal of cancer  . TONSILLECTOMY    . TOTAL KNEE ARTHROPLASTY Left 04/17/2014   Procedure: LEFT TOTAL KNEE ARTHROPLASTY;  Surgeon: Augustin Schooling, MD;  Location:  [place];  Service: Orthopedics;  Laterality: Left;  . TOTAL SHOULDER ARTHROPLASTY  01/19/2012   Procedure: TOTAL SHOULDER ARTHROPLASTY;  Surgeon: Augustin Schooling, MD;  Location: [place];  Service: Orthopedics;  Laterality: Right;  REVERSE TOTAL SHOULDER    Social History:  reports that he quit smoking about 33 years ago. His smoking use included Cigarettes. He has a 58.00 pack-year smoking history. He has never used smokeless tobacco. He reports that he drinks alcohol. He reports that he does not use drugs.  Allergies:  Allergies  Allergen Reactions  . Coumadin [Warfarin]   . Oxycodone Other (See Comments)    Memory Loss. Patient is "out of it".     Medications: No current facility-administered medications for this encounter.    Current Outpatient Prescriptions  Medication Sig Dispense Refill  . APPLE CIDER VINEGAR PO Take by mouth daily.    Marland Kitchen aspirin 325 MG tablet Take 325 mg by mouth daily.    . cetirizine (ZYRTEC) 10 MG tablet Take 10 mg by mouth daily as needed for allergies.    . Coenzyme Q10 (CO Q 10 PO) Take 100 mg by mouth daily.    . furosemide (LASIX) 20 MG tablet Take 1 tablet (20 mg total) by mouth daily. For just three days, may repeat if needed 6 tablet 0  . GARLIC PO  Take by mouth daily.    Marland Kitchen lisinopril (PRINIVIL,ZESTRIL) 5 MG tablet Take 1 tablet (5 mg total) by mouth daily. 90 tablet 3  . LUTEIN PO Take by mouth daily.    Marland Kitchen LYCOPENE PO Take by mouth daily.    . metoprolol tartrate (LOPRESSOR) 25 MG tablet Take 1 tablet (25 mg total) by mouth 2 (two) times daily. 180 tablet 3  . Multiple Vitamin (MULTIVITAMIN) tablet Take 1 tablet by mouth daily.    . naproxen sodium (ANAPROX) 220 MG tablet Take 220 mg by mouth daily as needed.    . Omega-3 Fatty Acids (SALMON OIL PO) Take 1 capsule by mouth daily.    . pravastatin (PRAVACHOL) 80 MG tablet Take 1 tablet (80 mg total) by mouth at bedtime. 90 tablet 0  . vitamin E (VITAMIN E) 400 UNIT capsule Take 400 Units by mouth daily.       No results found for this or any previous visit (from the past 48 hour(s)). No results found.  ROS: Pain with rom of the left upper extremity  Physical Exam:  Alert and oriented 82 y.o. male in no acute distress Cranial nerves 2-12 intact Cervical spine: full rom with no tenderness, nv intact distally Chest: active breath sounds bilaterally, no wheeze rhonchi or rales Heart: regular rate and rhythm, no murmur Abd: non tender non distended with active bowel sounds Hip is stable with rom  Left shoulder and upper extremity with limited rom nv intact distally Strength limited with ER and IR  No rashes  Assessment/Plan Assessment: left shoulder rotator cuff arthropathy  Plan: Patient will undergo a left reverse total shoulder by Dr. [name] at [facility]. Risks benefits and expectations were discussed with the patient. Patient understand risks, benefits and expectations and wishes to proceed.

## 2016-02-04 ENCOUNTER — Encounter (HOSPITAL_COMMUNITY)
Admission: RE | Admit: 2016-02-04 | Discharge: 2016-02-04 | Disposition: A | Discharge status: Home / Self Care | Payer: PPO | Source: Ambulatory Visit | Attending: Orthopedic Surgery | Admitting: Orthopedic Surgery

## 2016-02-04 ENCOUNTER — Encounter (HOSPITAL_COMMUNITY): Payer: Self-pay

## 2016-02-04 ENCOUNTER — Telehealth: Payer: Self-pay | Admitting: Family Medicine

## 2016-02-04 ENCOUNTER — Telehealth: Payer: Self-pay

## 2016-02-04 DIAGNOSIS — M12812 Other specific arthropathies, not elsewhere classified, left shoulder: Secondary | ICD-10-CM | POA: Diagnosis not present

## 2016-02-04 DIAGNOSIS — Z01812 Encounter for preprocedural laboratory examination: Secondary | ICD-10-CM | POA: Insufficient documentation

## 2016-02-04 DIAGNOSIS — Z01818 Encounter for other preprocedural examination: Secondary | ICD-10-CM | POA: Diagnosis present

## 2016-02-04 DIAGNOSIS — Z9189 Other specified personal risk factors, not elsewhere classified: Secondary | ICD-10-CM

## 2016-02-04 HISTORY — DX: Adverse effect of unspecified anesthetic, initial encounter: T41.45XA

## 2016-02-04 HISTORY — DX: Other complications of anesthesia, initial encounter: T88.59XA

## 2016-02-04 HISTORY — DX: Dyspnea, unspecified: R06.00

## 2016-02-04 LAB — BASIC METABOLIC PANEL WITH GFR
Anion gap: 5 (ref 5–15)
BUN: 23 mg/dL — ABNORMAL HIGH (ref 6–20)
CO2: 29 mmol/L (ref 22–32)
Calcium: 9.7 mg/dL (ref 8.9–10.3)
Chloride: 106 mmol/L (ref 101–111)
Creatinine, Ser: 1.12 mg/dL (ref 0.61–1.24)
GFR calc Af Amer: >60 mL/min
GFR calc non Af Amer: >60 mL/min
Glucose, Bld: 83 mg/dL (ref 65–99)
Potassium: 4.5 mmol/L (ref 3.5–5.1)
Sodium: 140 mmol/L (ref 135–145)

## 2016-02-04 LAB — CBC
HCT: 44.2 % (ref 39.0–52.0)
Hemoglobin: 14.5 g/dL (ref 13.0–17.0)
MCH: 31.7 pg (ref 26.0–34.0)
MCHC: 32.8 g/dL (ref 30.0–36.0)
MCV: 96.5 fL (ref 78.0–100.0)
Platelets: 177 K/uL (ref 150–400)
RBC: 4.58 MIL/uL (ref 4.22–5.81)
RDW: 13.2 % (ref 11.5–15.5)
WBC: 7.9 K/uL (ref 4.0–10.5)

## 2016-02-04 LAB — SURGICAL PCR SCREEN
MRSA, PCR: NEGATIVE
Staphylococcus aureus: NEGATIVE

## 2016-02-04 NOTE — Progress Notes (Addendum)
Ricky Lee denies cxhest pain or shortness of breath at rest.  Stop Bang assesment  score is 5, [facility] Lee reports that he is going to be tested in January. Patient has a history of MI in 1984and is followed by [facility].  I requested reports.

## 2016-02-04 NOTE — Telephone Encounter (Signed)
Patient had a positive screen for possible obstructive sleep apnea I'm putting in referral to see pulmonologist for evaluation

## 2016-02-04 NOTE — Telephone Encounter (Signed)
Patient wife called states husband had pre op today at hospital and you stated he needed clearance through cardio.  Wife states he has already seen cardio with stress test and they have cleared him? Please advise?

## 2016-02-04 NOTE — Progress Notes (Signed)
   02/04/16 1009  OBSTRUCTIVE SLEEP APNEA  Have you ever been diagnosed with sleep apnea through a sleep study? No  Do you snore loudly (loud enough to be heard through closed doors)?  0  Do you often feel tired, fatigued, or sleepy during the daytime (such as falling asleep during driving or talking to someone)? 0  Has anyone observed you stop breathing during your sleep? 0  Do you have, or are you being treated for high blood pressure? 1  BMI more than 35 kg/m2? 1  Age > 50 (1-yes) 1  Neck circumference greater than:Male 16 inches or larger, Male 17inches or larger? 1 (30)  Male Gender (Yes=1) 1  Obstructive Sleep Apnea Score 5  Score 5 or greater  Results sent to PCP

## 2016-02-04 NOTE — Pre-Procedure Instructions (Signed)
    [facility] Lee  02/04/2016   Your procedure is scheduled on Friday, December 15.  Report to [facility] Admitting at 5:30 AM                 Your surgery or procedure is scheduled for 7:30 AM   Call this number if you have problems the morning of surgery:[phone]                 For any other questions, please call [phone], Monday - Friday 8 AM - 4 PM.   Remember:  Do not eat food or drink liquids after midnight Thursday, December 14.  Take these medicines the morning of surgery with A SIP OF WATER: metoprolol tartrate (LOPRESSOR).                 1 Week prior to surgery STOP taking Aspirin, Aspirin Products (Goody Powder, Excedrin Migraine), Ibuprofen (Advil), Naproxen (Aleve), Vitamins and Herbal Products (ie Fish Oil)   Do not wear jewelry, make-up or nail polish.  Do not wear lotions, powders, or perfumes, or deodorant.   Men may shave face and neck.  Do not bring valuables to the hospital.  [facility] is not responsible for any belongings or valuables.  Contacts, dentures or bridgework may not be worn into surgery.  Leave your suitcase in the car.  After surgery it may be brought to your room.  For patients admitted to the hospital, discharge time will be determined by your treatment team.  Patients discharged the day of surgery will not be allowed to drive home.   Name and phone number of your driver:   -  Special instructions: Review  [place] - Preparing For Surgery.  Please read over the following fact sheets that you were given. [place]- Preparing For Surgery and Patient Instructions for Mupirocin Application, Incentive Spirometry, Pain Booklet

## 2016-02-04 NOTE — Assessment & Plan Note (Signed)
Refer to pulm

## 2016-02-05 NOTE — Telephone Encounter (Signed)
I understand that; I'm sorry for any confusion; we set up the referral The cardiologist just needs to send his blessing to the surgeon I won't be a middle party between cardiology and surgeon, to avoid any confusion The clearance just needs to be voiced from cardiology directly to the surgeon Thank you

## 2016-02-07 NOTE — Telephone Encounter (Signed)
Notified. 

## 2016-02-07 NOTE — Progress Notes (Signed)
Anesthesia Chart Review:  Pt is an 82 year old male scheduled for L reverse total shoulder arthroplasty on 02/11/2016 with Netta Cedars, MD.   - PCP is Enid Derry, MD - Cardiologist is Serafina Royals, MD who has cleared pt for surgery (notes in care everywhere).   PMH includes:  CAD (s/p MI, angioplasty 1984), HTN, hyperlipidemia. Former smoker. BMI 43. S/p L TKA 04/17/14. S/p R shoulder arthroplasty 01/19/12.   Medications include: ASA, lisinopril, metoprolol, pravastatin  Preoperative labs reviewed.   EKG 11/30/15 (tracing from stress test): sinus rhythm. Poor R wave progression.   Echo 11/30/15 (care everywhere):  1. Mild LV systolic dysfunction. EF 45%. 2. Normal RV systolic function. 3. Mild MR. Mild TR. 4. Trivial AR. Trivial PR. 5. No valvular stenosis  Exercise Treadmill Test 11/30/15 Jefm Bryant): Normal treadmill ECG without evidence of ischemia or arrhythmia.  If no changes, I anticipate pt can proceed with surgery as scheduled.   Willeen Cass, FNP-BC [facility]/Anesthesiology Phone: [phone] 02/07/2016 4:11 PM

## 2016-02-10 MED ORDER — DEXTROSE 5 % IV SOLN
3.0000 g | INTRAVENOUS | Status: DC
  Filled 2016-02-10 (×2): qty 3000

## 2016-02-10 NOTE — Anesthesia Preprocedure Evaluation (Signed)
Anesthesia Evaluation  Patient identified by MRN, date of birth, ID band Patient awake    Reviewed: Allergy & Precautions, H&P , Patient's Chart, lab work & pertinent test results, reviewed documented beta blocker date and time   Airway Mallampati: II  TM Distance: >3 FB Neck ROM: full    Dental no notable dental hx.    Pulmonary former smoker,    Pulmonary exam normal breath sounds clear to auscultation       Cardiovascular hypertension, + CAD and + Past MI   Rhythm:regular Rate:Normal     Neuro/Psych    GI/Hepatic   Endo/Other    Renal/GU      Musculoskeletal   Abdominal   Peds  Hematology   Anesthesia Other Findings Hypertension      Coronary artery disease Complication of anesthesia  last surgery 02/04/16- confusion "lost a week"  Dyspnea  with exertion Myocardial infarction 1984 : EF 45%       Reproductive/Obstetrics                             Anesthesia Physical Anesthesia Plan  ASA: III  Anesthesia Plan: General   Post-op Pain Management:  Regional for Post-op pain   Induction: Intravenous  Airway Management Planned: Oral ETT  Additional Equipment:   Intra-op Plan:   Post-operative Plan: Extubation in OR  Informed Consent: I have reviewed the patients History and Physical, chart, labs and discussed the procedure including the risks, benefits and alternatives for the proposed anesthesia with the patient or authorized representative who has indicated his/her understanding and acceptance.   Dental Advisory Given and Dental advisory given  Plan Discussed with: CRNA and Surgeon  Anesthesia Plan Comments: (  Discussed general anesthesia, including possible nausea, instrumentation of airway, sore throat,pulmonary aspiration, etc. I asked if the were any outstanding questions, or  concerns before we proceeded. )        Anesthesia Quick Evaluation

## 2016-02-11 ENCOUNTER — Encounter (HOSPITAL_COMMUNITY)
Admission: RE | Disposition: A | Discharge status: Home / Self Care | Payer: Self-pay | Source: Ambulatory Visit | Attending: Orthopedic Surgery

## 2016-02-11 ENCOUNTER — Inpatient Hospital Stay (HOSPITAL_COMMUNITY)
Admission: RE | Admit: 2016-02-11 | Discharge: 2016-02-12 | DRG: 483 | Disposition: A | Discharge status: Home / Self Care | Payer: PPO | Source: Ambulatory Visit | Attending: Orthopedic Surgery | Admitting: Orthopedic Surgery

## 2016-02-11 ENCOUNTER — Encounter (HOSPITAL_COMMUNITY): Payer: Self-pay | Admitting: *Deleted

## 2016-02-11 ENCOUNTER — Inpatient Hospital Stay (HOSPITAL_COMMUNITY): Payer: PPO

## 2016-02-11 ENCOUNTER — Inpatient Hospital Stay (HOSPITAL_COMMUNITY): Payer: PPO | Admitting: Anesthesiology

## 2016-02-11 ENCOUNTER — Inpatient Hospital Stay (HOSPITAL_COMMUNITY): Payer: PPO | Admitting: Emergency Medicine

## 2016-02-11 DIAGNOSIS — Z9861 Coronary angioplasty status: Secondary | ICD-10-CM | POA: Diagnosis not present

## 2016-02-11 DIAGNOSIS — Z888 Allergy status to other drugs, medicaments and biological substances status: Secondary | ICD-10-CM

## 2016-02-11 DIAGNOSIS — M810 Age-related osteoporosis without current pathological fracture: Secondary | ICD-10-CM | POA: Diagnosis present

## 2016-02-11 DIAGNOSIS — Z885 Allergy status to narcotic agent status: Secondary | ICD-10-CM | POA: Diagnosis not present

## 2016-02-11 DIAGNOSIS — I252 Old myocardial infarction: Secondary | ICD-10-CM | POA: Diagnosis not present

## 2016-02-11 DIAGNOSIS — M5136 Other intervertebral disc degeneration, lumbar region: Secondary | ICD-10-CM | POA: Diagnosis present

## 2016-02-11 DIAGNOSIS — Z96653 Presence of artificial knee joint, bilateral: Secondary | ICD-10-CM | POA: Diagnosis present

## 2016-02-11 DIAGNOSIS — I2510 Atherosclerotic heart disease of native coronary artery without angina pectoris: Secondary | ICD-10-CM | POA: Diagnosis present

## 2016-02-11 DIAGNOSIS — E785 Hyperlipidemia, unspecified: Secondary | ICD-10-CM | POA: Diagnosis present

## 2016-02-11 DIAGNOSIS — Z79899 Other long term (current) drug therapy: Secondary | ICD-10-CM

## 2016-02-11 DIAGNOSIS — Z96612 Presence of left artificial shoulder joint: Secondary | ICD-10-CM

## 2016-02-11 DIAGNOSIS — Z7982 Long term (current) use of aspirin: Secondary | ICD-10-CM

## 2016-02-11 DIAGNOSIS — Z87891 Personal history of nicotine dependence: Secondary | ICD-10-CM | POA: Diagnosis not present

## 2016-02-11 DIAGNOSIS — M19079 Primary osteoarthritis, unspecified ankle and foot: Secondary | ICD-10-CM | POA: Diagnosis present

## 2016-02-11 DIAGNOSIS — M19012 Primary osteoarthritis, left shoulder: Secondary | ICD-10-CM | POA: Diagnosis present

## 2016-02-11 DIAGNOSIS — I10 Essential (primary) hypertension: Secondary | ICD-10-CM | POA: Diagnosis present

## 2016-02-11 DIAGNOSIS — G4730 Sleep apnea, unspecified: Secondary | ICD-10-CM | POA: Diagnosis present

## 2016-02-11 DIAGNOSIS — Z791 Long term (current) use of non-steroidal anti-inflammatories (NSAID): Secondary | ICD-10-CM | POA: Diagnosis not present

## 2016-02-11 DIAGNOSIS — M6281 Muscle weakness (generalized): Secondary | ICD-10-CM

## 2016-02-11 DIAGNOSIS — Z96611 Presence of right artificial shoulder joint: Secondary | ICD-10-CM | POA: Diagnosis present

## 2016-02-11 DIAGNOSIS — M75102 Unspecified rotator cuff tear or rupture of left shoulder, not specified as traumatic: Secondary | ICD-10-CM | POA: Diagnosis present

## 2016-02-11 HISTORY — PX: REVERSE TOTAL SHOULDER ARTHROPLASTY: SHX2344

## 2016-02-11 HISTORY — PX: REVERSE SHOULDER ARTHROPLASTY: SHX5054

## 2016-02-11 SURGERY — ARTHROPLASTY, SHOULDER, TOTAL, REVERSE
Anesthesia: General | Site: Shoulder | Laterality: Left

## 2016-02-11 MED ORDER — LORATADINE 10 MG PO TABS
10.0000 mg | ORAL_TABLET | Freq: Every day | ORAL | Status: DC
  Administered 2016-02-12: 10 mg via ORAL
  Filled 2016-02-11: qty 1

## 2016-02-11 MED ORDER — POLYETHYLENE GLYCOL 3350 17 G PO PACK: 17.0000 g | PACK | Freq: Every day | ORAL | Status: DC | PRN

## 2016-02-11 MED ORDER — ONDANSETRON HCL 4 MG PO TABS: 4.0000 mg | ORAL_TABLET | Freq: Four times a day (QID) | ORAL | Status: DC | PRN

## 2016-02-11 MED ORDER — SUGAMMADEX SODIUM 200 MG/2ML IV SOLN
INTRAVENOUS | Status: DC | PRN
  Administered 2016-02-11: 200 mg via INTRAVENOUS

## 2016-02-11 MED ORDER — TRAMADOL HCL 50 MG PO TABS
50.0000 mg | ORAL_TABLET | Freq: Four times a day (QID) | ORAL | Status: DC
  Administered 2016-02-11 – 2016-02-12 (×3): 50 mg via ORAL
  Filled 2016-02-11 (×3): qty 1

## 2016-02-11 MED ORDER — PROPOFOL 10 MG/ML IV BOLUS
INTRAVENOUS | Status: AC
  Filled 2016-02-11: qty 20

## 2016-02-11 MED ORDER — THROMBIN 5000 UNITS EX SOLR
CUTANEOUS | Status: AC
  Filled 2016-02-11: qty 5000

## 2016-02-11 MED ORDER — METOPROLOL TARTRATE 25 MG PO TABS
25.0000 mg | ORAL_TABLET | Freq: Two times a day (BID) | ORAL | Status: DC
  Administered 2016-02-11 – 2016-02-12 (×3): 25 mg via ORAL
  Filled 2016-02-11 (×3): qty 1

## 2016-02-11 MED ORDER — SODIUM CHLORIDE 0.9 % IV SOLN
INTRAVENOUS | Status: DC
  Administered 2016-02-11: 15:00:00 via INTRAVENOUS

## 2016-02-11 MED ORDER — ACETAMINOPHEN 650 MG RE SUPP: 650.0000 mg | Freq: Four times a day (QID) | RECTAL | Status: DC | PRN

## 2016-02-11 MED ORDER — BUPIVACAINE-EPINEPHRINE 0.25% -1:200000 IJ SOLN
INTRAMUSCULAR | Status: DC | PRN
  Administered 2016-02-11: 9 mL

## 2016-02-11 MED ORDER — LACTATED RINGERS IV SOLN
INTRAVENOUS | Status: DC | PRN
  Administered 2016-02-11: 07:00:00 via INTRAVENOUS

## 2016-02-11 MED ORDER — MORPHINE SULFATE (PF) 2 MG/ML IV SOLN: 1.0000 mg | INTRAVENOUS | Status: DC | PRN

## 2016-02-11 MED ORDER — PHENYLEPHRINE HCL 10 MG/ML IJ SOLN
INTRAMUSCULAR | Status: DC | PRN
  Administered 2016-02-11: 80 ug via INTRAVENOUS
  Administered 2016-02-11: 40 ug via INTRAVENOUS

## 2016-02-11 MED ORDER — EPHEDRINE 5 MG/ML INJ
INTRAVENOUS | Status: AC
  Filled 2016-02-11: qty 10

## 2016-02-11 MED ORDER — MEPERIDINE HCL 25 MG/ML IJ SOLN: 6.2500 mg | INTRAMUSCULAR | Status: DC | PRN

## 2016-02-11 MED ORDER — PROPOFOL 10 MG/ML IV BOLUS
INTRAVENOUS | Status: DC | PRN
Start: 2016-02-11 — End: 2016-02-11
  Administered 2016-02-11: 120 mg via INTRAVENOUS

## 2016-02-11 MED ORDER — TRAMADOL HCL 50 MG PO TABS: 50.0000 mg | ORAL_TABLET | Freq: Four times a day (QID) | ORAL | 0 refills | Status: DC | PRN

## 2016-02-11 MED ORDER — VITAMIN E 180 MG (400 UNIT) PO CAPS
400.0000 [IU] | ORAL_CAPSULE | Freq: Every day | ORAL | Status: DC
  Administered 2016-02-11: 400 [IU] via ORAL
  Filled 2016-02-11 (×2): qty 1

## 2016-02-11 MED ORDER — ONDANSETRON HCL 4 MG/2ML IJ SOLN
INTRAMUSCULAR | Status: AC
  Filled 2016-02-11: qty 2

## 2016-02-11 MED ORDER — ONDANSETRON HCL 4 MG/2ML IJ SOLN
INTRAMUSCULAR | Status: DC | PRN
  Administered 2016-02-11: 4 mg via INTRAVENOUS

## 2016-02-11 MED ORDER — ROCURONIUM BROMIDE 100 MG/10ML IV SOLN
INTRAVENOUS | Status: DC | PRN
  Administered 2016-02-11: 40 mg via INTRAVENOUS

## 2016-02-11 MED ORDER — PHENYLEPHRINE HCL 10 MG/ML IJ SOLN
INTRAMUSCULAR | Status: DC | PRN
  Administered 2016-02-11: 25 ug/min via INTRAVENOUS

## 2016-02-11 MED ORDER — ONDANSETRON HCL 4 MG/2ML IJ SOLN: 4.0000 mg | Freq: Four times a day (QID) | INTRAMUSCULAR | Status: DC | PRN

## 2016-02-11 MED ORDER — PRAVASTATIN SODIUM 40 MG PO TABS
40.0000 mg | ORAL_TABLET | Freq: Every evening | ORAL | Status: DC
  Administered 2016-02-11: 40 mg via ORAL
  Filled 2016-02-11: qty 1

## 2016-02-11 MED ORDER — FENTANYL CITRATE (PF) 100 MCG/2ML IJ SOLN
INTRAMUSCULAR | Status: AC
  Filled 2016-02-11: qty 2

## 2016-02-11 MED ORDER — PHENOL 1.4 % MT LIQD: 1.0000 | OROMUCOSAL | Status: DC | PRN

## 2016-02-11 MED ORDER — SUGAMMADEX SODIUM 200 MG/2ML IV SOLN
INTRAVENOUS | Status: AC
  Filled 2016-02-11: qty 2

## 2016-02-11 MED ORDER — ONDANSETRON HCL 4 MG/2ML IJ SOLN: 4.0000 mg | Freq: Once | INTRAMUSCULAR | Status: DC | PRN

## 2016-02-11 MED ORDER — 0.9 % SODIUM CHLORIDE (POUR BTL) OPTIME
TOPICAL | Status: DC | PRN
  Administered 2016-02-11: 1000 mL

## 2016-02-11 MED ORDER — LISINOPRIL 5 MG PO TABS
5.0000 mg | ORAL_TABLET | Freq: Every day | ORAL | Status: DC
  Administered 2016-02-11 – 2016-02-12 (×2): 5 mg via ORAL
  Filled 2016-02-11 (×3): qty 1

## 2016-02-11 MED ORDER — METOCLOPRAMIDE HCL 5 MG/ML IJ SOLN: 5.0000 mg | Freq: Three times a day (TID) | INTRAMUSCULAR | Status: DC | PRN

## 2016-02-11 MED ORDER — CHLORHEXIDINE GLUCONATE 4 % EX LIQD: 60.0000 mL | Freq: Once | CUTANEOUS | Status: DC

## 2016-02-11 MED ORDER — BUPIVACAINE HCL (PF) 0.5 % IJ SOLN
INTRAMUSCULAR | Status: DC | PRN
  Administered 2016-02-11: 20 mL via PERINEURAL

## 2016-02-11 MED ORDER — NAPROXEN 250 MG PO TABS
500.0000 mg | ORAL_TABLET | Freq: Every day | ORAL | Status: DC | PRN
  Administered 2016-02-11: 500 mg via ORAL
  Filled 2016-02-11 (×2): qty 2

## 2016-02-11 MED ORDER — ADULT MULTIVITAMIN W/MINERALS CH
1.0000 | ORAL_TABLET | Freq: Every day | ORAL | Status: DC
  Administered 2016-02-11: 1 via ORAL
  Filled 2016-02-11 (×2): qty 1

## 2016-02-11 MED ORDER — BUPIVACAINE-EPINEPHRINE (PF) 0.25% -1:200000 IJ SOLN
INTRAMUSCULAR | Status: AC
  Filled 2016-02-11: qty 30

## 2016-02-11 MED ORDER — METOCLOPRAMIDE HCL 5 MG PO TABS
5.0000 mg | ORAL_TABLET | Freq: Three times a day (TID) | ORAL | Status: DC | PRN
Start: 2016-02-11 — End: 2016-02-12

## 2016-02-11 MED ORDER — MENTHOL 3 MG MT LOZG
1.0000 | LOZENGE | OROMUCOSAL | Status: DC | PRN
Start: 2016-02-11 — End: 2016-02-12

## 2016-02-11 MED ORDER — FENTANYL CITRATE (PF) 100 MCG/2ML IJ SOLN: 25.0000 ug | INTRAMUSCULAR | Status: DC | PRN

## 2016-02-11 MED ORDER — FENTANYL CITRATE (PF) 100 MCG/2ML IJ SOLN
INTRAMUSCULAR | Status: DC | PRN
  Administered 2016-02-11: 50 ug via INTRAVENOUS

## 2016-02-11 MED ORDER — PHENYLEPHRINE 40 MCG/ML (10ML) SYRINGE FOR IV PUSH (FOR BLOOD PRESSURE SUPPORT)
PREFILLED_SYRINGE | INTRAVENOUS | Status: AC
  Filled 2016-02-11: qty 10

## 2016-02-11 MED ORDER — EPHEDRINE SULFATE-NACL 50-0.9 MG/10ML-% IV SOSY
PREFILLED_SYRINGE | INTRAVENOUS | Status: DC | PRN
  Administered 2016-02-11: 5 mg via INTRAVENOUS

## 2016-02-11 MED ORDER — DOCUSATE SODIUM 100 MG PO CAPS
100.0000 mg | ORAL_CAPSULE | Freq: Two times a day (BID) | ORAL | Status: DC
  Administered 2016-02-11 – 2016-02-12 (×3): 100 mg via ORAL
  Filled 2016-02-11 (×3): qty 1

## 2016-02-11 MED ORDER — CEFAZOLIN SODIUM-DEXTROSE 2-4 GM/100ML-% IV SOLN
2.0000 g | Freq: Four times a day (QID) | INTRAVENOUS | Status: AC
  Administered 2016-02-11 – 2016-02-12 (×3): 2 g via INTRAVENOUS
  Filled 2016-02-11 (×3): qty 100

## 2016-02-11 MED ORDER — ASPIRIN 325 MG PO TABS
325.0000 mg | ORAL_TABLET | Freq: Every day | ORAL | Status: DC
  Administered 2016-02-11 – 2016-02-12 (×2): 325 mg via ORAL
  Filled 2016-02-11 (×3): qty 1

## 2016-02-11 MED ORDER — LIDOCAINE HCL (CARDIAC) 20 MG/ML IV SOLN
INTRAVENOUS | Status: DC | PRN
Start: 2016-02-11 — End: 2016-02-11
  Administered 2016-02-11: 100 mg via INTRAVENOUS

## 2016-02-11 MED ORDER — ACETAMINOPHEN 325 MG PO TABS: 650.0000 mg | ORAL_TABLET | Freq: Four times a day (QID) | ORAL | Status: DC | PRN

## 2016-02-11 SURGICAL SUPPLY — 69 items
BIT DRILL 170X2.5X (BIT) ×1 IMPLANT
BIT DRILL 5/64X5 DISP (BIT) ×2 IMPLANT
BIT DRL 170X2.5X (BIT) ×1
BLADE SAG 18X100X1.27 (BLADE) ×2 IMPLANT
CAPT SHLDR REVTOTAL 1 ×2 IMPLANT
CLSR STERI-STRIP ANTIMIC 1/2X4 (GAUZE/BANDAGES/DRESSINGS) ×2 IMPLANT
COVER SURGICAL LIGHT HANDLE (MISCELLANEOUS) ×2 IMPLANT
DRAPE IMP U-DRAPE 54X76 (DRAPES) ×4 IMPLANT
DRAPE INCISE IOBAN 66X45 STRL (DRAPES) ×2 IMPLANT
DRAPE ORTHO SPLIT 77X108 STRL (DRAPES) ×2
DRAPE SURG ORHT 6 SPLT 77X108 (DRAPES) ×2 IMPLANT
DRAPE U-SHAPE 47X51 STRL (DRAPES) ×2 IMPLANT
DRAPE X-RAY CASS 24X20 (DRAPES) IMPLANT
DRILL 2.5 (BIT) ×1
DRSG ADAPTIC 3X8 NADH LF (GAUZE/BANDAGES/DRESSINGS) ×2 IMPLANT
DRSG PAD ABDOMINAL 8X10 ST (GAUZE/BANDAGES/DRESSINGS) ×2 IMPLANT
DURAPREP 26ML APPLICATOR (WOUND CARE) ×2 IMPLANT
ELECT BLADE 4.0 EZ CLEAN MEGAD (MISCELLANEOUS) ×2
ELECT NEEDLE TIP 2.8 STRL (NEEDLE) ×2 IMPLANT
ELECT REM PT RETURN 9FT ADLT (ELECTROSURGICAL) ×2
ELECTRODE BLDE 4.0 EZ CLN MEGD (MISCELLANEOUS) ×1 IMPLANT
ELECTRODE REM PT RTRN 9FT ADLT (ELECTROSURGICAL) ×1 IMPLANT
GAUZE SPONGE 4X4 12PLY STRL (GAUZE/BANDAGES/DRESSINGS) ×2 IMPLANT
GLOVE BIOGEL PI ORTHO PRO 7.5 (GLOVE) ×1
GLOVE BIOGEL PI ORTHO PRO SZ8 (GLOVE) ×1
GLOVE ORTHO TXT STRL SZ7.5 (GLOVE) ×2 IMPLANT
GLOVE PI ORTHO PRO STRL 7.5 (GLOVE) ×1 IMPLANT
GLOVE PI ORTHO PRO STRL SZ8 (GLOVE) ×1 IMPLANT
GLOVE SURG ORTHO 8.5 STRL (GLOVE) ×2 IMPLANT
GOWN STRL REUS W/ TWL LRG LVL3 (GOWN DISPOSABLE) IMPLANT
GOWN STRL REUS W/ TWL XL LVL3 (GOWN DISPOSABLE) ×4 IMPLANT
GOWN STRL REUS W/TWL LRG LVL3 (GOWN DISPOSABLE)
GOWN STRL REUS W/TWL XL LVL3 (GOWN DISPOSABLE) ×4
HANDPIECE INTERPULSE COAX TIP (DISPOSABLE)
KIT BASIN OR (CUSTOM PROCEDURE TRAY) ×2 IMPLANT
KIT ROOM TURNOVER OR (KITS) ×2 IMPLANT
MANIFOLD NEPTUNE II (INSTRUMENTS) ×2 IMPLANT
NEEDLE 1/2 CIR MAYO (NEEDLE) ×2 IMPLANT
NEEDLE HYPO 25GX1X1/2 BEV (NEEDLE) ×2 IMPLANT
NS IRRIG 1000ML POUR BTL (IV SOLUTION) ×2 IMPLANT
PACK SHOULDER (CUSTOM PROCEDURE TRAY) ×2 IMPLANT
PAD ARMBOARD 7.5X6 YLW CONV (MISCELLANEOUS) ×4 IMPLANT
PIN GUIDE 1.2 (PIN) ×2 IMPLANT
PIN GUIDE GLENOPHERE 1.5MX300M (PIN) ×2 IMPLANT
PIN METAGLENE 2.5 (PIN) ×2 IMPLANT
SET HNDPC FAN SPRY TIP SCT (DISPOSABLE) IMPLANT
SLING ARM FOAM STRAP LRG (SOFTGOODS) ×2 IMPLANT
SLING ARM LRG ADULT FOAM STRAP (SOFTGOODS) IMPLANT
SLING ARM MED ADULT FOAM STRAP (SOFTGOODS) IMPLANT
SLING ARM XL FOAM STRAP (SOFTGOODS) ×2 IMPLANT
SPONGE LAP 18X18 X RAY DECT (DISPOSABLE) IMPLANT
SPONGE LAP 4X18 X RAY DECT (DISPOSABLE) ×2 IMPLANT
STRIP CLOSURE SKIN 1/2X4 (GAUZE/BANDAGES/DRESSINGS) ×2 IMPLANT
SUCTION FRAZIER HANDLE 10FR (MISCELLANEOUS) ×1
SUCTION TUBE FRAZIER 10FR DISP (MISCELLANEOUS) ×1 IMPLANT
SUT FIBERWIRE #2 38 T-5 BLUE (SUTURE) ×4
SUT MNCRL AB 4-0 PS2 18 (SUTURE) ×2 IMPLANT
SUT VIC AB 2-0 CT1 27 (SUTURE) ×1
SUT VIC AB 2-0 CT1 TAPERPNT 27 (SUTURE) ×1 IMPLANT
SUT VICRYL 0 CT 1 36IN (SUTURE) ×2 IMPLANT
SUTURE FIBERWR #2 38 T-5 BLUE (SUTURE) ×2 IMPLANT
SYR CONTROL 10ML LL (SYRINGE) ×2 IMPLANT
TAPE CLOTH SURG 6X10 WHT LF (GAUZE/BANDAGES/DRESSINGS) ×2 IMPLANT
TOWEL OR 17X24 6PK STRL BLUE (TOWEL DISPOSABLE) ×2 IMPLANT
TOWEL OR 17X26 10 PK STRL BLUE (TOWEL DISPOSABLE) ×2 IMPLANT
TOWER CARTRIDGE SMART MIX (DISPOSABLE) IMPLANT
TRAY FOLEY CATH 16FRSI W/METER (SET/KITS/TRAYS/PACK) IMPLANT
WATER STERILE IRR 1000ML POUR (IV SOLUTION) ×2 IMPLANT
YANKAUER SUCT BULB TIP NO VENT (SUCTIONS) ×2 IMPLANT

## 2016-02-11 NOTE — Transfer of Care (Signed)
Immediate Anesthesia Transfer of Care Note  Patient: [facility]  Procedure(s) Performed: Procedure(s): LEFT REVERSE TOTAL SHOULDER ARTHROPLASTY (Left)  Patient Location: PACU  Anesthesia Type:GA combined with regional for post-op pain  Level of Consciousness: awake, alert  and oriented  Airway & Oxygen Therapy: Patient Spontanous Breathing and Patient connected to face mask oxygen  Post-op Assessment: Report given to RN, Post -op Vital signs reviewed and stable and Patient moving all extremities X 4  Post vital signs: Reviewed and stable  Last Vitals:  Vitals:   02/11/16 0600  BP: 136/87  Pulse: 68  Resp: 20  Temp: 36.8 C    Last Pain:  Vitals:   02/11/16 0600  TempSrc: Oral         Complications: No apparent anesthesia complications

## 2016-02-11 NOTE — Interval H&P Note (Signed)
History and Physical Interval Note:  02/11/2016 7:19 AM  Ricky Lee  has presented today for surgery, with the diagnosis of left shoulder osteoarthritis and rotator cuff insufficiency  The various methods of treatment have been discussed with the patient and family. After consideration of risks, benefits and other options for treatment, the patient has consented to  Procedure(s): LEFT REVERSE TOTAL SHOULDER ARTHROPLASTY (Left) as a surgical intervention .  The patient's history has been reviewed, patient examined, no change in status, stable for surgery.  I have reviewed the patient's chart and labs.  Questions were answered to the patient's satisfaction.     [facility],STEVEN R

## 2016-02-11 NOTE — Anesthesia Procedure Notes (Addendum)
Anesthesia Regional Block:  Interscalene brachial plexus block  Pre-Anesthetic Checklist: ,, timeout performed, Correct Patient, Correct Site, Correct Laterality, Correct Procedure, Correct Position, site marked, Risks and benefits discussed, pre-op evaluation,  At surgeon's request and post-op pain management  Laterality: Left  Prep: chloraprep       Needles:   Needle Type: Echogenic Needle          Additional Needles:  Procedures: ultrasound guided (picture in chart) Interscalene brachial plexus block Narrative:  Start time: 02/11/2016 7:10 AM End time: 02/11/2016 7:21 AM Injection made incrementally with aspirations every 5 mL. Anesthesiologist: Lyndle Herrlich  Additional Notes: 20cc .5% Bupivicaine

## 2016-02-11 NOTE — Brief Op Note (Signed)
02/11/2016  9:37 AM  PATIENT:  Michael Boston Pietro  82 y.o. male  PRE-OPERATIVE DIAGNOSIS:  left shoulder osteoarthritis and rotator cuff insufficiency  POST-OPERATIVE DIAGNOSIS:  left shoulder osteoarthritis and rotator cuff insufficiency  PROCEDURE:  Procedure(s): LEFT REVERSE TOTAL SHOULDER ARTHROPLASTY (Left)  DePuy Delta Xtend SURGEON:  Surgeon(s) and Role:    * Netta Cedars, MD - Primary  PHYSICIAN ASSISTANT:   ASSISTANTS: Ventura Bruns, PA-C   ANESTHESIA:   regional and general  EBL:  Total I/O In: 900 [I.V.:900] Out: 150 [Blood:150]  BLOOD ADMINISTERED:none  DRAINS: none   LOCAL MEDICATIONS USED:  MARCAINE     SPECIMEN:  No Specimen  DISPOSITION OF SPECIMEN:  N/A  COUNTS:  YES  TOURNIQUET:  * No tourniquets in log *  DICTATION: .Other Dictation: Dictation Number [id number]  PLAN OF CARE: Admit to inpatient   PATIENT DISPOSITION:  PACU - hemodynamically stable.   Delay start of Pharmacological VTE agent (>24hrs) due to surgical blood loss or risk of bleeding: not applicable

## 2016-02-11 NOTE — Anesthesia Postprocedure Evaluation (Signed)
Anesthesia Post Note  Patient: Ricky Lee  Procedure(s) Performed: Procedure(s) (LRB): LEFT REVERSE TOTAL SHOULDER ARTHROPLASTY (Left)  Patient location during evaluation: PACU Anesthesia Type: General Level of consciousness: sedated Pain management: satisfactory to patient Vital Signs Assessment: post-procedure vital signs reviewed and stable Respiratory status: spontaneous breathing Cardiovascular status: stable Anesthetic complications: no Comments: comfortable    Last Vitals:  Vitals:   02/11/16 1032 02/11/16 1116  BP: 121/87 130/83  Pulse: 87 77  Resp: 20 18  Temp:  36.5 C    Last Pain:  Vitals:   02/11/16 1116  TempSrc: Oral  PainSc:                  Riccardo Dubin

## 2016-02-11 NOTE — Discharge Instructions (Signed)
Ice to the shoulder as much as you can.  Keep the incision clean and covered and dry for one week, then ok to get it wet in the shower.  Keep a pillow or blanket propped behind the left elbow to keep the arm across your body while seated or reclining.  Ok to use the left arm for light ADLs and also for balance.  DO NOT push your full body weight up with the shoulder  Follow up with Dr [name] in two weeks in the office [phone]

## 2016-02-11 NOTE — Anesthesia Procedure Notes (Signed)
Procedure Name: Intubation Date/Time: 02/11/2016 7:42 AM Performed by: Kyung Rudd Pre-anesthesia Checklist: Patient identified, Emergency Drugs available, Suction available and Patient being monitored Patient Re-evaluated:Patient Re-evaluated prior to inductionOxygen Delivery Method: Circle system utilized Preoxygenation: Pre-oxygenation with 100% oxygen Intubation Type: IV induction Ventilation: Mask ventilation without difficulty Laryngoscope Size: Glidescope (Electively used Glidescope.) Tube type: Oral Tube size: 7.5 mm Number of attempts: 1 Airway Equipment and Method: Video-laryngoscopy Placement Confirmation: ETT inserted through vocal cords under direct vision,  positive ETCO2 and breath sounds checked- equal and bilateral Secured at: 22 cm Tube secured with: Tape Dental Injury: Teeth and Oropharynx as per pre-operative assessment  Comments: Electively used Glidescope per Dr. Seward Speck.

## 2016-02-12 LAB — BASIC METABOLIC PANEL
ANION GAP: 9 (ref 5–15)
BUN: 22 mg/dL — ABNORMAL HIGH (ref 6–20)
CO2: 23 mmol/L (ref 22–32)
Calcium: 8.6 mg/dL — ABNORMAL LOW (ref 8.9–10.3)
Chloride: 104 mmol/L (ref 101–111)
Creatinine, Ser: 1.24 mg/dL (ref 0.61–1.24)
GFR calc Af Amer: >60 mL/min (ref >60)
GFR, EST NON AFRICAN AMERICAN: 53 mL/min — AB (ref >60)
GLUCOSE: 121 mg/dL — AB (ref 65–99)
POTASSIUM: 4.2 mmol/L (ref 3.5–5.1)
Sodium: 136 mmol/L (ref 135–145)

## 2016-02-12 LAB — HEMOGLOBIN AND HEMATOCRIT, BLOOD
HCT: 38.6 % — ABNORMAL LOW (ref 39.0–52.0)
Hemoglobin: 12.6 g/dL — ABNORMAL LOW (ref 13.0–17.0)

## 2016-02-12 NOTE — Progress Notes (Signed)
Orthopedics Progress Note  Subjective: No complaints this morning.  Ready to go home  Objective:  Vitals:   02/11/16 2100 02/12/16 0607  BP: 106/66 (!) 109/56  Pulse: (!) 107 92  Resp: 18 17  Temp: 99.8 F (37.7 C) 99.6 F (37.6 C)    General: Awake and alert  Musculoskeletal: left shoulder dressing with some spotting, otherwise 5/5 motor and sensation intact Neurovascularly intact  Lab Results  Component Value Date   WBC 7.9 02/04/2016   HGB 12.6 (L) 02/12/2016   HCT 38.6 (L) 02/12/2016   MCV 96.5 02/04/2016   PLT 177 02/04/2016       Component Value Date/Time   NA 136 02/12/2016 0504   NA 142 05/17/2015 0822   K 4.2 02/12/2016 0504   CL 104 02/12/2016 0504   CO2 23 02/12/2016 0504   GLUCOSE 121 (H) 02/12/2016 0504   BUN 22 (H) 02/12/2016 0504   BUN 18 05/17/2015 0822   CREATININE 1.24 02/12/2016 0504   CREATININE 1.06 11/15/2015 1408   CALCIUM 8.6 (L) 02/12/2016 0504   GFRNONAA 53 (L) 02/12/2016 0504   GFRNONAA 66 11/15/2015 1408   GFRAA >60 02/12/2016 0504   GFRAA 76 11/15/2015 1408    Lab Results  Component Value Date   INR 1.03 04/07/2014   INR 0.94 11/10/2009    Assessment/Plan: POD #1 s/p Procedure(s): LEFT REVERSE TOTAL SHOULDER ARTHROPLASTY D/C to home with HEP Follow up in two weeks with Dr Esmond Plants R. [name], MD 02/12/2016 9:26 AM

## 2016-02-12 NOTE — Evaluation (Signed)
Physical Therapy Evaluation Patient Details Name: Ricky Lee MRN: [id number] DOB: [birth year] Today's Date: 02/12/2016   History of Present Illness  82 y/o male s/p left reverse total shoulder arthroplasty. Pt  has a past medical history of Arthritis; Cancer; Degenerative lumbar disc; Dyspnea; Hyperlipemia; Hypertension; Myocardial infarction (1984); OA ; Osteoporosis (05/05/2015); and Trigger little finger of left hand. Pt has a past surgical history that includes Rotator cuff repair (Right); Tongue surgery; Leg Surgery; Biceps tendon repair; Cholecystectomy; Cardiac catheterization; right foot surgery; Tonsillectomy; Coronary angioplasty (1984); Total shoulder arthroplasty (01/19/2012); Irrigation and debridement shoulder (02/09/2012); Joint replacement (1999); Joint replacement (2014); Eye surgery (Bilateral); Total knee arthroplasty (Left, 04/17/2014); Skin cancer excision; and Colonoscopy w/ polypectomy.  Clinical Impression  Pt presents POD 1 following the above procedure. Pt is moving well with therapy and is able to perform longer distance gait x 125'x2 with 1-2 min rest break following stair negotiation that is not atypical for him. Instructed pt on the importance of continuing to walk frequently at home in order to improve endurance and maintain strength and on the possibility of balance loss with LUE in a sling. Pt is aware and verbalizes understanding. All education is complete and no further PT services required at this time.     Follow Up Recommendations No PT follow up    Equipment Recommendations  None recommended by PT    Recommendations for Other Services       Precautions / Restrictions Precautions Precautions: Shoulder;Fall Type of Shoulder Precautions: Active Protocol Shoulder Interventions: Shoulder sling/immobilizer;For comfort Precaution Booklet Issued: Yes (comment) Precaution Comments: OT shoulder protocol handout reviewed in full Required Braces or Orthoses:  Sling Restrictions Weight Bearing Restrictions: Yes LUE Weight Bearing: Non weight bearing Other Position/Activity Restrictions: FF 0-90, Abduction 0-60, ER 0-30, ADL's OK      Mobility  Bed Mobility Overal bed mobility: Needs Assistance Bed Mobility: Supine to Sit     Supine to sit: Min assist     General bed mobility comments: recieved in recliner  Transfers Overall transfer level: Needs assistance Equipment used: None Transfers: Sit to/from Stand Sit to Stand: Supervision         General transfer comment: no attempt to push up with LUE  Ambulation/Gait Ambulation/Gait assistance: Supervision Ambulation Distance (Feet): 250 Feet Assistive device: None Gait Pattern/deviations: WFL(Within Functional Limits) Gait velocity: decreased Gait velocity interpretation: Below normal speed for age/gender General Gait Details: wide BOS possibly due to bilateral TKA's, baseline per client  Stairs Stairs: Yes Stairs assistance: Supervision Stair Management: One rail Right;Alternating pattern;Forwards Number of Stairs: 2 General stair comments: ascend and descend 2 steps with no attempt to use LUE  Wheelchair Mobility    Modified Rankin (Stroke Patients Only)       Balance Overall balance assessment: Needs assistance Sitting-balance support: No upper extremity supported;Feet supported Sitting balance-Leahy Scale: Good Sitting balance - Comments: sitting EOB for exercises   Standing balance support: No upper extremity supported Standing balance-Leahy Scale: Fair Standing balance comment: standing at sink, trunk supported by sink edge                             Pertinent Vitals/Pain Pain Assessment: 0-10 Pain Score: 4  Pain Location: headache, none from shoulder Pain Descriptors / Indicators: Headache Pain Intervention(s): Monitored during session    Home Living Family/patient expects to be discharged to:: Private residence Living Arrangements:  Spouse/significant other Available Help at Discharge: Family;Available 24 hours/day Type  of Home: House Home Access: Stairs to enter Entrance Stairs-Rails: Right;Left;Can reach both Entrance Stairs-Number of Steps: 2 Home Layout: One level Home Equipment: Walker - 2 wheels;Cane - single point;Grab bars - toilet;Wheelchair - manual      Prior Function Level of Independence: Independent         Comments: performing housework including baking a cooking     Hand Dominance   Dominant Hand: Right    Extremity/Trunk Assessment   Upper Extremity Assessment Upper Extremity Assessment: Defer to OT evaluation LUE Deficits / Details: typical post-op decreased ROM and strength    Lower Extremity Assessment Lower Extremity Assessment: Overall WFL for tasks assessed    Cervical / Trunk Assessment Cervical / Trunk Assessment: Other exceptions Cervical / Trunk Exceptions: history of back sugeries  Communication   Communication: No difficulties  Cognition Arousal/Alertness: Awake/alert Behavior During Therapy: WFL for tasks assessed/performed Overall Cognitive Status: Within Functional Limits for tasks assessed                      General Comments      Exercises Shoulder Exercises Shoulder Flexion: PROM;AROM;Left;10 reps;Supine (0-90) Shoulder ABduction: PROM;AROM;Left;10 reps;Supine;Seated (0-60) Shoulder External Rotation: PROM;AROM;Left;10 reps;Seated (0-30) Elbow Flexion: PROM;AROM;Left;10 reps;Standing Elbow Extension: PROM;AROM;Left;10 reps;Standing Wrist Flexion: AROM;Left Wrist Extension: AROM;Left Digit Composite Flexion: AROM;Left Composite Extension: AROM;Left Neck Flexion: AROM Neck Extension: AROM Neck Lateral Flexion - Right: AROM Neck Lateral Flexion - Left: AROM Donning/doffing shirt without moving shoulder: Maximal assistance;Patient able to independently direct caregiver Method for sponge bathing under operated UE:  Supervision/safety Donning/doffing sling/immobilizer: Moderate assistance;Patient able to independently direct caregiver Correct positioning of sling/immobilizer: Modified independent;Patient able to independently direct caregiver ROM for elbow, wrist and digits of operated UE: Independent Sling wearing schedule (on at all times/off for ADL's): Independent Proper positioning of operated UE when showering: Minimal assistance;Patient able to independently direct caregiver Positioning of UE while sleeping: Modified independent   Assessment/Plan    PT Assessment Patent does not need any further PT services  PT Problem List            PT Treatment Interventions      PT Goals (Current goals can be found in the Care Plan section)  Acute Rehab PT Goals Patient Stated Goal: to get home    Frequency     Barriers to discharge        Co-evaluation               End of Session Equipment Utilized During Treatment: Gait belt;Other (comment) (sling LUE) Activity Tolerance: Patient tolerated treatment well Patient left: in chair;with call bell/phone within reach Nurse Communication: Mobility status         Time: 1020-1041 PT Time Calculation (min) (ACUTE ONLY): 21 min   Charges:   PT Evaluation $PT Eval Low Complexity: 1 Procedure     PT G Codes:        Scheryl Marten PT, DPT  [phone]  02/12/2016, 1:54 PM

## 2016-02-12 NOTE — Discharge Summary (Signed)
Physician Discharge Summary   Patient ID: Ricky Lee MRN: [id number] DOB/AGE: 1933/12/12 82 y.o.  Admit date: 02/11/2016 Discharge date: 02/12/2016  Admission Diagnoses:  Active Problems:   S/P shoulder replacement, left   Discharge Diagnoses:  Same   Surgeries: Procedure(s): LEFT REVERSE TOTAL SHOULDER ARTHROPLASTY on 02/11/2016   Consultants: OT  Discharged Condition: Stable  Hospital Course: Ricky Lee is an 82 y.o. male who was admitted 02/11/2016 with a chief complaint of left shoulder pain, and found to have a diagnosis of left shoulder rotator cuff tear arthropathy.  They were brought to the operating room on 02/11/2016 and underwent the above named procedures.    The patient had an uncomplicated hospital course and was stable for discharge.  Recent vital signs:  Vitals:   02/11/16 2100 02/12/16 0607  BP: 106/66 (!) 109/56  Pulse: (!) 107 92  Resp: 18 17  Temp: 99.8 F (37.7 C) 99.6 F (37.6 C)    Recent laboratory studies:  Results for orders placed or performed during the hospital encounter of 02/11/16  Hemoglobin and hematocrit, blood  Result Value Ref Range   Hemoglobin 12.6 (L) 13.0 - 17.0 g/dL   HCT 38.6 (L) 39.0 - [id number] %  Basic metabolic panel  Result Value Ref Range   Sodium 136 135 - 145 mmol/L   Potassium 4.2 3.5 - 5.1 mmol/L   Chloride 104 101 - 111 mmol/L   CO2 23 22 - 32 mmol/L   Glucose, Bld 121 (H) 65 - 99 mg/dL   BUN 22 (H) 6 - 20 mg/dL   Creatinine, Ser 1.24 0.61 - 1.24 mg/dL   Calcium 8.6 (L) 8.9 - 10.3 mg/dL   GFR calc non Af Amer 53 (L) >60 mL/min   GFR calc Af Amer >60 >60 mL/min   Anion gap 9 5 - 15    Discharge Medications:   Allergies as of 02/12/2016      Reactions   Coumadin [warfarin] Other (See Comments)   Can't taste anything   Oxycodone Other (See Comments)   Memory Loss. Patient is "out of it".       Medication List    STOP taking these medications   furosemide 20 MG tablet Commonly known as:   LASIX   naproxen sodium 220 MG tablet Commonly known as:  ANAPROX     TAKE these medications   APPLE CIDER VINEGAR PO Take 2 tablets by mouth daily.   aspirin 325 MG tablet Take 325 mg by mouth daily.   cetirizine 10 MG tablet Commonly known as:  ZYRTEC Take 10 mg by mouth daily as needed for allergies.   CO Q 10 PO Take 1 tablet by mouth daily.   GARLIC PO Take 1 tablet by mouth daily.   lisinopril 5 MG tablet Commonly known as:  PRINIVIL,ZESTRIL Take 1 tablet (5 mg total) by mouth daily.   LUTEIN PO Take 1 tablet by mouth daily.   LYCOPENE PO Take 1 tablet by mouth daily.   metoprolol tartrate 25 MG tablet Commonly known as:  LOPRESSOR Take 1 tablet (25 mg total) by mouth 2 (two) times daily.   multivitamin tablet Take 1 tablet by mouth daily.   pravastatin 40 MG tablet Commonly known as:  PRAVACHOL Take 40 mg by mouth every evening. What changed:  Another medication with the same name was removed. Continue taking this medication, and follow the directions you see here.   SALMON OIL PO Take 1 capsule by mouth daily.  traMADol 50 MG tablet Commonly known as:  ULTRAM Take 1-2 tablets (50-100 mg total) by mouth every 6 (six) hours as needed for moderate pain.   vitamin E 400 UNIT capsule Generic drug:  vitamin E Take 400 Units by mouth daily.       Diagnostic Studies: Dg Shoulder Left Port  Result Date: 02/11/2016 CLINICAL DATA:  Post left shoulder replacement. EXAM: LEFT SHOULDER - 1 VIEW COMPARISON:  None. FINDINGS: Single portable view of the left shoulder demonstrates a total left shoulder arthroplasty. The entire humeral stem is visualized. The shoulder replacement appears located on this single view. Lucency in the subacromial area is compatible with expected postoperative changes. Limited evaluation of the left lung. IMPRESSION: Left shoulder arthroplasty.  No complicating features. Electronically Signed   By: Markus Daft M.D.   On: 02/11/2016 10:36     Disposition: home    Follow-up Information    Polk Minor,STEVEN R, MD. Call in 2 weeks.   Specialty:  Orthopedic Surgery Why:  [id number] Contact information: [address] [place] [address] [phone]            Signed: Augustin Schooling 02/12/2016, 9:28 AM

## 2016-02-12 NOTE — Progress Notes (Signed)
Patient discharged to home with belongings, IVs removed. Left shoulder dressing changed. Prescriptions and AVS given. Teach back performed, all questions answered, patient stable at time of discharge.

## 2016-02-12 NOTE — Evaluation (Signed)
Occupational Therapy Evaluation Patient Details Name: Ricky Lee MRN: [id number] DOB: 1933-10-03 Today's Date: 02/12/2016    History of Present Illness 82 y/o male s/p left reverse total shoulder arthroplasty. Pt  has a past medical history of Arthritis; Cancer; Degenerative lumbar disc; Dyspnea; Hyperlipemia; Hypertension; Myocardial infarction (1984); OA ; Osteoporosis (05/05/2015); and Trigger little finger of left hand. Pt has a past surgical history that includes Rotator cuff repair (Right); Tongue surgery; Leg Surgery; Biceps tendon repair; Cholecystectomy; Cardiac catheterization; right foot surgery; Tonsillectomy; Coronary angioplasty (1984); Total shoulder arthroplasty (01/19/2012); Irrigation and debridement shoulder (02/09/2012); Joint replacement (1999); Joint replacement (2014); Eye surgery (Bilateral); Total knee arthroplasty (Left, 04/17/2014); Skin cancer excision; and Colonoscopy w/ polypectomy.   Clinical Impression   PTA Pt independent in ADL and ambulation. Pt currently mod assist for Bilateral ADL, and min guard for ambulation for safety. Please see performance below. Pt reviewed entire shoulder protocol handout, and practiced exercises as ordered by MD. Pt will benefit from second OT session in the acute care setting for family education, and practice getting dressed before discharge. Pt at adequate level to d/c should OT schedule not allow for second visit and Pt is ready to discharge from medical perspective.     Follow Up Recommendations  Other (comment) (Progress rehab of shoulder as ordered by MD at follow up)    Equipment Recommendations  None recommended by OT    Recommendations for Other Services       Precautions / Restrictions Precautions Precautions: Shoulder;Fall Type of Shoulder Precautions: Active Protocol Shoulder Interventions: Shoulder sling/immobilizer;For comfort (and sleep) Precaution Booklet Issued: Yes (comment) Precaution Comments: OT  shoulder protocol handout reviewed in full Required Braces or Orthoses: Sling Restrictions Weight Bearing Restrictions: Yes LUE Weight Bearing: Non weight bearing Other Position/Activity Restrictions: FF 0-90, Abduction 0-60, ER 0-30, ADL's OK      Mobility Bed Mobility Overal bed mobility: Needs Assistance Bed Mobility: Supine to Sit     Supine to sit: Min assist     General bed mobility comments: min A to bring trunk upright  Transfers Overall transfer level: Needs assistance Equipment used:  (sling) Transfers: Sit to/from Stand Sit to Stand: Min guard;Supervision (for safety at first from bed, then supervision rest of sessi)         General transfer comment: no attempt to push up with LUE    Balance Overall balance assessment: Needs assistance Sitting-balance support: No upper extremity supported;Feet supported Sitting balance-Leahy Scale: Good Sitting balance - Comments: sitting EOB for exercises   Standing balance support: No upper extremity supported;During functional activity Standing balance-Leahy Scale: Fair Standing balance comment: standing at sink, trunk supported by sink edge                            ADL Overall ADL's : Needs assistance/impaired     Grooming: Wash/dry hands;Wash/dry face;Oral care;Supervision/safety;Standing Grooming Details (indicate cue type and reason): sink level ADL Upper Body Bathing: Moderate assistance;Standing (educated on technique to clean under arm)   Lower Body Bathing: Min guard (standing)   Upper Body Dressing : Maximal assistance;Sitting Upper Body Dressing Details (indicate cue type and reason): educated in dressing sequence Lower Body Dressing: Moderate assistance;Sit to/from stand   Toilet Transfer: Supervision/safety;Comfort height toilet;Grab bars   Toileting- Clothing Manipulation and Hygiene: Supervision/safety;Sit to/from stand Toileting - Clothing Manipulation Details (indicate cue type and  reason): hospital gown, able to perform own peri care Tub/ Shower Transfer: Walk-in  shower;Supervision/safety;Ambulation Tub/Shower Transfer Details (indicate cue type and reason): reviewed safety with wet surfaces and having caregiver present for safety Functional mobility during ADLs: Min guard       Vision Vision Assessment?: No apparent visual deficits   Perception     Praxis      Pertinent Vitals/Pain Pain Assessment: 0-10 Pain Score: 4  Pain Location: headache, none from shoulder Pain Descriptors / Indicators: Headache Pain Intervention(s): Monitored during session     Hand Dominance Right   Extremity/Trunk Assessment Upper Extremity Assessment Upper Extremity Assessment: LUE deficits/detail LUE Deficits / Details: typical post-op decreased ROM and strength   Lower Extremity Assessment Lower Extremity Assessment: Overall WFL for tasks assessed   Cervical / Trunk Assessment Cervical / Trunk Assessment: Other exceptions Cervical / Trunk Exceptions: history of back sugeries   Communication Communication Communication: No difficulties   Cognition Arousal/Alertness: Awake/alert Behavior During Therapy: WFL for tasks assessed/performed Overall Cognitive Status: Within Functional Limits for tasks assessed                     General Comments       Exercises Exercises: Shoulder     Shoulder Instructions Shoulder Instructions Donning/doffing shirt without moving shoulder: Maximal assistance;Patient able to independently direct caregiver Method for sponge bathing under operated UE: Supervision/safety Donning/doffing sling/immobilizer: Moderate assistance;Patient able to independently direct caregiver Correct positioning of sling/immobilizer: Modified independent;Patient able to independently direct caregiver ROM for elbow, wrist and digits of operated UE: Independent Sling wearing schedule (on at all times/off for ADL's): Independent Proper positioning of  operated UE when showering: Minimal assistance;Patient able to independently direct caregiver Positioning of UE while sleeping: Modified independent    Home Living Family/patient expects to be discharged to:: Private residence Living Arrangements: Spouse/significant other Available Help at Discharge: Family;Available 24 hours/day Type of Home: House Home Access: Stairs to enter [employer] of Steps: 2 Entrance Stairs-Rails: Right;Left;Can reach both Home Layout: One level     Bathroom Shower/Tub: Occupational psychologist: Handicapped height Bathroom Accessibility: Yes How Accessible: Accessible via walker Home Equipment: [place] - 2 wheels;Cane - single point;Grab bars - toilet;Wheelchair - manual          Prior Functioning/Environment Level of Independence: Independent        Comments: performing housework including baking a cooking        OT Problem List: Decreased strength;Decreased range of motion;Decreased activity tolerance;Impaired balance (sitting and/or standing);Decreased knowledge of precautions;Obesity;Impaired UE functional use   OT Treatment/Interventions: Self-care/ADL training;Therapeutic exercise;Therapeutic activities;Patient/family education;Balance training    OT Goals(Current goals can be found in the care plan section) Acute Rehab OT Goals Patient Stated Goal: to get home OT Goal Formulation: With patient Time For Goal Achievement: 02/19/16 Potential to Achieve Goals: Good  OT Frequency: Min 2X/week   Barriers to D/C:            Co-evaluation              End of Session Equipment Utilized During Treatment: Other (comment) (sling) Nurse Communication: Mobility status  Activity Tolerance: Patient tolerated treatment well Patient left: in chair;with call bell/phone within reach   Time: 0859-0952 OT Time Calculation (min): 53 min Charges:  OT General Charges $OT Visit: 1 Procedure OT Evaluation $OT Eval Moderate  Complexity: 1 Procedure OT Treatments $Self Care/Home Management : 8-22 mins $Therapeutic Exercise: 23-37 mins G-Codes:    Merri Ray [place] 11-Mar-2016, 10:28 AM Hulda Humphrey OTR/L [phone]

## 2016-02-13 ENCOUNTER — Encounter: Payer: Self-pay | Admitting: Family Medicine

## 2016-02-14 ENCOUNTER — Encounter (HOSPITAL_COMMUNITY): Payer: Self-pay | Admitting: Orthopedic Surgery

## 2016-02-14 NOTE — Op Note (Signed)
NAME:  DURRELL, BONFANTE NO.:  [id number]  MEDICAL RECORD NO.:  [id number]  LOCATION:                               FACILITY:  [facility]  PHYSICIAN:  [name]. [name], M.D. DATE OF BIRTH:  [birth year]  DATE OF PROCEDURE:  02/11/2016 DATE OF DISCHARGE:  02/12/2016                              OPERATIVE REPORT   PREOPERATIVE DIAGNOSIS:  Left shoulder rotator cuff insufficiency and arthritis.  POSTOPERATIVE DIAGNOSIS:  Left shoulder rotator cuff insufficiency and arthritis.  PROCEDURE PERFORMED:  Left reverse total shoulder arthroplasty using DePuy Delta Xtend prosthesis.  ATTENDING SURGEON:  [name]. [name], M.D.  ASSISTANT:  Abbott Pao. Dixon, P.A.-C., who scrubbed the entire procedure and necessary for satisfactory completion of surgery.  ANESTHESIA:  General anesthesia was used plus interscalene block.  ESTIMATED BLOOD LOSS:  Minimal.  FLUID REPLACEMENT:  1200 mL crystalloid.  INSTRUMENT COUNTS:  Correct.  COMPLICATIONS:  There were no complications.  ANTIBIOTICS:  Perioperative antibiotics were given.  INDICATIONS:  The patient is an 82 year old male with worsening left shoulder pain secondary to end-stage arthritis and rotator cuff insufficiency.  The patient's pain now is to the point where he is having trouble sleeping and it interferes with all activities of daily living.  The patient desires to have reverse total shoulder arthroplasty to restore function and eliminate pain.  Informed consent obtained.  DESCRIPTION OF SURGERY:  After an adequate level of anesthesia achieved, the patient was positioned in the modified beach-chair position.  Left shoulder was correctly identified, sterilely prepped and draped in usual manner.  Time-out called.  We entered the patient's shoulder using a standard deltopectoral incision started at the coracoid process extending down to the anterior humerus.  Dissection taken down through subcutaneous tissues using the  Bovie.  Cephalic vein identified and taken laterally the deltoid, pectoralis taken medially.  Conjoint tendon identified and retracted medially.  The remnant of the subscapularis was released off the lesser tuberosity and the inferior humeral neck.  The biceps tendon was tenodesed in situ to the pectoralis tendon with a soft tissue tenodesis with 0 Vicryl figure-of-eight x2.  We tagged the subscap remnant to protect the axillary nerve, progressively released the inferior capsule off the humerus, externally rotated the humerus, and then we delivered the humerus out of the wound.  There was no supraspinatus or infraspinatus tendon remaining.  Teres minor was diminutive, but potentially intact posteriorly__________.  We entered the proximal humerus using a 6 mm reamer, reamed up to a size 12 mm reamer, placed a 12 mm intramedullary guide and resected the head 10 degrees retroversion.  Next, we went ahead and removed osteophytes with a rongeur.  We then placed our guide for the metaphyseal resection. Once we had that in position with 10 degrees retroversion selected, we went ahead and milled for the epi-2 left metaphysis.  Once we had that milling completed, we then went ahead and placed the trial stems.  It was a 12 stem with the epi-2 left, set on 0 setting placed in 10 degrees retroversion and impacted with good position.  Retracted the humerus posteriorly.  Did a capsular resection and glenoid labrum removal. Removed the remaining biceps and  rotator cuff tissue.  At this point, we had 360-degree exposure of a completely worn out glenoid.  We placed our central guide pin in the appropriate position, angled a little inferiorly.  We then went ahead and reamed for the metaglene and then drilled our central peg hole and impacted the metaglene in position and then selected a 42 standard glenosphere.  After making sure, we had our axillary nerve out of the way.  We went ahead and seated  the glenosphere, we had good excellent coverage, and we then reduced the shoulder with a 42 +3 poly trial.  We were happy with that soft tissue balancing.  At this point, we went ahead and removed the trial components from the humeral side.  We irrigated the canal fully.  The patient had excellent bone stock __________ adjusted the HA coated press- fit stem, size 12 stem, and epi-2 left, set on the 0 setting and placed in 10 degrees retroversion.  We used impaction grafting with available bone graft from the head and impacted the humeral stem into position. We then selected the real 42 +3 poly and inserted that and impacted it and reduced the shoulder with a nice little pop.  We had good tension with the arm at the patient's side, progressive external rotation, no gapping, and no gapping with an inferior arm pole.  Conjoined tendon nice and tight.  The axillary nerve was checked, and it was free and clear.  At this point, we went ahead and irrigated thoroughly and then closed deltopectoral interval, resected remaining remnant of the subscap.  We then closed the deltopectoral interval with 0 Vicryl suture followed by 2-0 Vicryl for subcutaneous closure and 4-0 Monocryl for skin.  The patient tolerated surgery well.     [name]. [name], M.D.     SRN/MEDQ  D:  02/11/2016  T:  02/12/2016  Job:  [id number]

## 2016-02-29 DIAGNOSIS — Z96612 Presence of left artificial shoulder joint: Secondary | ICD-10-CM | POA: Diagnosis not present

## 2016-03-28 DIAGNOSIS — Z96612 Presence of left artificial shoulder joint: Secondary | ICD-10-CM | POA: Diagnosis not present

## 2016-04-05 DIAGNOSIS — E782 Mixed hyperlipidemia: Secondary | ICD-10-CM | POA: Diagnosis not present

## 2016-04-05 DIAGNOSIS — I2510 Atherosclerotic heart disease of native coronary artery without angina pectoris: Secondary | ICD-10-CM | POA: Diagnosis not present

## 2016-04-05 DIAGNOSIS — I872 Venous insufficiency (chronic) (peripheral): Secondary | ICD-10-CM | POA: Insufficient documentation

## 2016-04-05 DIAGNOSIS — Z6841 Body Mass Index (BMI) 40.0 and over, adult: Secondary | ICD-10-CM | POA: Diagnosis not present

## 2016-04-05 DIAGNOSIS — I10 Essential (primary) hypertension: Secondary | ICD-10-CM | POA: Diagnosis not present

## 2016-06-07 DIAGNOSIS — Z96612 Presence of left artificial shoulder joint: Secondary | ICD-10-CM | POA: Diagnosis not present

## 2016-06-07 DIAGNOSIS — G5602 Carpal tunnel syndrome, left upper limb: Secondary | ICD-10-CM | POA: Diagnosis not present

## 2016-06-07 DIAGNOSIS — Z471 Aftercare following joint replacement surgery: Secondary | ICD-10-CM | POA: Diagnosis not present

## 2016-06-08 ENCOUNTER — Encounter: Payer: Self-pay | Admitting: Family Medicine

## 2016-06-08 ENCOUNTER — Ambulatory Visit (INDEPENDENT_AMBULATORY_CARE_PROVIDER_SITE_OTHER): Payer: Medicare HMO | Admitting: Family Medicine

## 2016-06-08 VITALS — BP 130/72 | HR 82 | Temp 97.4°F | Resp 14 | Wt 278.0 lb

## 2016-06-08 DIAGNOSIS — E782 Mixed hyperlipidemia: Secondary | ICD-10-CM | POA: Diagnosis not present

## 2016-06-08 DIAGNOSIS — I2510 Atherosclerotic heart disease of native coronary artery without angina pectoris: Secondary | ICD-10-CM

## 2016-06-08 DIAGNOSIS — R0602 Shortness of breath: Secondary | ICD-10-CM | POA: Diagnosis not present

## 2016-06-08 DIAGNOSIS — R7301 Impaired fasting glucose: Secondary | ICD-10-CM | POA: Diagnosis not present

## 2016-06-08 DIAGNOSIS — I10 Essential (primary) hypertension: Secondary | ICD-10-CM

## 2016-06-08 DIAGNOSIS — Z5181 Encounter for therapeutic drug level monitoring: Secondary | ICD-10-CM | POA: Diagnosis not present

## 2016-06-08 LAB — CBC WITH DIFFERENTIAL/PLATELET
BASOS PCT: 0 %
Basophils Absolute: 0 cells/uL (ref 0–200)
EOS ABS: 134 {cells}/uL (ref 15–500)
Eosinophils Relative: 2 %
HEMATOCRIT: 45.4 % (ref 38.5–50.0)
Hemoglobin: 14.8 g/dL (ref 13.2–17.1)
Lymphocytes Relative: 32 %
Lymphs Abs: 2144 cells/uL (ref 850–3900)
MCH: 30.7 pg (ref 27.0–33.0)
MCHC: 32.6 g/dL (ref 32.0–36.0)
MCV: 94.2 fL (ref 80.0–100.0)
MONO ABS: 737 {cells}/uL (ref 200–950)
MONOS PCT: 11 %
MPV: 9.6 fL (ref 7.5–12.5)
NEUTROS ABS: 3685 {cells}/uL (ref 1500–7800)
Neutrophils Relative %: 55 %
PLATELETS: 181 10*3/uL (ref 140–400)
RBC: 4.82 MIL/uL (ref 4.20–5.80)
RDW: 13.6 % (ref 11.0–15.0)
WBC: 6.7 10*3/uL (ref 3.8–10.8)

## 2016-06-08 LAB — COMPLETE METABOLIC PANEL WITH GFR
ALT: 15 U/L (ref 9–46)
AST: 22 U/L (ref 10–35)
Albumin: 4 g/dL (ref 3.6–5.1)
Alkaline Phosphatase: 66 U/L (ref 40–115)
BUN: 24 mg/dL (ref 7–25)
CHLORIDE: 103 mmol/L (ref 98–110)
CO2: 29 mmol/L (ref 20–31)
CREATININE: 1.14 mg/dL — AB (ref 0.70–1.11)
Calcium: 9.5 mg/dL (ref 8.6–10.3)
GFR, EST AFRICAN AMERICAN: 69 mL/min (ref >=60)
GFR, Est Non African American: 60 mL/min (ref >=60)
Glucose, Bld: 93 mg/dL (ref 65–99)
Potassium: 4.8 mmol/L (ref 3.5–5.3)
Sodium: 140 mmol/L (ref 135–146)
TOTAL PROTEIN: 6.8 g/dL (ref 6.1–8.1)
Total Bilirubin: 0.9 mg/dL (ref 0.2–1.2)

## 2016-06-08 LAB — LIPID PANEL
CHOL/HDL RATIO: 3.4 ratio (ref <5.0)
Cholesterol: 204 mg/dL — ABNORMAL HIGH (ref <200)
HDL: 60 mg/dL (ref >40)
LDL Cholesterol: 115 mg/dL — ABNORMAL HIGH (ref <100)
Triglycerides: 147 mg/dL (ref <150)
VLDL: 29 mg/dL (ref <30)

## 2016-06-08 MED ORDER — METOPROLOL TARTRATE 25 MG PO TABS: 25.0000 mg | ORAL_TABLET | Freq: Two times a day (BID) | ORAL | 3 refills | Status: DC

## 2016-06-08 MED ORDER — PRAVASTATIN SODIUM 40 MG PO TABS: 40.0000 mg | ORAL_TABLET | Freq: Every evening | ORAL | 3 refills | Status: DC

## 2016-06-08 MED ORDER — LISINOPRIL 5 MG PO TABS: 5.0000 mg | ORAL_TABLET | Freq: Every day | ORAL | 3 refills | Status: DC

## 2016-06-08 NOTE — Assessment & Plan Note (Signed)
Managed by cardiologist; on aspirin; recent exercise stress test; try to limit saturated fats; continue statin; work on weight loss, down to 264 pounds over next 3 months

## 2016-06-08 NOTE — Assessment & Plan Note (Signed)
Continue current management; try the DASH guidelines; work on weight loss

## 2016-06-08 NOTE — Assessment & Plan Note (Signed)
Aim for 264 pounds over the next 3 months

## 2016-06-08 NOTE — Assessment & Plan Note (Signed)
Goal LDL less than 70; check lipids today; healthier eating, weight loss

## 2016-06-08 NOTE — Assessment & Plan Note (Signed)
Work on [employer] loss;try more water instead of sweet tea

## 2016-06-08 NOTE — Patient Instructions (Signed)
Check out the information at familydoctor.org entitled "Nutrition for Weight Loss: What You Need to Know about Fad Diets" Try to lose between 1-2 pounds per week by taking in fewer calories and burning off more calories You can succeed by limiting portions, limiting foods dense in calories and fat, becoming more active, and drinking 8 glasses of water a day (64 ounces) Don't skip meals, especially breakfast, as skipping meals may alter your metabolism Do not use over-the-counter weight loss pills or gimmicks that claim rapid weight loss A healthy BMI (or body mass index) is between 18.5 and 24.9 You can calculate your ideal BMI at the NIH website http://www.nhlbi.nih.gov/health/educational/lose_wt/BMI/bmicalc.htm  

## 2016-06-08 NOTE — Assessment & Plan Note (Signed)
Check SGPT, Cr, K+

## 2016-06-08 NOTE — Progress Notes (Signed)
BP 130/72   Pulse 82   Temp 97.4 F (36.3 C) (Oral)   Resp 14   Wt 278 lb (126.1 kg)   SpO2 99%   BMI 42.27 kg/m    Subjective:    Patient ID: Ricky Lee, male    DOB: [birth year], 83 y.o.   MRN: [id number]  HPI: Ricky Lee is a 83 y.o. male  Chief Complaint  Patient presents with  . Follow-up  . Medication Refill   HPI He is here for f/u with his wife [facility] with very little exertion; just one side of the house to the other; since the surgery (shoulder replacement); 4-6 months, getting progressively worse Lays flat at night, sleeps on right shoulder, rolls over, sleeps well Coughing every once in a while from allergies during the day; no PND Was a smoker, smoked 28 years, 2 ppd, wife thinks he smoked longer He thinks he was told he had emphysema at one time He saw Dr. Nehemiah Massed (cardiologist) and they got him on a treadmill; he said he did "good" Likes pasta, reducing portions; does not want to give up that or cheese Not many eggs Saw Dr. [name] yesterday; thinks he has LEFT carpal tunnel No recent labs Previous H/H mildly low Prediabetes; does drink sweet tea at restaurants but agrees to try water; not a strong fam hx of diabetes  Depression screen [facility] 2/9 06/08/2016 11/15/2015 07/16/2015  Decreased Interest 0 0 0  Down, Depressed, Hopeless 0 0 0  PHQ - 2 Score 0 0 0    Relevant past medical, surgical, family and social history reviewed Past Medical History:  Diagnosis Date  . Arthritis   . Cancer ([place])    oral cancer in past, skin cancer  . Complication of anesthesia    last surgery 02/04/16- confusion "lost a week"  . Coronary artery disease   . Degenerative lumbar disc   . Dyspnea    with exertion  . Hyperlipemia   . Hypertension   . Myocardial infarction 1984  . OA (osteoarthritis)    ankle/foot/knee/shoulder  . Osteoporosis 05/05/2015  . Trigger little finger of left hand   . Vitamin D deficiency 05/05/2015   Past Surgical History:  Procedure  Laterality Date  . BICEPS TENDON REPAIR     left  . CARDIAC CATHETERIZATION    . CHOLECYSTECTOMY    . COLONOSCOPY W/ POLYPECTOMY    . CORONARY ANGIOPLASTY  1984  . EYE SURGERY Bilateral    cataracts  . IRRIGATION AND DEBRIDEMENT SHOULDER  02/09/2012   Procedure: IRRIGATION AND DEBRIDEMENT SHOULDER;  Surgeon: Augustin Schooling, MD;  Location: [facility];  Service: Orthopedics;  Laterality: Right;  Right Shoulder I&D with Poly Exchange, Removal of 42 +9 Standard Poly spacer  . JOINT REPLACEMENT  1999   right knee  . JOINT REPLACEMENT  2014   right shoulder  . LEG SURGERY     right and leg  . REVERSE SHOULDER ARTHROPLASTY Left 02/11/2016   Procedure: LEFT REVERSE TOTAL SHOULDER ARTHROPLASTY;  Surgeon: Netta Cedars, MD;  Location: [facility];  Service: Orthopedics;  Laterality: Left;  . REVERSE TOTAL SHOULDER ARTHROPLASTY Left 02/11/2016   Dr. [name]  . right foot surgery     bone removed  . ROTATOR CUFF REPAIR Right    right  . SKIN CANCER EXCISION     nose  . TONGUE SURGERY     removal of cancer  . TONSILLECTOMY    . TOTAL KNEE ARTHROPLASTY Left 04/17/2014  Procedure: LEFT TOTAL KNEE ARTHROPLASTY;  Surgeon: Augustin Schooling, MD;  Location: [facility];  Service: Orthopedics;  Laterality: Left;  . TOTAL SHOULDER ARTHROPLASTY  01/19/2012   Procedure: TOTAL SHOULDER ARTHROPLASTY;  Surgeon: Augustin Schooling, MD;  Location: [facility];  Service: Orthopedics;  Laterality: Right;  REVERSE TOTAL SHOULDER   Family History  Problem Relation Age of Onset  . Alzheimer's disease Father   . Hypertension Brother   . Heart disease Brother   . Cancer Maternal Grandmother     male  . Cancer Paternal Grandmother   . COPD Neg Hx   . Diabetes Neg Hx   . Stroke Neg Hx    Social History  Substance Use Topics  . Smoking status: Former Smoker    Packs/day: 2.00    Years: 29.00    Types: Cigarettes    Quit date: 04/07/1982  . Smokeless tobacco: Never Used  . Alcohol use Yes     Comment: occasional- beer    Interim medical history since last visit reviewed. Allergies and medications reviewed  Review of Systems  Constitutional: Negative for unexpected weight change.  Cardiovascular: Negative for chest pain.   Per HPI unless specifically indicated above     Objective:    BP 130/72   Pulse 82   Temp 97.4 F (36.3 C) (Oral)   Resp 14   Wt 278 lb (126.1 kg)   SpO2 99%   BMI 42.27 kg/m   Wt Readings from Last 3 Encounters:  06/08/16 278 lb (126.1 kg)  02/11/16 285 lb (129.3 kg)  02/04/16 285 lb 0.9 oz (129.3 kg)    Physical Exam  Constitutional: He appears well-developed and well-nourished.  Morbidly obese; no distress  Eyes: EOM are normal. No scleral icterus.  Neck: No JVD present.  Cardiovascular: Normal rate and regular rhythm.   Pulmonary/Chest: Effort normal and breath sounds normal. He has no wheezes. He has no rales.  Abdominal: Soft. He exhibits no distension.  Musculoskeletal: He exhibits edema (trace nonpitting edema).  Surgical scars over knees  Neurological: He is alert.  Skin: Skin is warm and dry.  Psychiatric: He has a normal mood and affect. His behavior is normal. Judgment and thought content normal.       Assessment & Plan:   Problem List Items Addressed This Visit      Cardiovascular and Mediastinum   Essential hypertension, benign (Chronic)    Continue current management; try the DASH guidelines; work on weight loss      Relevant Medications   lisinopril (PRINIVIL,ZESTRIL) 5 MG tablet   pravastatin (PRAVACHOL) 40 MG tablet   metoprolol tartrate (LOPRESSOR) 25 MG tablet   Other Relevant Orders   TSH (Completed)   Coronary artery disease (Chronic)    Managed by cardiologist; on aspirin; recent exercise stress test; try to limit saturated fats; continue statin; work on weight loss, down to 264 pounds over next 3 months      Relevant Medications   lisinopril (PRINIVIL,ZESTRIL) 5 MG tablet   pravastatin (PRAVACHOL) 40 MG tablet   metoprolol  tartrate (LOPRESSOR) 25 MG tablet   Other Relevant Orders   CBC with Differential/Platelet (Completed)   Lipid panel (Completed)     Endocrine   IFG (impaired fasting glucose) (Chronic)    Work on [employer] loss;try more water instead of sweet tea      Relevant Orders   Hemoglobin A1c (Completed)     Other   Morbid obesity (HCC) (Chronic)    Aim for  264 pounds over the next 3 months      Relevant Orders   TSH (Completed)   Ambulatory referral to Pulmonology   Medication monitoring encounter    Check SGPT, Cr, K+      Relevant Orders   COMPLETE METABOLIC PANEL WITH GFR (Completed)   Hyperlipidemia (Chronic)    Goal LDL less than 70; check lipids today; healthier eating, weight loss      Relevant Medications   lisinopril (PRINIVIL,ZESTRIL) 5 MG tablet   pravastatin (PRAVACHOL) 40 MG tablet   metoprolol tartrate (LOPRESSOR) 25 MG tablet   Other Relevant Orders   Lipid panel (Completed)    Other Visit Diagnoses    Shortness of breath    -  Primary   Relevant Orders   Spirometry with Graph   DG Chest 2 View   Ambulatory referral to Pulmonology      Follow up plan: Return in about 3 months (around 09/07/2016) for follow-up.  An after-visit summary was printed and given to the patient at [facility].  Please see the patient instructions which may contain other information and recommendations beyond what is mentioned above in the assessment and plan.  Meds ordered this encounter  Medications  . cetirizine (ZYRTEC) 5 MG tablet    Sig: Take 5 mg by mouth daily.  Marland Kitchen lisinopril (PRINIVIL,ZESTRIL) 5 MG tablet    Sig: Take 1 tablet (5 mg total) by mouth daily.    Dispense:  90 tablet    Refill:  3  . pravastatin (PRAVACHOL) 40 MG tablet    Sig: Take 1 tablet (40 mg total) by mouth every evening.    Dispense:  90 tablet    Refill:  3  . metoprolol tartrate (LOPRESSOR) 25 MG tablet    Sig: Take 1 tablet (25 mg total) by mouth 2 (two) times daily.    Dispense:  180 tablet     Refill:  3    Orders Placed This Encounter  Procedures  . DG Chest 2 View  . CBC with Differential/Platelet  . COMPLETE METABOLIC PANEL WITH GFR  . Hemoglobin A1c  . Lipid panel  . TSH  . Ambulatory referral to Pulmonology  . Spirometry with Graph

## 2016-06-09 ENCOUNTER — Other Ambulatory Visit: Payer: Self-pay | Admitting: Family Medicine

## 2016-06-09 DIAGNOSIS — I2510 Atherosclerotic heart disease of native coronary artery without angina pectoris: Secondary | ICD-10-CM

## 2016-06-09 DIAGNOSIS — I252 Old myocardial infarction: Secondary | ICD-10-CM

## 2016-06-09 DIAGNOSIS — Z5181 Encounter for therapeutic drug level monitoring: Secondary | ICD-10-CM

## 2016-06-09 LAB — HEMOGLOBIN A1C
Hgb A1c MFr Bld: 5.4 % (ref <5.7)
MEAN PLASMA GLUCOSE: 108 mg/dL

## 2016-06-09 LAB — TSH: TSH: 2.82 m[IU]/L (ref 0.40–4.50)

## 2016-06-09 MED ORDER — PRAVASTATIN SODIUM 80 MG PO TABS: 80.0000 mg | ORAL_TABLET | Freq: Every day | ORAL | 3 refills | Status: DC

## 2016-06-09 NOTE — Progress Notes (Signed)
Orders for fasting labs 6 weeks after increase in statin

## 2016-06-15 ENCOUNTER — Encounter: Payer: Self-pay | Admitting: Family Medicine

## 2016-06-16 ENCOUNTER — Telehealth: Payer: Self-pay

## 2016-06-16 NOTE — Telephone Encounter (Signed)
Clarify with the pharmacist that pt pravastatin has increase to 80 mg. Discontinue the  Old RX of Pravastatin 40 mg.

## 2016-06-22 DIAGNOSIS — R0602 Shortness of breath: Secondary | ICD-10-CM | POA: Diagnosis not present

## 2016-06-22 DIAGNOSIS — Z6841 Body Mass Index (BMI) 40.0 and over, adult: Secondary | ICD-10-CM | POA: Diagnosis not present

## 2016-06-22 DIAGNOSIS — J449 Chronic obstructive pulmonary disease, unspecified: Secondary | ICD-10-CM | POA: Diagnosis not present

## 2016-06-22 DIAGNOSIS — G479 Sleep disorder, unspecified: Secondary | ICD-10-CM | POA: Diagnosis not present

## 2016-06-26 DIAGNOSIS — G5622 Lesion of ulnar nerve, left upper limb: Secondary | ICD-10-CM | POA: Diagnosis not present

## 2016-06-26 DIAGNOSIS — Z96612 Presence of left artificial shoulder joint: Secondary | ICD-10-CM | POA: Diagnosis not present

## 2016-06-26 DIAGNOSIS — Z471 Aftercare following joint replacement surgery: Secondary | ICD-10-CM | POA: Diagnosis not present

## 2016-07-04 DIAGNOSIS — G5622 Lesion of ulnar nerve, left upper limb: Secondary | ICD-10-CM | POA: Diagnosis not present

## 2016-07-04 DIAGNOSIS — Z96612 Presence of left artificial shoulder joint: Secondary | ICD-10-CM | POA: Diagnosis not present

## 2016-07-04 DIAGNOSIS — Z471 Aftercare following joint replacement surgery: Secondary | ICD-10-CM | POA: Diagnosis not present

## 2016-07-04 DIAGNOSIS — G5602 Carpal tunnel syndrome, left upper limb: Secondary | ICD-10-CM | POA: Diagnosis not present

## 2016-07-21 DIAGNOSIS — J449 Chronic obstructive pulmonary disease, unspecified: Secondary | ICD-10-CM | POA: Diagnosis not present

## 2016-08-10 DIAGNOSIS — Z6841 Body Mass Index (BMI) 40.0 and over, adult: Secondary | ICD-10-CM | POA: Diagnosis not present

## 2016-08-10 DIAGNOSIS — R0609 Other forms of dyspnea: Secondary | ICD-10-CM | POA: Diagnosis not present

## 2016-08-10 DIAGNOSIS — J449 Chronic obstructive pulmonary disease, unspecified: Secondary | ICD-10-CM | POA: Diagnosis not present

## 2016-08-15 DIAGNOSIS — Z85810 Personal history of malignant neoplasm of tongue: Secondary | ICD-10-CM | POA: Diagnosis not present

## 2016-08-15 DIAGNOSIS — C022 Malignant neoplasm of ventral surface of tongue: Secondary | ICD-10-CM | POA: Diagnosis not present

## 2016-08-15 DIAGNOSIS — Z08 Encounter for follow-up examination after completed treatment for malignant neoplasm: Secondary | ICD-10-CM | POA: Diagnosis not present

## 2016-08-18 ENCOUNTER — Telehealth: Payer: Self-pay

## 2016-08-18 NOTE — Telephone Encounter (Signed)
Patient wife notified.

## 2016-08-18 NOTE — Telephone Encounter (Signed)
His age alone should be sufficient Nothing on problem list suggests inability to serve jury duty

## 2016-08-18 NOTE — Telephone Encounter (Signed)
Patient's wife called received letter for jury duty.  Wants to see about getting letter because cannot go due to health.

## 2016-09-07 ENCOUNTER — Encounter: Payer: Self-pay | Admitting: Family Medicine

## 2016-09-07 ENCOUNTER — Other Ambulatory Visit: Payer: Self-pay

## 2016-09-07 ENCOUNTER — Ambulatory Visit (INDEPENDENT_AMBULATORY_CARE_PROVIDER_SITE_OTHER): Payer: Medicare HMO | Admitting: Family Medicine

## 2016-09-07 DIAGNOSIS — I10 Essential (primary) hypertension: Secondary | ICD-10-CM | POA: Diagnosis not present

## 2016-09-07 DIAGNOSIS — E559 Vitamin D deficiency, unspecified: Secondary | ICD-10-CM

## 2016-09-07 DIAGNOSIS — E782 Mixed hyperlipidemia: Secondary | ICD-10-CM

## 2016-09-07 DIAGNOSIS — Z5181 Encounter for therapeutic drug level monitoring: Secondary | ICD-10-CM | POA: Diagnosis not present

## 2016-09-07 DIAGNOSIS — I2510 Atherosclerotic heart disease of native coronary artery without angina pectoris: Secondary | ICD-10-CM | POA: Diagnosis not present

## 2016-09-07 DIAGNOSIS — R7301 Impaired fasting glucose: Secondary | ICD-10-CM | POA: Diagnosis not present

## 2016-09-07 MED ORDER — PRAVASTATIN SODIUM 40 MG PO TABS: 40.0000 mg | ORAL_TABLET | Freq: Every day | ORAL | 3 refills | Status: DC

## 2016-09-07 NOTE — Patient Instructions (Signed)
Please do contact you orthopaedist about your arm symptoms I am here to help if you want to change your diet or lose weight Return for fasting labs only on or after December 08, 2016

## 2016-09-07 NOTE — Progress Notes (Signed)
BP 122/88   Pulse 93   Temp 97.7 F (36.5 C)   Resp 16   Wt 275 lb 12.8 oz (125.1 kg)   SpO2 96%   BMI 41.94 kg/m    Subjective:    Patient ID: Ricky Lee, male    DOB: 03-Aug-1933, 83 y.o.   MRN: [id number]  HPI: Ricky Lee is a 83 y.o. male  Chief Complaint  Patient presents with  . Follow-up   HPI Patient is here for f/u No medical excitement since last visit Saw Dr. Raul Del; breathing has been okay even with the hot and humid weather  HTN; well-controlled today; taking two medicines; no real dry mouth except when he sleeps with his mouth open; does not have sleep apnea; they did the test and told him he needs a mask, but he won't; he says again that he doesn't have sleep apnea when questioned again; he won't get any further testing  On aspirin and no bleeding, never have  High cholesterol; runs in the family; he eats what he wants; 83 years old and not going to change I wanted to increase his pravastatin at last check, but he says that he did not want to increase the dose, so stayed at 40 mg daily; he won't go up; he refuses; just taking 1/2 of the 80 and then will go back to 40 mg; he has always been good about governing his cholesterol; he likes his cheese and his bacon and sausage and pork products he says  Lab Results  Component Value Date   CHOL 204 (H) 06/08/2016   HDL 60 06/08/2016   LDLCALC 115 (H) 06/08/2016   LDLDIRECT 124 11/15/2015   TRIG 147 06/08/2016   CHOLHDL 3.4 06/08/2016   He had his left shoulder put in; fingers are cold on the left; DR. [name] did the test for nerves in the left upper extremity; he is having hard time holding cup of coffee; they told him he has arthritis in his wrist; they were thinking about carpal tunnel; he's taking an NSAID about six pills total per week; he'll call them with his symptoms  Depression screen [facility] 2/9 09/07/2016 06/08/2016 11/15/2015 07/16/2015  Decreased Interest 0 0 0 0  Down, Depressed, Hopeless 0 0  0 0  PHQ - 2 Score 0 0 0 0   Relevant past medical, surgical, family and social history reviewed Past Medical History:  Diagnosis Date  . Arthritis   . Cancer ([place])    oral cancer in past, skin cancer  . Complication of anesthesia    last surgery 02/04/16- confusion "lost a week"  . Coronary artery disease   . Degenerative lumbar disc   . Dyspnea    with exertion  . Hyperlipemia   . Hypertension   . Myocardial infarction ([place]) 1984  . OA (osteoarthritis)    ankle/foot/knee/shoulder  . Osteoporosis 05/05/2015  . Trigger little finger of left hand   . Vitamin D deficiency 05/05/2015   Past Surgical History:  Procedure Laterality Date  . BICEPS TENDON REPAIR     left  . CARDIAC CATHETERIZATION    . CHOLECYSTECTOMY    . COLONOSCOPY W/ POLYPECTOMY    . CORONARY ANGIOPLASTY  1984  . EYE SURGERY Bilateral    cataracts  . IRRIGATION AND DEBRIDEMENT SHOULDER  02/09/2012   Procedure: IRRIGATION AND DEBRIDEMENT SHOULDER;  Surgeon: Augustin Schooling, MD;  Location: [place];  Service: Orthopedics;  Laterality: Right;  Right Shoulder I&D with  Poly Exchange, Removal of 42 +9 Standard Poly spacer  . JOINT REPLACEMENT  1999   right knee  . JOINT REPLACEMENT  2014   right shoulder  . LEG SURGERY     right and leg  . REVERSE SHOULDER ARTHROPLASTY Left 02/11/2016   Procedure: LEFT REVERSE TOTAL SHOULDER ARTHROPLASTY;  Surgeon: Netta Cedars, MD;  Location: [place];  Service: Orthopedics;  Laterality: Left;  . REVERSE TOTAL SHOULDER ARTHROPLASTY Left 02/11/2016   Dr. [name]  . right foot surgery     bone removed  . ROTATOR CUFF REPAIR Right    right  . SKIN CANCER EXCISION     nose  . TONGUE SURGERY     removal of cancer  . TONSILLECTOMY    . TOTAL KNEE ARTHROPLASTY Left 04/17/2014   Procedure: LEFT TOTAL KNEE ARTHROPLASTY;  Surgeon: Augustin Schooling, MD;  Location: [place];  Service: Orthopedics;  Laterality: Left;  . TOTAL SHOULDER ARTHROPLASTY  01/19/2012   Procedure: TOTAL SHOULDER  ARTHROPLASTY;  Surgeon: Augustin Schooling, MD;  Location: [place];  Service: Orthopedics;  Laterality: Right;  REVERSE TOTAL SHOULDER   Family History  Problem Relation Age of Onset  . Alzheimer's disease Father   . Hypertension Brother   . Heart disease Brother   . Cancer Maternal Grandmother        male  . Cancer Paternal Grandmother   . COPD Neg Hx   . Diabetes Neg Hx   . Stroke Neg Hx    Social History   Social History  . Marital status: Married    Spouse name: N/A  . Number of children: N/A  . Years of education: N/A   Occupational History  . Not on file.   Social History Main Topics  . Smoking status: Former Smoker    Packs/day: 2.00    Years: 29.00    Types: Cigarettes    Quit date: 04/07/1982  . Smokeless tobacco: Never Used  . Alcohol use Yes     Comment: occasional- beer  . Drug use: No  . Sexual activity: Yes   Other Topics Concern  . Not on file   Social History Narrative  . No narrative on file    Interim medical history since last visit reviewed. Allergies and medications reviewed  Review of Systems  Cardiovascular: Negative for chest pain.  Musculoskeletal: Positive for arthralgias.   Per HPI unless specifically indicated above     Objective:    BP 122/88   Pulse 93   Temp 97.7 F (36.5 C)   Resp 16   Wt 275 lb 12.8 oz (125.1 kg)   SpO2 96%   BMI 41.94 kg/m   Wt Readings from Last 3 Encounters:  09/07/16 275 lb 12.8 oz (125.1 kg)  06/08/16 278 lb (126.1 kg)  02/11/16 285 lb (129.3 kg)    Physical Exam  Constitutional: He appears well-developed and well-nourished.  Morbidly obese; no distress; weight down 9+ pounds over last 7 months  Eyes: EOM are normal. No scleral icterus.  Neck: No JVD present.  Cardiovascular: Normal rate and regular rhythm.   Pulses:      Radial pulses are 1+ on the left side.  Left upper extremity warm and well-perfused  Pulmonary/Chest: Effort normal and breath sounds normal. He has no wheezes. He has no  rales.  Abdominal: Soft. He exhibits no distension.  Musculoskeletal: He exhibits edema (trace nonpitting edema).  Surgical scars over knees; RIGHT pinky finger does not flex; right hand  grip 5/5, intrinsics 5/5; abduction and adduction of arms 5/5 bilaterally; surgical scar over LEFT elbow, proximal forearm  Neurological: He is alert.  Skin: Skin is warm and dry.  Psychiatric: He has a normal mood and affect. His behavior is normal. Judgment and thought content normal. His mood appears not anxious. He does not exhibit a depressed mood.    Results for orders placed or performed in visit on 06/08/16  CBC with Differential/Platelet  Result Value Ref Range   WBC 6.7 3.8 - 10.8 K/uL   RBC 4.82 4.20 - 5.80 MIL/uL   Hemoglobin 14.8 13.2 - 17.1 g/dL   HCT 45.4 38.5 - 50.0 %   MCV 94.2 80.0 - 100.0 fL   MCH 30.7 27.0 - 33.0 pg   MCHC 32.6 32.0 - 36.0 g/dL   RDW 13.6 11.0 - 15.0 %   Platelets 181 140 - 400 K/uL   MPV 9.6 7.5 - 12.5 fL   Neutro Abs 3,685 1,500 - 7,800 cells/uL   Lymphs Abs 2,144 850 - 3,900 cells/uL   Monocytes Absolute 737 200 - 950 cells/uL   Eosinophils Absolute 134 15 - 500 cells/uL   Basophils Absolute 0 0 - 200 cells/uL   Neutrophils Relative % 55 %   Lymphocytes Relative 32 %   Monocytes Relative 11 %   Eosinophils Relative 2 %   Basophils Relative 0 %   Smear Review Criteria for review not met   COMPLETE METABOLIC PANEL WITH GFR  Result Value Ref Range   Sodium 140 135 - 146 mmol/L   Potassium 4.8 3.5 - 5.3 mmol/L   Chloride 103 98 - 110 mmol/L   CO2 29 20 - 31 mmol/L   Glucose, Bld 93 65 - 99 mg/dL   BUN 24 7 - 25 mg/dL   Creat 1.14 (H) 0.70 - 1.11 mg/dL   Total Bilirubin 0.9 0.2 - 1.2 mg/dL   Alkaline Phosphatase 66 40 - 115 U/L   AST 22 10 - 35 U/L   ALT 15 9 - 46 U/L   Total Protein 6.8 6.1 - 8.1 g/dL   Albumin 4.0 3.6 - 5.1 g/dL   Calcium 9.5 8.6 - 10.3 mg/dL   GFR, Est African American 69 >=60 mL/min   GFR, Est Non African American 60 >=60 mL/min   Hemoglobin A1c  Result Value Ref Range   Hgb A1c MFr Bld 5.4 <5.7 %   Mean Plasma Glucose 108 mg/dL  Lipid panel  Result Value Ref Range   Cholesterol 204 (H) <200 mg/dL   Triglycerides 147 <150 mg/dL   HDL 60 >40 mg/dL   Total CHOL/HDL Ratio 3.4 <5.0 Ratio   VLDL 29 <30 mg/dL   LDL Cholesterol 115 (H) <100 mg/dL  TSH  Result Value Ref Range   TSH 2.82 0.40 - 4.50 mIU/L      Assessment & Plan:   Problem List Items Addressed This Visit      Cardiovascular and Mediastinum   Essential hypertension, benign (Chronic)    Well-controlled; continue medicines      Relevant Medications   pravastatin (PRAVACHOL) 40 MG tablet   Coronary artery disease (Chronic)    No chest pain; on aspirin; Dr. Nehemiah Massed is cardiologist      Relevant Medications   pravastatin (PRAVACHOL) 40 MG tablet     Endocrine   IFG (impaired fasting glucose) (Chronic)    He is not going to change his diet; check glucose fasting and A1c in October  Relevant Orders   Hemoglobin A1c     Other   Vitamin D deficiency    Getting sun exposure      Morbid obesity ([place]) (Chronic)    Encouragement given to lose weight; unlikely to change, not motivated to change at this time      Medication monitoring encounter    On ACE-I, monitor Cr and K+, on statin, monitor SGPT      Relevant Orders   COMPLETE METABOLIC PANEL WITH GFR   Hyperlipidemia (Chronic)    Patient desires to stay at 40 mg daily; dietary adjustment unlikely      Relevant Medications   pravastatin (PRAVACHOL) 40 MG tablet   Other Relevant Orders   Lipid panel       Follow up plan: Return in about 6 months (around 03/10/2017) for follow-up visit with Dr. Sanda Klein.  An after-visit summary was printed and given to the patient at [place].  Please see the patient instructions which may contain other information and recommendations beyond what is mentioned above in the assessment and plan.  Meds ordered this encounter  Medications  .  DISCONTD: pravastatin (PRAVACHOL) 40 MG tablet    Sig: Take 40 mg by mouth daily.  Marland Kitchen albuterol (PROVENTIL HFA;VENTOLIN HFA) 108 (90 Base) MCG/ACT inhaler    Sig: Inhale into the lungs.  . pravastatin (PRAVACHOL) 40 MG tablet    Sig: Take 1 tablet (40 mg total) by mouth at bedtime.    Dispense:  90 tablet    Refill:  3    Going back to the 40 mg strength per pt preference    Orders Placed This Encounter  Procedures  . COMPLETE METABOLIC PANEL WITH GFR  . Lipid panel  . Hemoglobin A1c

## 2016-09-07 NOTE — Assessment & Plan Note (Signed)
He is not going to change his diet; check glucose fasting and A1c in October

## 2016-09-07 NOTE — Assessment & Plan Note (Signed)
Getting sun exposure

## 2016-09-07 NOTE — Assessment & Plan Note (Signed)
Patient desires to stay at 40 mg daily; dietary adjustment unlikely

## 2016-09-07 NOTE — Assessment & Plan Note (Signed)
On ACE-I, monitor Cr and K+, on statin, monitor SGPT

## 2016-09-07 NOTE — Assessment & Plan Note (Signed)
No chest pain; on aspirin; Dr. Nehemiah Massed is cardiologist

## 2016-09-07 NOTE — Assessment & Plan Note (Signed)
Encouragement given to lose weight; unlikely to change, not motivated to change at this time

## 2016-09-07 NOTE — Assessment & Plan Note (Signed)
Well-controlled; continue medicines

## 2016-10-17 DIAGNOSIS — I10 Essential (primary) hypertension: Secondary | ICD-10-CM | POA: Diagnosis not present

## 2016-10-17 DIAGNOSIS — I2510 Atherosclerotic heart disease of native coronary artery without angina pectoris: Secondary | ICD-10-CM | POA: Diagnosis not present

## 2016-10-17 DIAGNOSIS — E782 Mixed hyperlipidemia: Secondary | ICD-10-CM | POA: Diagnosis not present

## 2016-10-17 DIAGNOSIS — R600 Localized edema: Secondary | ICD-10-CM | POA: Diagnosis not present

## 2016-10-17 DIAGNOSIS — Z6841 Body Mass Index (BMI) 40.0 and over, adult: Secondary | ICD-10-CM | POA: Diagnosis not present

## 2016-11-16 DIAGNOSIS — Z6841 Body Mass Index (BMI) 40.0 and over, adult: Secondary | ICD-10-CM | POA: Diagnosis not present

## 2016-11-16 DIAGNOSIS — R0609 Other forms of dyspnea: Secondary | ICD-10-CM | POA: Diagnosis not present

## 2016-11-16 DIAGNOSIS — J449 Chronic obstructive pulmonary disease, unspecified: Secondary | ICD-10-CM | POA: Diagnosis not present

## 2016-11-16 DIAGNOSIS — R0602 Shortness of breath: Secondary | ICD-10-CM | POA: Diagnosis not present

## 2016-11-30 DIAGNOSIS — R69 Illness, unspecified: Secondary | ICD-10-CM | POA: Diagnosis not present

## 2017-03-09 DIAGNOSIS — Z6841 Body Mass Index (BMI) 40.0 and over, adult: Secondary | ICD-10-CM | POA: Diagnosis not present

## 2017-03-09 DIAGNOSIS — B351 Tinea unguium: Secondary | ICD-10-CM | POA: Diagnosis not present

## 2017-03-09 DIAGNOSIS — M79675 Pain in left toe(s): Secondary | ICD-10-CM | POA: Diagnosis not present

## 2017-03-09 DIAGNOSIS — L600 Ingrowing nail: Secondary | ICD-10-CM | POA: Diagnosis not present

## 2017-03-09 DIAGNOSIS — M79674 Pain in right toe(s): Secondary | ICD-10-CM | POA: Diagnosis not present

## 2017-03-13 ENCOUNTER — Ambulatory Visit (INDEPENDENT_AMBULATORY_CARE_PROVIDER_SITE_OTHER): Payer: Medicare HMO | Admitting: Family Medicine

## 2017-03-13 ENCOUNTER — Other Ambulatory Visit: Payer: Self-pay

## 2017-03-13 ENCOUNTER — Encounter: Payer: Self-pay | Admitting: Family Medicine

## 2017-03-13 DIAGNOSIS — I2510 Atherosclerotic heart disease of native coronary artery without angina pectoris: Secondary | ICD-10-CM | POA: Diagnosis not present

## 2017-03-13 DIAGNOSIS — I10 Essential (primary) hypertension: Secondary | ICD-10-CM | POA: Diagnosis not present

## 2017-03-13 DIAGNOSIS — R7301 Impaired fasting glucose: Secondary | ICD-10-CM

## 2017-03-13 DIAGNOSIS — R600 Localized edema: Secondary | ICD-10-CM

## 2017-03-13 DIAGNOSIS — Z5181 Encounter for therapeutic drug level monitoring: Secondary | ICD-10-CM

## 2017-03-13 DIAGNOSIS — M810 Age-related osteoporosis without current pathological fracture: Secondary | ICD-10-CM

## 2017-03-13 DIAGNOSIS — E559 Vitamin D deficiency, unspecified: Secondary | ICD-10-CM

## 2017-03-13 MED ORDER — METOPROLOL TARTRATE 25 MG PO TABS: 25.0000 mg | ORAL_TABLET | Freq: Two times a day (BID) | ORAL | 3 refills | Status: DC

## 2017-03-13 MED ORDER — LISINOPRIL 5 MG PO TABS: 5.0000 mg | ORAL_TABLET | Freq: Every day | ORAL | 3 refills | Status: DC

## 2017-03-13 MED ORDER — ALBUTEROL SULFATE HFA 108 (90 BASE) MCG/ACT IN AERS: 2.0000 | INHALATION_SPRAY | RESPIRATORY_TRACT | 1 refills | Status: DC | PRN

## 2017-03-13 MED ORDER — PRAVASTATIN SODIUM 40 MG PO TABS: 40.0000 mg | ORAL_TABLET | Freq: Every day | ORAL | 3 refills | Status: DC

## 2017-03-13 NOTE — Patient Instructions (Addendum)
increase your water intake, try to keep urine very pale yellow Try the compression stockings again Stand up and hold something until steady  Check out the information at familydoctor.org entitled "Nutrition for Weight Loss: What You Need to Know about Fad Diets" Try to lose between 1-2 pounds per week by taking in fewer calories and burning off more calories You can succeed by limiting portions, limiting foods dense in calories and fat, becoming more active, and drinking 8 glasses of water a day (64 ounces) Don't skip meals, especially breakfast, as skipping meals may alter your metabolism Do not use over-the-counter weight loss pills or gimmicks that claim rapid weight loss A healthy BMI (or body mass index) is between 18.5 and 24.9 You can calculate your ideal BMI at the NIH website ClubMonetize.fr  Try to limit saturated fats in your diet (bologna, hot dogs, barbeque, cheeseburgers, hamburgers, steak, bacon, sausage, cheese, etc.) and get more fresh fruits, vegetables, and whole grains  Let's get labs today If you have not heard anything from my staff in a week about any orders/referrals/studies from today, please contact us here to follow-up [phone]

## 2017-03-13 NOTE — Assessment & Plan Note (Signed)
Seeing cardiologist next month; on aspirin, beta-blocker, statin; no chest pain

## 2017-03-13 NOTE — Assessment & Plan Note (Signed)
He is not likely to change

## 2017-03-13 NOTE — Assessment & Plan Note (Signed)
Check liver and kidneys 

## 2017-03-13 NOTE — Assessment & Plan Note (Signed)
Try stockings and show to cardiologist

## 2017-03-13 NOTE — Assessment & Plan Note (Signed)
Check level and supplement if needed 

## 2017-03-13 NOTE — Assessment & Plan Note (Signed)
Check A1c and fasting glucose today; he is obese; he is not likely to change his diet

## 2017-03-13 NOTE — Progress Notes (Signed)
BP 110/80 (BP Location: Left Arm, Patient Position: Sitting, Cuff Size: Large)   Pulse 94   Temp 98 F (36.7 C) (Oral)   Ht _0  (1.727 m)   Wt 274 lb 1.6 oz (124.3 kg)   SpO2 95%   BMI 41.68 kg/m    Subjective:    Patient ID: Ricky Lee, male    DOB: [birth year], 84 y.o.   MRN: [id number]  HPI: Ricky Lee is a 84 y.o. male  Chief Complaint  Patient presents with  . Follow-up    HPI HTN; on medicines; likes his salt; he does not think I can change him; no decongestants; checks BP at home; always "good", in the ballpark 120/80  When he stands up, feels like his equilibrium is off for just a second; not a good water drinker at all; he drinks two cups of coffee in the morning, maybe tea in the afternoon, maybe water or other liquid at dinner; has used compression stockings when he had his knee surgeries; not lately  Coronary artery disease; no chest pain; sees Dr. Nehemiah Massed is his cardiologist; fasting today  Hx of vit D deficiency; energy level is pretty good; he used to take the prescription strength  Osteoporosis; he does not think this is right; does not recall any one ever telling him he has osteoporosis; hee declines DEXA  Prediabetes; likes his sweets; straight black coffee; wakes up early in the morning with dry mouth, only time; he snores; I asked if he has sleep apnea; he says he doesn't even want to talk about sleep apnea; he does not want to use the machine  Morbid obesity; he says he just has to quit eating; he is 36 and bull headed  Depression screen [facility] 2/9 03/13/2017 09/07/2016 06/08/2016 11/15/2015 07/16/2015  Decreased Interest 0 0 0 0 0  Down, Depressed, Hopeless 0 0 0 0 0  PHQ - 2 Score 0 0 0 0 0    Relevant past medical, surgical, family and social history reviewed Past Medical History:  Diagnosis Date  . Arthritis   . Cancer ([place])    oral cancer in past, skin cancer  . Complication of anesthesia    last surgery 02/04/16- confusion "lost a  week"  . Coronary artery disease   . Degenerative lumbar disc   . Dyspnea    with exertion  . Hyperlipemia   . Hypertension   . Myocardial infarction ([place]) 1984  . OA (osteoarthritis)    ankle/foot/knee/shoulder  . Osteoporosis 05/05/2015  . Trigger little finger of left hand   . Vitamin D deficiency 05/05/2015   Past Surgical History:  Procedure Laterality Date  . BICEPS TENDON REPAIR     left  . CARDIAC CATHETERIZATION    . CHOLECYSTECTOMY    . COLONOSCOPY W/ POLYPECTOMY    . CORONARY ANGIOPLASTY  1984  . EYE SURGERY Bilateral    cataracts  . IRRIGATION AND DEBRIDEMENT SHOULDER  02/09/2012   Procedure: IRRIGATION AND DEBRIDEMENT SHOULDER;  Surgeon: Augustin Schooling, MD;  Location: [place];  Service: Orthopedics;  Laterality: Right;  Right Shoulder I&D with Poly Exchange, Removal of 42 +9 Standard Poly spacer  . JOINT REPLACEMENT  1999   right knee  . JOINT REPLACEMENT  2014   right shoulder  . LEG SURGERY     right and leg  . REVERSE SHOULDER ARTHROPLASTY Left 02/11/2016   Procedure: LEFT REVERSE TOTAL SHOULDER ARTHROPLASTY;  Surgeon: Netta Cedars, MD;  Location: [place];  Service: Orthopedics;  Laterality: Left;  . REVERSE TOTAL SHOULDER ARTHROPLASTY Left 02/11/2016   Dr. [name]  . right foot surgery     bone removed  . ROTATOR CUFF REPAIR Right    right  . SKIN CANCER EXCISION     nose  . TONGUE SURGERY     removal of cancer  . TONSILLECTOMY    . TOTAL KNEE ARTHROPLASTY Left 04/17/2014   Procedure: LEFT TOTAL KNEE ARTHROPLASTY;  Surgeon: Augustin Schooling, MD;  Location: [place];  Service: Orthopedics;  Laterality: Left;  . TOTAL SHOULDER ARTHROPLASTY  01/19/2012   Procedure: TOTAL SHOULDER ARTHROPLASTY;  Surgeon: Augustin Schooling, MD;  Location: [place];  Service: Orthopedics;  Laterality: Right;  REVERSE TOTAL SHOULDER   Family History  Problem Relation Age of Onset  . Alzheimer's disease Father   . Hypertension Brother   . Heart disease Brother   . Cancer Maternal  Grandmother        male  . Cancer Paternal Grandmother   . COPD Neg Hx   . Diabetes Neg Hx   . Stroke Neg Hx    Social History   Tobacco Use  . Smoking status: Former Smoker    Packs/day: 2.00    Years: 29.00    Pack years: 58.00    Types: Cigarettes    Last attempt to quit: 04/07/1982    Years since quitting: 34.9  . Smokeless tobacco: Never Used  Substance Use Topics  . Alcohol use: Yes    Comment: occasional- beer  . Drug use: No    Interim medical history since last visit reviewed. Allergies and medications reviewed  Review of Systems Per HPI unless specifically indicated above     Objective:    BP 110/80 (BP Location: Left Arm, Patient Position: Sitting, Cuff Size: Large)   Pulse 94   Temp 98 F (36.7 C) (Oral)   Ht _0  (1.727 m)   Wt 274 lb 1.6 oz (124.3 kg)   SpO2 95%   BMI 41.68 kg/m   Wt Readings from Last 3 Encounters:  03/13/17 274 lb 1.6 oz (124.3 kg)  09/07/16 275 lb 12.8 oz (125.1 kg)  06/08/16 278 lb (126.1 kg)    Physical Exam  Constitutional: He appears well-developed and well-nourished. No distress.  Morbidly obese  HENT:  Head: Normocephalic and atraumatic.  Eyes: EOM are normal. No scleral icterus.  Neck: No thyromegaly present.  Cardiovascular: Normal rate and regular rhythm.  Pulmonary/Chest: Effort normal and breath sounds normal.  Abdominal: Soft. Bowel sounds are normal. He exhibits no distension.  Musculoskeletal: He exhibits edema (1+ pitting edema lower legs).  Neurological: Coordination normal.  Skin: Skin is warm and dry. No pallor.  Psychiatric: He has a normal mood and affect. His behavior is normal. Judgment and thought content normal.    Results for orders placed or performed in visit on 06/08/16  CBC with Differential/Platelet  Result Value Ref Range   WBC 6.7 3.8 - 10.8 K/uL   RBC 4.82 4.20 - 5.80 MIL/uL   Hemoglobin 14.8 13.2 - 17.1 g/dL   HCT 45.4 38.5 - 50.0 %   MCV 94.2 80.0 - 100.0 fL   MCH 30.7 27.0 -  33.0 pg   MCHC 32.6 32.0 - 36.0 g/dL   RDW 13.6 11.0 - 15.0 %   Platelets 181 140 - 400 K/uL   MPV 9.6 7.5 - 12.5 fL   Neutro Abs 3,685 1,500 - 7,800 cells/uL   Lymphs Abs 2,144  850 - 3,900 cells/uL   Monocytes Absolute 737 200 - 950 cells/uL   Eosinophils Absolute 134 15 - 500 cells/uL   Basophils Absolute 0 0 - 200 cells/uL   Neutrophils Relative % 55 %   Lymphocytes Relative 32 %   Monocytes Relative 11 %   Eosinophils Relative 2 %   Basophils Relative 0 %   Smear Review Criteria for review not met   COMPLETE METABOLIC PANEL WITH GFR  Result Value Ref Range   Sodium 140 135 - 146 mmol/L   Potassium 4.8 3.5 - 5.3 mmol/L   Chloride 103 98 - 110 mmol/L   CO2 29 20 - 31 mmol/L   Glucose, Bld 93 65 - 99 mg/dL   BUN 24 7 - 25 mg/dL   Creat 1.14 (H) 0.70 - 1.11 mg/dL   Total Bilirubin 0.9 0.2 - 1.2 mg/dL   Alkaline Phosphatase 66 40 - 115 U/L   AST 22 10 - 35 U/L   ALT 15 9 - 46 U/L   Total Protein 6.8 6.1 - 8.1 g/dL   Albumin 4.0 3.6 - 5.1 g/dL   Calcium 9.5 8.6 - 10.3 mg/dL   GFR, Est African American 69 >=60 mL/min   GFR, Est Non African American 60 >=60 mL/min  Hemoglobin A1c  Result Value Ref Range   Hgb A1c MFr Bld 5.4 <5.7 %   Mean Plasma Glucose 108 mg/dL  Lipid panel  Result Value Ref Range   Cholesterol 204 (H) <200 mg/dL   Triglycerides 147 <150 mg/dL   HDL 60 >40 mg/dL   Total CHOL/HDL Ratio 3.4 <5.0 Ratio   VLDL 29 <30 mg/dL   LDL Cholesterol 115 (H) <100 mg/dL  TSH  Result Value Ref Range   TSH 2.82 0.40 - 4.50 mIU/L      Assessment & Plan:   Problem List Items Addressed This Visit      Cardiovascular and Mediastinum   Essential hypertension, benign (Chronic)    Controlled, avoid decongestants; he is not going to decrease his salt      Relevant Medications   lisinopril (PRINIVIL,ZESTRIL) 5 MG tablet   pravastatin (PRAVACHOL) 40 MG tablet   metoprolol tartrate (LOPRESSOR) 25 MG tablet   Coronary artery disease (Chronic)    Seeing cardiologist  next month; on aspirin, beta-blocker, statin; no chest pain      Relevant Medications   lisinopril (PRINIVIL,ZESTRIL) 5 MG tablet   pravastatin (PRAVACHOL) 40 MG tablet   metoprolol tartrate (LOPRESSOR) 25 MG tablet   Other Relevant Orders   Lipid panel     Endocrine   IFG (impaired fasting glucose) (Chronic)    Check A1c and fasting glucose today; he is obese; he is not likely to change his diet      Relevant Orders   Hemoglobin A1c     Musculoskeletal and Integument   Osteoporosis    Patient does not think this is right; declined DEXA        Other   Vitamin D deficiency    Check level and supplement if needed      Relevant Orders   VITAMIN D 25 Hydroxy (Vit-D Deficiency, Fractures)   Morbid obesity ([facility]) (Chronic)    He is not likely to change      Medication monitoring encounter    Check liver and kidneys      Relevant Orders   CBC with Differential/Platelet   COMPLETE METABOLIC PANEL WITH GFR   Bilateral leg edema    Try  stockings and show to cardiologist          Follow up plan: Return for twenty minute follow-up with fasting labs.  An after-visit summary was printed and given to the patient at [place].  Please see the patient instructions which may contain other information and recommendations beyond what is mentioned above in the assessment and plan.  Meds ordered this encounter  Medications  . albuterol (PROVENTIL HFA;VENTOLIN HFA) 108 (90 Base) MCG/ACT inhaler    Sig: Inhale 2 puffs into the lungs every 4 (four) hours as needed.    Dispense:  3 Inhaler    Refill:  1  . lisinopril (PRINIVIL,ZESTRIL) 5 MG tablet    Sig: Take 1 tablet (5 mg total) by mouth daily.    Dispense:  90 tablet    Refill:  3  . pravastatin (PRAVACHOL) 40 MG tablet    Sig: Take 1 tablet (40 mg total) by mouth at bedtime.    Dispense:  90 tablet    Refill:  3    Going back to the 40 mg strength per pt preference  . metoprolol tartrate (LOPRESSOR) 25 MG tablet     Sig: Take 1 tablet (25 mg total) by mouth 2 (two) times daily.    Dispense:  180 tablet    Refill:  3    Orders Placed This Encounter  Procedures  . VITAMIN D 25 Hydroxy (Vit-D Deficiency, Fractures)  . CBC with Differential/Platelet  . COMPLETE METABOLIC PANEL WITH GFR  . Lipid panel  . Hemoglobin A1c

## 2017-03-13 NOTE — Assessment & Plan Note (Signed)
Controlled, avoid decongestants; he is not going to decrease his salt

## 2017-03-13 NOTE — Assessment & Plan Note (Signed)
Patient does not think this is right; declined DEXA

## 2017-03-14 LAB — COMPLETE METABOLIC PANEL WITH GFR
AG Ratio: 1.6 (calc) (ref 1.0–2.5)
ALKALINE PHOSPHATASE (APISO): 55 U/L (ref 40–115)
ALT: 16 U/L (ref 9–46)
AST: 23 U/L (ref 10–35)
Albumin: 3.9 g/dL (ref 3.6–5.1)
BILIRUBIN TOTAL: 0.8 mg/dL (ref 0.2–1.2)
BUN / CREAT RATIO: 15 (calc) (ref 6–22)
BUN: 19 mg/dL (ref 7–25)
CHLORIDE: 104 mmol/L (ref 98–110)
CO2: 31 mmol/L (ref 20–32)
Calcium: 9.7 mg/dL (ref 8.6–10.3)
Creat: 1.23 mg/dL — ABNORMAL HIGH (ref 0.70–1.11)
GFR, Est African American: 63 mL/min/{1.73_m2} (ref >=60)
GFR, Est Non African American: 55 mL/min/{1.73_m2} — ABNORMAL LOW (ref >=60)
GLUCOSE: 93 mg/dL (ref 65–99)
Globulin: 2.5 g/dL (calc) (ref 1.9–3.7)
Potassium: 4.8 mmol/L (ref 3.5–5.3)
Sodium: 141 mmol/L (ref 135–146)
Total Protein: 6.4 g/dL (ref 6.1–8.1)

## 2017-03-14 LAB — LIPID PANEL
CHOLESTEROL: 193 mg/dL (ref <200)
HDL: 65 mg/dL (ref >40)
LDL CHOLESTEROL (CALC): 106 mg/dL — AB
Non-HDL Cholesterol (Calc): 128 mg/dL (calc) (ref <130)
TRIGLYCERIDES: 123 mg/dL (ref <150)
Total CHOL/HDL Ratio: 3 (calc) (ref <5.0)

## 2017-03-14 LAB — HEMOGLOBIN A1C
Hgb A1c MFr Bld: 5.7 % of total Hgb — ABNORMAL HIGH (ref <5.7)
MEAN PLASMA GLUCOSE: 117 (calc)
eAG (mmol/L): 6.5 (calc)

## 2017-03-14 LAB — CBC WITH DIFFERENTIAL/PLATELET
BASOS ABS: 21 {cells}/uL (ref 0–200)
Basophils Relative: 0.3 %
EOS PCT: 1.6 %
Eosinophils Absolute: 110 cells/uL (ref 15–500)
HEMATOCRIT: 42.7 % (ref 38.5–50.0)
Hemoglobin: 14.5 g/dL (ref 13.2–17.1)
LYMPHS ABS: 1559 {cells}/uL (ref 850–3900)
MCH: 31.3 pg (ref 27.0–33.0)
MCHC: 34 g/dL (ref 32.0–36.0)
MCV: 92 fL (ref 80.0–100.0)
MPV: 10 fL (ref 7.5–12.5)
Monocytes Relative: 9.9 %
NEUTROS PCT: 65.6 %
Neutro Abs: 4526 cells/uL (ref 1500–7800)
PLATELETS: 180 10*3/uL (ref 140–400)
RBC: 4.64 10*6/uL (ref 4.20–5.80)
RDW: 12.2 % (ref 11.0–15.0)
TOTAL LYMPHOCYTE: 22.6 %
WBC mixed population: 683 cells/uL (ref 200–950)
WBC: 6.9 10*3/uL (ref 3.8–10.8)

## 2017-03-14 LAB — VITAMIN D 25 HYDROXY (VIT D DEFICIENCY, FRACTURES): VIT D 25 HYDROXY: 30 ng/mL (ref 30–100)

## 2017-04-14 NOTE — Progress Notes (Signed)
Spirometry already wet signed by me in April 2018

## 2017-04-14 NOTE — Progress Notes (Signed)
Signing off of order note from Jan 2019

## 2017-04-24 DIAGNOSIS — E782 Mixed hyperlipidemia: Secondary | ICD-10-CM | POA: Diagnosis not present

## 2017-04-24 DIAGNOSIS — I10 Essential (primary) hypertension: Secondary | ICD-10-CM | POA: Diagnosis not present

## 2017-04-24 DIAGNOSIS — Z6841 Body Mass Index (BMI) 40.0 and over, adult: Secondary | ICD-10-CM | POA: Diagnosis not present

## 2017-04-24 DIAGNOSIS — I872 Venous insufficiency (chronic) (peripheral): Secondary | ICD-10-CM | POA: Diagnosis not present

## 2017-04-24 DIAGNOSIS — R600 Localized edema: Secondary | ICD-10-CM | POA: Diagnosis not present

## 2017-04-24 DIAGNOSIS — I252 Old myocardial infarction: Secondary | ICD-10-CM | POA: Diagnosis not present

## 2017-04-24 DIAGNOSIS — I2510 Atherosclerotic heart disease of native coronary artery without angina pectoris: Secondary | ICD-10-CM | POA: Diagnosis not present

## 2017-05-22 DIAGNOSIS — Z7982 Long term (current) use of aspirin: Secondary | ICD-10-CM | POA: Diagnosis not present

## 2017-05-22 DIAGNOSIS — Z6841 Body Mass Index (BMI) 40.0 and over, adult: Secondary | ICD-10-CM | POA: Diagnosis not present

## 2017-05-22 DIAGNOSIS — I252 Old myocardial infarction: Secondary | ICD-10-CM | POA: Diagnosis not present

## 2017-05-22 DIAGNOSIS — E785 Hyperlipidemia, unspecified: Secondary | ICD-10-CM | POA: Diagnosis not present

## 2017-05-22 DIAGNOSIS — R69 Illness, unspecified: Secondary | ICD-10-CM | POA: Diagnosis not present

## 2017-05-22 DIAGNOSIS — R269 Unspecified abnormalities of gait and mobility: Secondary | ICD-10-CM | POA: Diagnosis not present

## 2017-05-22 DIAGNOSIS — K08409 Partial loss of teeth, unspecified cause, unspecified class: Secondary | ICD-10-CM | POA: Diagnosis not present

## 2017-05-22 DIAGNOSIS — I10 Essential (primary) hypertension: Secondary | ICD-10-CM | POA: Diagnosis not present

## 2017-05-22 DIAGNOSIS — J302 Other seasonal allergic rhinitis: Secondary | ICD-10-CM | POA: Diagnosis not present

## 2017-05-31 DIAGNOSIS — L820 Inflamed seborrheic keratosis: Secondary | ICD-10-CM | POA: Diagnosis not present

## 2017-06-13 DIAGNOSIS — R69 Illness, unspecified: Secondary | ICD-10-CM | POA: Diagnosis not present

## 2017-06-14 DIAGNOSIS — R69 Illness, unspecified: Secondary | ICD-10-CM | POA: Diagnosis not present

## 2017-07-12 ENCOUNTER — Ambulatory Visit (INDEPENDENT_AMBULATORY_CARE_PROVIDER_SITE_OTHER): Payer: Medicare HMO

## 2017-07-12 VITALS — BP 132/68 | HR 71 | Temp 97.3°F | Resp 12 | Ht 68.0 in | Wt 275.5 lb

## 2017-07-12 DIAGNOSIS — Z0000 Encounter for general adult medical examination without abnormal findings: Secondary | ICD-10-CM

## 2017-07-12 NOTE — Patient Instructions (Signed)
Mr. Housey , Thank you for taking time to come for your Medicare Wellness Visit. I appreciate your ongoing commitment to your health goals. Please review the following plan we discussed and let me know if I can assist you in the future.   Screening recommendations/referrals: Colorectal Screening: No longer required Lung Cancer Screening: You do not qualify for this screening Hepatitis C Screening: You do not qualify for this screening  Vision and Dental Exams: Recommended annual ophthalmology exams for early detection of glaucoma and other disorders of the eye Recommended annual dental exams for proper oral hygiene  Vaccinations: Influenza vaccine: Up to date Pneumococcal vaccine: Completed series Tdap vaccine: Up to date Shingles vaccine: Up to date. Please call your insurance company to determine your out of pocket expense for the Shingrix vaccine. You may also receive this vaccine at your local pharmacy or Health Dept.    Advanced directives: We have received a copy of your POA (Power of [place]) and/or Living Will. These documents can be located in your chart.  Conditions/risks identified: Recommend to exercise for at least 150 minutes per week.  Next appointment: Please schedule your Annual Wellness Visit with your Nurse Health Advisor in one year.  Preventive Care 4 Years and Older, Male Preventive care refers to lifestyle choices and visits with your health care provider that can promote health and wellness. What does preventive care include?  A yearly physical exam. This is also called an annual well check.  Dental exams once or twice a year.  Routine eye exams. Ask your health care provider how often you should have your eyes checked.  Personal lifestyle choices, including:  Daily care of your teeth and gums.  Regular physical activity.  Eating a healthy diet.  Avoiding tobacco and drug use.  Limiting alcohol use.  Practicing safe sex.  Taking low doses of  aspirin every day.  Taking vitamin and mineral supplements as recommended by your health care provider. What happens during an annual well check? The services and screenings done by your health care provider during your annual well check will depend on your age, overall health, lifestyle risk factors, and family history of disease. Counseling  Your health care provider may ask you questions about your:  Alcohol use.  Tobacco use.  Drug use.  Emotional well-being.  Home and relationship well-being.  Sexual activity.  Eating habits.  History of falls.  Memory and ability to understand (cognition).  Work and work Statistician. Screening  You may have the following tests or measurements:  Height, weight, and BMI.  Blood pressure.  Lipid and cholesterol levels. These may be checked every 5 years, or more frequently if you are over 65 years old.  Skin check.  Lung cancer screening. You may have this screening every year starting at age 68 if you have a 30-pack-year history of smoking and currently smoke or have quit within the past 15 years.  Fecal occult blood test (FOBT) of the stool. You may have this test every year starting at age 40.  Flexible sigmoidoscopy or colonoscopy. You may have a sigmoidoscopy every 5 years or a colonoscopy every 10 years starting at age 50.  Prostate cancer screening. Recommendations will vary depending on your family history and other risks.  Hepatitis C blood test.  Hepatitis B blood test.  Sexually transmitted disease (STD) testing.  Diabetes screening. This is done by checking your blood sugar (glucose) after you have not eaten for a while (fasting). You may have this done  every 1-3 years.  Abdominal aortic aneurysm (AAA) screening. You may need this if you are a current or former smoker.  Osteoporosis. You may be screened starting at age 9 if you are at high risk. Talk with your health care provider about your test results,  treatment options, and if necessary, the need for more tests. Vaccines  Your health care provider may recommend certain vaccines, such as:  Influenza vaccine. This is recommended every year.  Tetanus, diphtheria, and acellular pertussis (Tdap, Td) vaccine. You may need a Td booster every 10 years.  Zoster vaccine. You may need this after age 65.  Pneumococcal 13-valent conjugate (PCV13) vaccine. One dose is recommended after age 89.  Pneumococcal polysaccharide (PPSV23) vaccine. One dose is recommended after age 38. Talk to your health care provider about which screenings and vaccines you need and how often you need them. This information is not intended to replace advice given to you by your health care provider. Make sure you discuss any questions you have with your health care provider. Document Released: 03/12/2015 Document Revised: 11/03/2015 Document Reviewed: 12/15/2014 Elsevier Interactive Patient Education  2017 [place] Prevention in the Home Falls can cause injuries. They can happen to people of all ages. There are many things you can do to make your home safe and to help prevent falls. What can I do on the outside of my home?  Regularly fix the edges of walkways and driveways and fix any cracks.  Remove anything that might make you trip as you walk through a door, such as a raised step or threshold.  Trim any bushes or trees on the path to your home.  Use bright outdoor lighting.  Clear any walking paths of anything that might make someone trip, such as rocks or tools.  Regularly check to see if handrails are loose or broken. Make sure that both sides of any steps have handrails.  Any raised decks and porches should have guardrails on the edges.  Have any leaves, snow, or ice cleared regularly.  Use sand or salt on walking paths during winter.  Clean up any spills in your garage right away. This includes oil or grease spills. What can I do in the  bathroom?  Use night lights.  Install grab bars by the toilet and in the tub and shower. Do not use towel bars as grab bars.  Use non-skid mats or decals in the tub or shower.  If you need to sit down in the shower, use a plastic, non-slip stool.  Keep the floor dry. Clean up any water that spills on the floor as soon as it happens.  Remove soap buildup in the tub or shower regularly.  Attach bath mats securely with double-sided non-slip rug tape.  Do not have throw rugs and other things on the floor that can make you trip. What can I do in the bedroom?  Use night lights.  Make sure that you have a light by your bed that is easy to reach.  Do not use any sheets or blankets that are too big for your bed. They should not hang down onto the floor.  Have a firm chair that has side arms. You can use this for support while you get dressed.  Do not have throw rugs and other things on the floor that can make you trip. What can I do in the kitchen?  Clean up any spills right away.  Avoid walking on wet floors.  Keep  items that you use a lot in easy-to-reach places.  If you need to reach something above you, use a strong step stool that has a grab bar.  Keep electrical cords out of the way.  Do not use floor polish or wax that makes floors slippery. If you must use wax, use non-skid floor wax.  Do not have throw rugs and other things on the floor that can make you trip. What can I do with my stairs?  Do not leave any items on the stairs.  Make sure that there are handrails on both sides of the stairs and use them. Fix handrails that are broken or loose. Make sure that handrails are as long as the stairways.  Check any carpeting to make sure that it is firmly attached to the stairs. Fix any carpet that is loose or worn.  Avoid having throw rugs at the top or bottom of the stairs. If you do have throw rugs, attach them to the floor with carpet tape.  Make sure that you have a  light switch at the top of the stairs and the bottom of the stairs. If you do not have them, ask someone to add them for you. What else can I do to help prevent falls?  Wear shoes that:  Do not have high heels.  Have rubber bottoms.  Are comfortable and fit you well.  Are closed at the toe. Do not wear sandals.  If you use a stepladder:  Make sure that it is fully opened. Do not climb a closed stepladder.  Make sure that both sides of the stepladder are locked into place.  Ask someone to hold it for you, if possible.  Clearly mark and make sure that you can see:  Any grab bars or handrails.  First and last steps.  Where the edge of each step is.  Use tools that help you move around (mobility aids) if they are needed. These include:  Canes.  Walkers.  Scooters.  Crutches.  Turn on the lights when you go into a dark area. Replace any light bulbs as soon as they burn out.  Set up your furniture so you have a clear path. Avoid moving your furniture around.  If any of your floors are uneven, fix them.  If there are any pets around you, be aware of where they are.  Review your medicines with your doctor. Some medicines can make you feel dizzy. This can increase your chance of falling. Ask your doctor what other things that you can do to help prevent falls. This information is not intended to replace advice given to you by your health care provider. Make sure you discuss any questions you have with your health care provider. Document Released: 12/10/2008 Document Revised: 07/22/2015 Document Reviewed: 03/20/2014 Elsevier Interactive Patient Education  2017 [employer].

## 2017-07-12 NOTE — Progress Notes (Signed)
Subjective:   Ricky Lee is a 84 y.o. male who presents for Medicare Annual/Subsequent preventive examination.  Review of Systems:  N/A Cardiac Risk Factors include: advanced age (>67men, >11 women);dyslipidemia;hypertension;male gender;obesity (BMI >30kg/m2);sedentary lifestyle     Objective:    Vitals: BP 132/68 (BP Location: Left Arm, Patient Position: Sitting, Cuff Size: Large)   Pulse 71   Temp (!) 97.3 F (36.3 C) (Oral)   Resp 12   Ht 5\' 8"  (1.727 m)   Wt 275 lb 8 oz (125 kg)   SpO2 93%   BMI 41.89 kg/m   Body mass index is 41.89 kg/m.  Advanced Directives 07/12/2017 09/07/2016 06/08/2016 02/04/2016 11/15/2015 07/16/2015 04/20/2014  Does Patient Have a Medical Advance Directive? Yes Yes Yes Yes Yes Yes Yes  Type of Paramedic of [place];Living will - - [place];Living will Living will Living will [place];Living will  Does patient want to make changes to medical advance directive? - - - Yes (Inpatient - patient defers changing a medical advance directive at this time) - - No - Patient declined  Copy of [place] in Chart? Yes - - Yes No - copy requested No - copy requested No - copy requested  Pre-existing out of facility DNR order (yellow form or pink MOST form) - - - Yellow form placed in chart (order not valid for inpatient use) - - -    Tobacco Social History   Tobacco Use  Smoking Status Former Smoker  . Packs/day: 2.00  . Years: 29.00  . Pack years: 58.00  . Types: Cigarettes  . Last attempt to quit: 04/07/1982  . Years since quitting: 35.2  Smokeless Tobacco Never Used  Tobacco Comment   smoking cessation materials not required     Counseling given: No Comment: smoking cessation materials not required  Clinical Intake:  Pre-visit preparation completed: Yes  Pain : No/denies pain   BMI - recorded: 41.89 Nutritional Status: BMI > 30  Obese Nutritional Risks:  None Diabetes: No  How often do you need to have someone help you when you read instructions, pamphlets, or other written materials from your doctor or pharmacy?: 1 - Never  Interpreter Needed?: No  Information entered by :: AEversole, LPN  Hospitalizations/ED visits and surgeries occurring within the previous 12 months:  Within the previous 12 months, pt has not underwent any surgical procedures, has not been hospitalized for any conditions and has not been treated by an emergency room clinician.  Past Medical History:  Diagnosis Date  . Arthritis   . Cancer ([place])    oral cancer in past, skin cancer  . Complication of anesthesia    last surgery 02/04/16- confusion "lost a week"  . Coronary artery disease   . Degenerative lumbar disc   . Dyspnea    with exertion  . Hyperlipemia   . Hypertension   . Myocardial infarction ([place]) 1984  . OA (osteoarthritis)    ankle/foot/knee/shoulder  . Osteoporosis 05/05/2015  . Trigger little finger of left hand   . Vitamin D deficiency 05/05/2015   Past Surgical History:  Procedure Laterality Date  . BICEPS TENDON REPAIR     left  . CARDIAC CATHETERIZATION    . CHOLECYSTECTOMY    . COLONOSCOPY W/ POLYPECTOMY    . CORONARY ANGIOPLASTY  1984  . EYE SURGERY Bilateral    cataracts  . IRRIGATION AND DEBRIDEMENT SHOULDER  02/09/2012   Procedure: IRRIGATION AND DEBRIDEMENT SHOULDER;  Surgeon: Augustin Schooling, MD;  Location: [facility];  Service: Orthopedics;  Laterality: Right;  Right Shoulder I&D with Poly Exchange, Removal of 42 +9 Standard Poly spacer  . JOINT REPLACEMENT  1999   right knee  . JOINT REPLACEMENT  2014   right shoulder  . LEG SURGERY     right and leg  . REVERSE SHOULDER ARTHROPLASTY Left 02/11/2016   Procedure: LEFT REVERSE TOTAL SHOULDER ARTHROPLASTY;  Surgeon: Netta Cedars, MD;  Location: [facility];  Service: Orthopedics;  Laterality: Left;  . REVERSE TOTAL SHOULDER ARTHROPLASTY Left 02/11/2016   Dr. [name]  . right foot surgery      bone removed  . ROTATOR CUFF REPAIR Right    right  . SKIN CANCER EXCISION     nose  . TONGUE SURGERY     removal of cancer  . TONSILLECTOMY    . TOTAL KNEE ARTHROPLASTY Left 04/17/2014   Procedure: LEFT TOTAL KNEE ARTHROPLASTY;  Surgeon: Augustin Schooling, MD;  Location: [facility];  Service: Orthopedics;  Laterality: Left;  . TOTAL SHOULDER ARTHROPLASTY  01/19/2012   Procedure: TOTAL SHOULDER ARTHROPLASTY;  Surgeon: Augustin Schooling, MD;  Location: [facility];  Service: Orthopedics;  Laterality: Right;  REVERSE TOTAL SHOULDER   Family History  Problem Relation Age of Onset  . Diverticulitis Mother   . Alzheimer's disease Father   . Cancer Brother        mesothelioma  . Heart disease Brother   . Hypertension Brother   . Cancer Maternal Grandmother        male  . Cancer Paternal Grandmother   . COPD Neg Hx   . Diabetes Neg Hx   . Stroke Neg Hx    Social History   Socioeconomic History  . Marital status: Married    Spouse name: Nunzio Cory  . Number of children: 4  . Years of education: Not on file  . Highest education level: 12th grade  Occupational History  . Occupation: Retired  Scientific laboratory technician  . Financial resource strain: Not hard at all  . Food insecurity:    Worry: Never true    Inability: Never true  . Transportation needs:    Medical: No    Non-medical: No  Tobacco Use  . Smoking status: Former Smoker    Packs/day: 2.00    Years: 29.00    Pack years: 58.00    Types: Cigarettes    Last attempt to quit: 04/07/1982    Years since quitting: 35.2  . Smokeless tobacco: Never Used  . Tobacco comment: smoking cessation materials not required  Substance and Sexual Activity  . Alcohol use: Yes    Comment: occasional- beer  . Drug use: No  . Sexual activity: Not Currently  Lifestyle  . Physical activity:    Days per week: 0 days    Minutes per session: 0 min  . Stress: Not at all  Relationships  . Social connections:    Talks on phone: Patient refused    Gets  together: Patient refused    Attends religious service: Patient refused    Active member of club or organization: Patient refused    Attends meetings of clubs or organizations: Patient refused    Relationship status: Married  Other Topics Concern  . Not on file  Social History Narrative  . Not on file    Outpatient Encounter Medications as of 07/12/2017  Medication Sig  . albuterol (PROVENTIL HFA;VENTOLIN HFA) 108 (90 Base) MCG/ACT inhaler Inhale 2 puffs  into the lungs every 4 (four) hours as needed.  Marland Kitchen aspirin 325 MG tablet Take 325 mg by mouth daily.  . cetirizine (ZYRTEC) 5 MG tablet Take 5 mg by mouth daily.  Marland Kitchen lisinopril (PRINIVIL,ZESTRIL) 5 MG tablet Take 1 tablet (5 mg total) by mouth daily.  . metoprolol tartrate (LOPRESSOR) 25 MG tablet Take 1 tablet (25 mg total) by mouth 2 (two) times daily.  . Multiple Vitamin (MULTIVITAMIN) tablet Take 1 tablet by mouth daily.  . pravastatin (PRAVACHOL) 40 MG tablet Take 1 tablet (40 mg total) by mouth at bedtime.  . Vitamin D, Ergocalciferol, (DRISDOL) 50000 units CAPS capsule Take by mouth.   No facility-administered encounter medications on file as of 07/12/2017.     Activities of Daily Living In your present state of health, do you have any difficulty performing the following activities: 07/12/2017 03/13/2017  Hearing? N N  Comment denies hearing aids -  Vision? N N  Comment reading glasses -  Difficulty concentrating or making decisions? N N  Walking or climbing stairs? Y N  Comment dyspnea -  Dressing or bathing? N N  Doing errands, shopping? N N  Preparing Food and eating ? N -  Comment full upper dentures -  Using the Toilet? N -  In the past six months, have you accidently leaked urine? N -  Do you have problems with loss of bowel control? N -  Managing your Medications? N -  Managing your Finances? N -  Housekeeping or managing your Housekeeping? N -  Some recent data might be hidden    Patient Care Team: Lada,  Satira Anis, MD as PCP - General (Family Medicine) Park Liter, MD as Referring Physician (Cardiology) Erby Pian, MD as Consulting Physician (Specialist)   Assessment:   This is a routine wellness examination for Joshus.  Exercise Activities and Dietary recommendations Current Exercise Habits: The patient does not participate in regular exercise at present, Exercise limited by: None identified  Goals    . Exercise 150 min/wk Moderate Activity     Recommend to exercise for at least 150 minutes per week.       Fall Risk Fall Risk  07/12/2017 03/13/2017 09/07/2016 06/08/2016 11/15/2015  Falls in the past year? Yes No No No Yes  Comment rolled out of bed - - - -  Number falls in past yr: 1 - - - 1  Injury with Fall? No - - - No  Risk for fall due to : History of fall(s);Impaired vision - - - -  Risk for fall due to: Comment wears reading glasses - - - -  Follow up Falls evaluation completed;Education provided;Falls prevention discussed - - - -   FALL RISK PREVENTION PERTAINING TO HOME: Is your home free of loose throw rugs in walkways, pet beds, electrical cords, etc? Yes Is there adequate lighting in your home to reduce risk of falls?  Yes Are there stairs in or around your home WITH handrails? Yes  ASSISTIVE DEVICES UTILIZED TO PREVENT FALLS: Use of a cane, walker or w/c? No Grab bars in the bathroom? No  Shower chair or a place to sit while bathing? No An elevated toilet seat or a handicapped toilet? Yes  Timed Get Up and Go Performed: Yes. Pt ambulated 10 feet within 18 sec. Gait slow, steady and without the use of an assistive device. No intervention required at this time. Fall risk prevention has been discussed.  Community Resource Referral:  Pt declined my  offer to send [employer] Referral to Care Guide for installation of grab bars in the shower or a shower chair.  Depression Screen PHQ 2/9 Scores 07/12/2017 03/13/2017 09/07/2016 06/08/2016  PHQ - 2 Score  2 0 0 0  PHQ- 9 Score 5 - - -    Cognitive Function     6CIT Screen 07/12/2017  What Year? 0 points  What month? 0 points  What time? 3 points  Count back from 20 0 points  Months in reverse 0 points  Repeat phrase 0 points  Total Score 3    Immunization History  Administered Date(s) Administered  . Influenza, High Dose Seasonal PF 11/15/2015, 11/30/2016  . Influenza-Unspecified 11/13/2013, 01/11/2015, 11/30/2016  . Pneumococcal Conjugate-13 05/20/2013  . Pneumococcal Polysaccharide-23 02/28/2007  . Td 02/28/2012  . Zoster 12/10/2013    Qualifies for Shingles Vaccine? Yes. Zostavax completed 12/10/13. Due for Shingrix. Education has been provided regarding the importance of this vaccine. Pt has been advised to call his insurance company to determine his out of pocket expense. Advised he may also receive this vaccine at his local pharmacy or Health Dept. Verbalized acceptance and understanding.  Screening Tests Health Maintenance  Topic Date Due  . INFLUENZA VACCINE  09/27/2017  . TETANUS/TDAP  02/27/2022  . PNA vac Low Risk Adult  Completed   Cancer Screenings: Lung: Low Dose CT Chest recommended if Age 53-80 years, 30 pack-year currently smoking OR have quit w/in 15years. Patient does not qualify. Colorectal: No longer required  Additional Screenings: Hepatitis C Screening: Does not qualify     Plan:  I have personally reviewed and addressed the Medicare Annual Wellness questionnaire and have noted the following in the patient's chart:  A. Medical and social history B. Use of alcohol, tobacco or illicit drugs  C. Current medications and supplements D. Functional ability and status E.  Nutritional status F.  Physical activity G. Advance directives H. List of other physicians I.  Hospitalizations, surgeries, and ER visits in previous 12 months J.  [place] such as hearing and vision if needed, cognitive and depression L. Referrals and  appointments  In addition, I have reviewed and discussed with patient certain preventive protocols, quality metrics, and best practice recommendations. A written personalized care plan for preventive services as well as general preventive health recommendations were provided to patient.  See attached scanned questionnaire for additional information.   Signed,  Aleatha Borer, LPN Nurse Health Advisor

## 2017-09-05 DIAGNOSIS — H6063 Unspecified chronic otitis externa, bilateral: Secondary | ICD-10-CM | POA: Diagnosis not present

## 2017-09-05 DIAGNOSIS — J300 Vasomotor rhinitis: Secondary | ICD-10-CM | POA: Diagnosis not present

## 2017-09-05 DIAGNOSIS — H6123 Impacted cerumen, bilateral: Secondary | ICD-10-CM | POA: Diagnosis not present

## 2017-10-11 DIAGNOSIS — L02213 Cutaneous abscess of chest wall: Secondary | ICD-10-CM | POA: Diagnosis not present

## 2017-10-11 DIAGNOSIS — L0390 Cellulitis, unspecified: Secondary | ICD-10-CM | POA: Diagnosis not present

## 2017-10-16 DIAGNOSIS — I10 Essential (primary) hypertension: Secondary | ICD-10-CM | POA: Diagnosis not present

## 2017-10-16 DIAGNOSIS — I872 Venous insufficiency (chronic) (peripheral): Secondary | ICD-10-CM | POA: Diagnosis not present

## 2017-10-16 DIAGNOSIS — I2510 Atherosclerotic heart disease of native coronary artery without angina pectoris: Secondary | ICD-10-CM | POA: Diagnosis not present

## 2017-10-16 DIAGNOSIS — I252 Old myocardial infarction: Secondary | ICD-10-CM | POA: Diagnosis not present

## 2017-10-16 DIAGNOSIS — E782 Mixed hyperlipidemia: Secondary | ICD-10-CM | POA: Diagnosis not present

## 2017-11-13 DIAGNOSIS — H524 Presbyopia: Secondary | ICD-10-CM | POA: Diagnosis not present

## 2017-12-11 DIAGNOSIS — R69 Illness, unspecified: Secondary | ICD-10-CM | POA: Diagnosis not present

## 2018-01-01 DIAGNOSIS — R69 Illness, unspecified: Secondary | ICD-10-CM | POA: Diagnosis not present

## 2018-05-13 ENCOUNTER — Telehealth: Payer: Self-pay | Admitting: Family Medicine

## 2018-05-13 NOTE — Telephone Encounter (Signed)
Copied from [place] [id number] [id number]. Topic: Quick Communication - Rx Refill/Question >> May 13, 2018  3:16 PM Gustavus Messing wrote: Medication: metoprolol tartrate (LOPRESSOR) 25 MG tablet  lisinopril (PRINIVIL,ZESTRIL) 5 MG tablet  pravastatin (PRAVACHOL) 40 MG tablet   Has the patient contacted their pharmacy? No. Prescriptions have expired and needs new ones (Agent: If no, request that the patient contact the pharmacy for the refill.) (Agent: If yes, when and what did the pharmacy advise?)  Preferred Pharmacy (with phone number or street name): [place], [place] - [address]  Agent: Please be advised that RX refills may take up to 3 business days. We ask that you follow-up with your pharmacy.

## 2018-05-14 MED ORDER — METOPROLOL TARTRATE 25 MG PO TABS: 25.0000 mg | ORAL_TABLET | Freq: Two times a day (BID) | ORAL | 0 refills | Status: DC

## 2018-05-14 MED ORDER — LISINOPRIL 5 MG PO TABS: 5.0000 mg | ORAL_TABLET | Freq: Every day | ORAL | 0 refills | Status: DC

## 2018-05-14 MED ORDER — PRAVASTATIN SODIUM 40 MG PO TABS: 40.0000 mg | ORAL_TABLET | Freq: Every day | ORAL | 0 refills | Status: DC

## 2018-05-14 NOTE — Telephone Encounter (Signed)
Rx refill for [facility]

## 2018-05-14 NOTE — Addendum Note (Signed)
Addended by: Docia Furl on: 05/14/2018 12:29 PM   Modules accepted: Orders

## 2018-05-14 NOTE — Telephone Encounter (Signed)
I've not seen the patient since January 2019 Last labs were January 2019 He did see our LPN in May, however I'll refill meds and ask that he be seen in the next month; labs are needed

## 2018-05-14 NOTE — Addendum Note (Signed)
Addended by: Maelys Kinnick, Satira Anis on: 05/14/2018 01:09 PM   Modules accepted: Orders

## 2018-05-14 NOTE — Telephone Encounter (Signed)
appt made for 4.15.2020

## 2018-05-21 ENCOUNTER — Other Ambulatory Visit: Payer: Self-pay | Admitting: Family Medicine

## 2018-06-11 ENCOUNTER — Telehealth: Payer: Self-pay | Admitting: Family Medicine

## 2018-06-11 NOTE — Telephone Encounter (Signed)
I called patient and begged him to do a telehealth visit I asked if he could please do a telehealth instead of coming into the office for a visit tomorrow citing his [id number]-[id number] risk of complications; he scores a 7 which is quite high (age, obesity, heart disease, etc); I reviewed this with him; he says if it's his time, it's his time When I strongly suggested the telephone option instead of in person, he says he can just wait rather than do telehealth I asked if any reason why he wouldn't do telehealth: "I don't believe in it," saying he doesn't get on the internet I explained that this would just be a phone call, just like we're doing now He says he can wait I explained that he doesn't have to get on the internet or download anything at all, it would just be talking over the telephone He declined doing a telephone visit and says he'll just postpone his visit to a later date "I take full responsibility", he says He joked that he's stubborn and he's an old goat I voiced my concerns and just don't want him to get sick coming in our office, and also to protect health care staff I explained if he absolutely refuses the telehealth visit, we can do a face-to-face visit but I really prefer telehealth at this time because of the COVID-19 pandemic, unless there is something pressing that requires a face-to-face visit He says that he is okay to postponing the visit, again declines the telephone option, and says staff can call him to reschedule; I could tell there was resistance; I spoke with wife I offered compromise to meet in the car; wife says he hasn't seen another doctor in a year, he wants labs down, elbow pain, hurts with bending, better today than yesterday, wearing arthritis glove No fever, no SHOB, no cough Then let's do meet in the parking lot; staff can go out to get vitals; we'll do what we can by phone, then I'll come examine him; she agrees For labs, he can come in and go straight back through  private entrance, get labs, go out the same way; advised to use automatic door exit [employer] [id number]

## 2018-06-12 ENCOUNTER — Encounter: Payer: Self-pay | Admitting: Family Medicine

## 2018-06-12 ENCOUNTER — Ambulatory Visit (INDEPENDENT_AMBULATORY_CARE_PROVIDER_SITE_OTHER): Payer: Medicare HMO | Admitting: Family Medicine

## 2018-06-12 ENCOUNTER — Ambulatory Visit
Admission: RE | Admit: 2018-06-12 | Discharge: 2018-06-12 | Disposition: A | Discharge status: Home / Self Care | Payer: Medicare HMO | Source: Ambulatory Visit | Attending: Family Medicine | Admitting: Family Medicine

## 2018-06-12 ENCOUNTER — Telehealth: Payer: Self-pay | Admitting: Family Medicine

## 2018-06-12 ENCOUNTER — Other Ambulatory Visit: Payer: Self-pay

## 2018-06-12 ENCOUNTER — Ambulatory Visit
Admission: RE | Admit: 2018-06-12 | Discharge: 2018-06-12 | Disposition: A | Discharge status: Home / Self Care | Payer: Medicare HMO | Attending: Family Medicine | Admitting: Family Medicine

## 2018-06-12 VITALS — BP 128/76 | HR 56 | Temp 98.0°F | Resp 12

## 2018-06-12 DIAGNOSIS — R8290 Unspecified abnormal findings in urine: Secondary | ICD-10-CM

## 2018-06-12 DIAGNOSIS — M545 Low back pain, unspecified: Secondary | ICD-10-CM

## 2018-06-12 DIAGNOSIS — R630 Anorexia: Secondary | ICD-10-CM | POA: Diagnosis not present

## 2018-06-12 DIAGNOSIS — M79602 Pain in left arm: Secondary | ICD-10-CM | POA: Diagnosis not present

## 2018-06-12 DIAGNOSIS — R7301 Impaired fasting glucose: Secondary | ICD-10-CM

## 2018-06-12 DIAGNOSIS — G8929 Other chronic pain: Secondary | ICD-10-CM

## 2018-06-12 DIAGNOSIS — M8949 Other hypertrophic osteoarthropathy, multiple sites: Secondary | ICD-10-CM

## 2018-06-12 DIAGNOSIS — M150 Primary generalized (osteo)arthritis: Secondary | ICD-10-CM | POA: Diagnosis not present

## 2018-06-12 DIAGNOSIS — E559 Vitamin D deficiency, unspecified: Secondary | ICD-10-CM

## 2018-06-12 DIAGNOSIS — Z5181 Encounter for therapeutic drug level monitoring: Secondary | ICD-10-CM

## 2018-06-12 DIAGNOSIS — M810 Age-related osteoporosis without current pathological fracture: Secondary | ICD-10-CM

## 2018-06-12 DIAGNOSIS — E782 Mixed hyperlipidemia: Secondary | ICD-10-CM

## 2018-06-12 DIAGNOSIS — M159 Polyosteoarthritis, unspecified: Secondary | ICD-10-CM

## 2018-06-12 DIAGNOSIS — I10 Essential (primary) hypertension: Secondary | ICD-10-CM

## 2018-06-12 MED ORDER — LISINOPRIL 5 MG PO TABS: 5.0000 mg | ORAL_TABLET | Freq: Every day | ORAL | 3 refills | Status: DC

## 2018-06-12 MED ORDER — METOPROLOL TARTRATE 25 MG PO TABS: 25.0000 mg | ORAL_TABLET | Freq: Two times a day (BID) | ORAL | 3 refills | Status: DC

## 2018-06-12 MED ORDER — PRAVASTATIN SODIUM 40 MG PO TABS: 40.0000 mg | ORAL_TABLET | Freq: Every day | ORAL | 3 refills | Status: DC

## 2018-06-12 NOTE — Assessment & Plan Note (Signed)
Check A1c today.

## 2018-06-12 NOTE — Patient Instructions (Addendum)
Let's get labs today We'll review these in detail by phone in the next few days Call if any issues or changes in your health

## 2018-06-12 NOTE — Assessment & Plan Note (Signed)
Check vit D 

## 2018-06-12 NOTE — Telephone Encounter (Signed)
Patient has multiple abnormal findings on imaging done today Discussed findings with him by phone He has an established relationship with Dr. Netta Cedars in [place]  I called personally to make the appointment Appointment tomorrow at 2:30 pm, arrive at 2:00 pm Wife will need to wait in the car, limiting visitors [address] office Back will be done at a separate appointment Wife notified of appt, husband too

## 2018-06-12 NOTE — Progress Notes (Signed)
BP 128/76 (BP Location: Right Arm, Patient Position: Standing, Cuff Size: Large)   Pulse (!) 56   Temp 98 F (36.7 C)   Resp 12   SpO2 95%    Subjective:    Patient ID: Ricky Lee, male    DOB: [birth year], 85 y.o.   MRN: [id number]  HPI: Ricky Lee is a 85 y.o. male  Chief Complaint  Patient presents with  . Medication Refill  . Arthritis    HPI Patient is here for follow-up His wife is here as well He has not been to the doctor for over a year other than his heart doctor Wants bloodwork done to check cholesterol; he has hardly been eating at all; not eating healthier, just eating less; sometimes hardly anything for a day or two; maybe some depression because of his son and his brother has cancer; he cannot go to [place] to see him; it's cancer in the bone and he is getting chemo; he is 85 years old and they just diagnosed him because he was losing weight Pain in his left arm, thinks it is his arthritis; left elbow; couldn't hardly bend it for a few days; no swelling, just pain; steady pain, runs from fingers to the elbow; whole arm; no hx of gout; family hx of rheumatoid athritis; mother and father both; he has been wearing a sleeve for arthritis, a copper glove which helps No appetite and no energy; not sure if depression with family members being sick He just saw his heart doctor and had a stress test and he passed wife says; they encourged him to walk more often; he had surgery on the one foot, both knees replaced, so he does not go far when he does walk; he won't go to PT when I offered; one story home He has maybe lost just a few pounds, nothing significant Urine does look dark at times A little unsteady when walking; he declines recommendation for PT; denies headache, does not think he has a brain tumor  Depression screen [facility] 2/9 06/12/2018 07/12/2017 03/13/2017 09/07/2016 06/08/2016  Decreased Interest 3 1 0 0 0  Down, Depressed, Hopeless 1 1 0 0 0  PHQ - 2 Score 4 2  0 0 0  Altered sleeping 0 1 - - -  Tired, decreased energy 3 1 - - -  Change in appetite 3 0 - - -  Feeling bad or failure about yourself  0 0 - - -  Trouble concentrating 0 0 - - -  Moving slowly or fidgety/restless 0 1 - - -  Suicidal thoughts 0 0 - - -  PHQ-9 Score 10 5 - - -  Difficult doing work/chores Not difficult at all Not difficult at all - - -  MD note: patient denies SI/HI  Fall Risk  06/12/2018 07/12/2017 03/13/2017 09/07/2016 06/08/2016  Falls in the past year? 0 Yes No No No  Comment - rolled out of bed - - -  Number falls in past yr: - 1 - - -  Injury with Fall? - No - - -  Risk for fall due to : - History of fall(s);Impaired vision - - -  Risk for fall due to: Comment - wears reading glasses - - -  Follow up - Falls evaluation completed;Education provided;Falls prevention discussed - - -    Relevant past medical, surgical, family and social history reviewed Past Medical History:  Diagnosis Date  . Arthritis   . Cancer ([place])  oral cancer in past, skin cancer  . Complication of anesthesia    last surgery 02/04/16- confusion "lost a week"  . Coronary artery disease   . Degenerative lumbar disc   . Dyspnea    with exertion  . Hyperlipemia   . Hypertension   . Myocardial infarction ([place]) 1984  . OA (osteoarthritis)    ankle/foot/knee/shoulder  . Osteoporosis 05/05/2015  . Trigger little finger of left hand   . Vitamin D deficiency 05/05/2015   Past Surgical History:  Procedure Laterality Date  . BICEPS TENDON REPAIR     left  . CARDIAC CATHETERIZATION    . CHOLECYSTECTOMY    . COLONOSCOPY W/ POLYPECTOMY    . CORONARY ANGIOPLASTY  1984  . EYE SURGERY Bilateral    cataracts  . IRRIGATION AND DEBRIDEMENT SHOULDER  02/09/2012   Procedure: IRRIGATION AND DEBRIDEMENT SHOULDER;  Surgeon: Augustin Schooling, MD;  Location: [facility];  Service: Orthopedics;  Laterality: Right;  Right Shoulder I&D with Poly Exchange, Removal of 42 +9 Standard Poly spacer  . JOINT REPLACEMENT   1999   right knee  . JOINT REPLACEMENT  2014   right shoulder  . LEG SURGERY     right and leg  . REVERSE SHOULDER ARTHROPLASTY Left 02/11/2016   Procedure: LEFT REVERSE TOTAL SHOULDER ARTHROPLASTY;  Surgeon: Netta Cedars, MD;  Location: [facility];  Service: Orthopedics;  Laterality: Left;  . REVERSE TOTAL SHOULDER ARTHROPLASTY Left 02/11/2016   Dr. [name]  . right foot surgery     bone removed  . ROTATOR CUFF REPAIR Right    right  . SKIN CANCER EXCISION     nose  . TONGUE SURGERY     removal of cancer  . TONSILLECTOMY    . TOTAL KNEE ARTHROPLASTY Left 04/17/2014   Procedure: LEFT TOTAL KNEE ARTHROPLASTY;  Surgeon: Augustin Schooling, MD;  Location: [facility];  Service: Orthopedics;  Laterality: Left;  . TOTAL SHOULDER ARTHROPLASTY  01/19/2012   Procedure: TOTAL SHOULDER ARTHROPLASTY;  Surgeon: Augustin Schooling, MD;  Location: [facility];  Service: Orthopedics;  Laterality: Right;  REVERSE TOTAL SHOULDER   Family History  Problem Relation Age of Onset  . Diverticulitis Mother   . Alzheimer's disease Father   . Cancer Brother        mesothelioma  . Heart disease Brother   . Hypertension Brother   . Cancer Maternal Grandmother        male  . Cancer Paternal Grandmother   . COPD Neg Hx   . Diabetes Neg Hx   . Stroke Neg Hx    Social History   Tobacco Use  . Smoking status: Former Smoker    Packs/day: 2.00    Years: 29.00    Pack years: 58.00    Types: Cigarettes    Last attempt to quit: 04/07/1982    Years since quitting: 36.2  . Smokeless tobacco: Never Used  . Tobacco comment: smoking cessation materials not required  Substance Use Topics  . Alcohol use: Yes    Comment: occasional- beer  . Drug use: No     [facility] Visit from 06/12/2018 in [facility]  AUDIT-C Score  2      Interim medical history since last visit reviewed. Allergies and medications reviewed  Review of Systems Per HPI unless specifically indicated above     Objective:    BP 128/76  (BP Location: Right Arm, Patient Position: Standing, Cuff Size: Large)   Pulse (!) 56  Temp 98 F (36.7 C)   Resp 12   SpO2 95%   Wt Readings from Last 3 Encounters:  07/12/17 275 lb 8 oz (125 kg)  03/13/17 274 lb 1.6 oz (124.3 kg)  09/07/16 275 lb 12.8 oz (125.1 kg)    Physical Exam Constitutional:      General: He is not in acute distress.    Appearance: He is well-developed. He is obese.  HENT:     Head: Normocephalic and atraumatic.  Eyes:     General: No scleral icterus. Neck:     Thyroid: No thyromegaly.  Cardiovascular:     Rate and Rhythm: Normal rate and regular rhythm.  Pulmonary:     Effort: Pulmonary effort is normal.     Breath sounds: Normal breath sounds.     Comments: Dyspnea with exertion Abdominal:     General: Bowel sounds are normal. There is no distension.     Palpations: Abdomen is soft.  Musculoskeletal:     Lumbar back: He exhibits tenderness.     Left forearm: He exhibits tenderness. He exhibits no swelling, no edema and no deformity.       Arms:     Left hand: He exhibits decreased range of motion and deformity.     Comments: Surgical scar over the extensor surface LEFT elbow, proximal forearm; point tenderness just proximal to elbow; ulnar deviation of the left fingers; swelling of the MCPs left > right; tender to palpation over the left SI joint; no tenderness over the midline lumbar region  Skin:    General: Skin is warm and dry.     Coloration: Skin is not pale.  Neurological:     Mental Status: He is alert.     Coordination: Coordination normal.  Psychiatric:        Mood and Affect: Mood is not anxious or depressed.        Behavior: Behavior normal.        Thought Content: Thought content normal.        Judgment: Judgment normal.     Results for orders placed or performed in visit on 03/13/17  VITAMIN D 25 Hydroxy (Vit-D Deficiency, Fractures)  Result Value Ref Range   Vit D, 25-Hydroxy 30 30 - 100 ng/mL  CBC with  Differential/Platelet  Result Value Ref Range   WBC 6.9 3.8 - 10.8 Thousand/uL   RBC 4.64 4.20 - 5.80 Million/uL   Hemoglobin 14.5 13.2 - 17.1 g/dL   HCT 42.7 38.5 - 50.0 %   MCV 92.0 80.0 - 100.0 fL   MCH 31.3 27.0 - 33.0 pg   MCHC 34.0 32.0 - 36.0 g/dL   RDW 12.2 11.0 - 15.0 %   Platelets 180 140 - 400 Thousand/uL   MPV 10.0 7.5 - 12.5 fL   Neutro Abs 4,526 1,500 - 7,800 cells/uL   Lymphs Abs 1,559 850 - 3,900 cells/uL   WBC mixed population 683 200 - 950 cells/uL   Eosinophils Absolute 110 15 - 500 cells/uL   Basophils Absolute 21 0 - 200 cells/uL   Neutrophils Relative % 65.6 %   Total Lymphocyte 22.6 %   Monocytes Relative 9.9 %   Eosinophils Relative 1.6 %   Basophils Relative 0.3 %  COMPLETE METABOLIC PANEL WITH GFR  Result Value Ref Range   Glucose, Bld 93 65 - 99 mg/dL   BUN 19 7 - 25 mg/dL   Creat 1.23 (H) 0.70 - 1.11 mg/dL   GFR, Est Non African American  55 (L) > OR = 60 mL/min/1.20m2   GFR, Est African American 63 > OR = 60 mL/min/1.63m2   BUN/Creatinine Ratio 15 6 - 22 (calc)   Sodium 141 135 - 146 mmol/L   Potassium 4.8 3.5 - 5.3 mmol/L   Chloride 104 98 - 110 mmol/L   CO2 31 20 - 32 mmol/L   Calcium 9.7 8.6 - 10.3 mg/dL   Total Protein 6.4 6.1 - 8.1 g/dL   Albumin 3.9 3.6 - 5.1 g/dL   Globulin 2.5 1.9 - 3.7 g/dL (calc)   AG Ratio 1.6 1.0 - 2.5 (calc)   Total Bilirubin 0.8 0.2 - 1.2 mg/dL   Alkaline phosphatase (APISO) 55 40 - 115 U/L   AST 23 10 - 35 U/L   ALT 16 9 - 46 U/L  Lipid panel  Result Value Ref Range   Cholesterol 193 <200 mg/dL   HDL 65 >40 mg/dL   Triglycerides 123 <150 mg/dL   LDL Cholesterol (Calc) 106 (H) mg/dL (calc)   Total CHOL/HDL Ratio 3.0 <5.0 (calc)   Non-HDL Cholesterol (Calc) 128 <130 mg/dL (calc)  Hemoglobin A1c  Result Value Ref Range   Hgb A1c MFr Bld 5.7 (H) <5.7 % of total Hgb   Mean Plasma Glucose 117 (calc)   eAG (mmol/L) 6.5 (calc)      Assessment & Plan:   Problem List Items Addressed This Visit       Cardiovascular and Mediastinum   Essential hypertension, benign (Chronic)   Relevant Medications   pravastatin (PRAVACHOL) 40 MG tablet   lisinopril (PRINIVIL,ZESTRIL) 5 MG tablet   metoprolol tartrate (LOPRESSOR) 25 MG tablet     Endocrine   IFG (impaired fasting glucose) (Chronic)    Check A1c today      Relevant Orders   Hemoglobin A1c     Musculoskeletal and Integument   Osteoarthritis (Chronic)   Osteoporosis     Other   Hyperlipidemia (Chronic)   Relevant Medications   pravastatin (PRAVACHOL) 40 MG tablet   lisinopril (PRINIVIL,ZESTRIL) 5 MG tablet   metoprolol tartrate (LOPRESSOR) 25 MG tablet   Other Relevant Orders   Lipid panel   Morbid obesity (HCC) (Chronic)   Relevant Orders   TSH   T4, free   Medication monitoring encounter   Relevant Orders   CBC with Differential/Platelet   COMPLETE METABOLIC PANEL WITH GFR   Vitamin D deficiency    Check vit D      Relevant Orders   VITAMIN D 25 Hydroxy (Vit-D Deficiency, Fractures)    Other Visit Diagnoses    Arm pain, left    -  Primary   Relevant Orders   DG Forearm Left   DG Hand Complete Left   DG Elbow Complete Left   Protein,Total and Elect and IFE,24   Protein Electrophoresis, (serum)   ANA,IFA RA Diag Pnl w/rflx Tit/Patn   Loss of appetite       Relevant Orders   C-reactive protein   Abnormal urine       Relevant Orders   Protein,Total and Elect and IFE,24   Protein Electrophoresis, (serum)   Urinalysis w microscopic + reflex cultur   Chronic bilateral low back pain without sciatica       Relevant Orders   C-reactive protein   DG Lumbar Spine Complete   ANA,IFA RA Diag Pnl w/rflx Tit/Patn       Follow up plan: Return in about 3 months (around 09/11/2018) for follow-up visit with Dr. Sanda Klein.  An  after-visit summary was printed and given to the patient at [place].  Please see the patient instructions which may contain other information and recommendations beyond what is mentioned above in  the assessment and plan.  Meds ordered this encounter  Medications  . pravastatin (PRAVACHOL) 40 MG tablet    Sig: Take 1 tablet (40 mg total) by mouth at bedtime.    Dispense:  90 tablet    Refill:  3    Going back to the 40 mg strength per pt preference  . lisinopril (PRINIVIL,ZESTRIL) 5 MG tablet    Sig: Take 1 tablet (5 mg total) by mouth daily.    Dispense:  90 tablet    Refill:  3  . metoprolol tartrate (LOPRESSOR) 25 MG tablet    Sig: Take 1 tablet (25 mg total) by mouth 2 (two) times daily.    Dispense:  180 tablet    Refill:  3    Orders Placed This Encounter  Procedures  . DG Lumbar Spine Complete  . DG Forearm Left  . DG Hand Complete Left  . DG Elbow Complete Left  . C-reactive protein  . TSH  . T4, free  . CBC with Differential/Platelet  . COMPLETE METABOLIC PANEL WITH GFR  . Protein,Total and Elect and IFE,24  . Protein Electrophoresis, (serum)  . ANA,IFA RA Diag Pnl w/rflx Tit/Patn  . Urinalysis w microscopic + reflex cultur  . Lipid panel  . VITAMIN D 25 Hydroxy (Vit-D Deficiency, Fractures)  . Hemoglobin A1c

## 2018-06-13 ENCOUNTER — Telehealth: Payer: Self-pay

## 2018-06-13 DIAGNOSIS — M79642 Pain in left hand: Secondary | ICD-10-CM | POA: Insufficient documentation

## 2018-06-13 DIAGNOSIS — M25522 Pain in left elbow: Secondary | ICD-10-CM | POA: Insufficient documentation

## 2018-06-13 DIAGNOSIS — M2540 Effusion, unspecified joint: Secondary | ICD-10-CM

## 2018-06-13 DIAGNOSIS — M2550 Pain in unspecified joint: Secondary | ICD-10-CM

## 2018-06-13 NOTE — Progress Notes (Signed)
Ricky Lee, please let the patient know that his marker of inflammation is elevated. Send copies of his labs and my note from yesterday to the orthopaedist he is seeing this afternoon (Dr. Netta Cedars). Please ADD ON a uric acid level to blood in lab (order uric acid, dx joint pain, joint swelling). His kidney function has declined a little more, so avoid NSAIDs and stay well-hydrated. No blood or protein in his urine. Prediabetes, so weight loss and healthy eating are key. Cholesterol is higher than ideal, ask if he's willing to switch to more powerful statin OR continue current statin dose and add Zetia OR we could do an injectable (one shot every two weeks for lipids).

## 2018-06-13 NOTE — Telephone Encounter (Signed)
-----   Message from Arnetha Courser, MD sent at 06/13/2018  1:27 PM EDT ----- Joelene Millin, please let the patient know that his marker of inflammation is elevated. Send copies of his labs and my note from yesterday to the orthopaedist he is seeing this afternoon (Dr. Netta Cedars). Please ADD ON a uric acid level to blood in lab (order uric acid, dx joint pain, joint swelling). His kidney function has declined a little more, so avoid NSAIDs and stay well-hydrated. No blood or protein in his urine. Prediabetes, so weight loss and healthy eating are key. Cholesterol is higher than ideal, ask if he's willing to switch to more powerful statin OR continue current statin dose and add Zetia OR we could do an injectable (one shot every two weeks for lipids).

## 2018-06-16 LAB — ANA,IFA RA DIAG PNL W/RFLX TIT/PATN
Anti Nuclear Antibody (ANA): POSITIVE — AB
Cyclic Citrullin Peptide Ab: <16 UNITS
Rheumatoid fact SerPl-aCnc: <14 IU/mL (ref <14)

## 2018-06-16 LAB — ANTI-NUCLEAR AB-TITER (ANA TITER): ANA Titer 1: 1:40 {titer} — ABNORMAL HIGH

## 2018-06-16 LAB — URINALYSIS W MICROSCOPIC + REFLEX CULTURE
Bacteria, UA: NONE SEEN /HPF
Bilirubin Urine: NEGATIVE
Glucose, UA: NEGATIVE
Hgb urine dipstick: NEGATIVE
Hyaline Cast: NONE SEEN /LPF
Ketones, ur: NEGATIVE
Leukocyte Esterase: NEGATIVE
Nitrites, Initial: NEGATIVE
Protein, ur: NEGATIVE
RBC / HPF: NONE SEEN /HPF (ref 0–2)
Specific Gravity, Urine: 1.02 (ref 1.001–1.03)
Squamous Epithelial / HPF: NONE SEEN /HPF (ref <=5)
WBC, UA: NONE SEEN /HPF (ref 0–5)
pH: <=5 (ref 5.0–8.0)

## 2018-06-16 LAB — TEST AUTHORIZATION

## 2018-06-16 LAB — HEMOGLOBIN A1C
Hgb A1c MFr Bld: 5.8 % of total Hgb — ABNORMAL HIGH (ref <5.7)
Mean Plasma Glucose: 120 (calc)
eAG (mmol/L): 6.6 (calc)

## 2018-06-16 LAB — LIPID PANEL
Cholesterol: 195 mg/dL (ref <200)
HDL: 62 mg/dL (ref >=40)
LDL Cholesterol (Calc): 114 mg/dL (calc) — ABNORMAL HIGH
Non-HDL Cholesterol (Calc): 133 mg/dL (calc) — ABNORMAL HIGH (ref <130)
Total CHOL/HDL Ratio: 3.1 (calc) (ref <5.0)
Triglycerides: 88 mg/dL (ref <150)

## 2018-06-16 LAB — COMPLETE METABOLIC PANEL WITH GFR
AG Ratio: 1.5 (calc) (ref 1.0–2.5)
ALT: 15 U/L (ref 9–46)
AST: 23 U/L (ref 10–35)
Albumin: 3.8 g/dL (ref 3.6–5.1)
Alkaline phosphatase (APISO): 57 U/L (ref 35–144)
BUN/Creatinine Ratio: 20 (calc) (ref 6–22)
BUN: 25 mg/dL (ref 7–25)
CO2: 26 mmol/L (ref 20–32)
Calcium: 9.1 mg/dL (ref 8.6–10.3)
Chloride: 105 mmol/L (ref 98–110)
Creat: 1.28 mg/dL — ABNORMAL HIGH (ref 0.70–1.11)
GFR, Est African American: 60 mL/min/{1.73_m2} (ref >=60)
GFR, Est Non African American: 51 mL/min/{1.73_m2} — ABNORMAL LOW (ref >=60)
Globulin: 2.5 g/dL (calc) (ref 1.9–3.7)
Glucose, Bld: 92 mg/dL (ref 65–99)
Potassium: 4.8 mmol/L (ref 3.5–5.3)
Sodium: 140 mmol/L (ref 135–146)
Total Bilirubin: 0.8 mg/dL (ref 0.2–1.2)
Total Protein: 6.3 g/dL (ref 6.1–8.1)

## 2018-06-16 LAB — PROTEIN,TOTAL AND ELECT AND IFE,24
Creatinine, 24H Ur: 2.04 g/(24.h) (ref 0.50–2.15)
PROTEIN/CREATININE RATIO: 0.103 mg/mg creat (ref <=0.1)
PROTEIN/CREATININE RATIO: 103 mg/g creat (ref <=114)
Protein, 24H Urine: 210 mg/24 h — ABNORMAL HIGH (ref 0–149)

## 2018-06-16 LAB — CBC WITH DIFFERENTIAL/PLATELET
Absolute Monocytes: 790 cells/uL (ref 200–950)
Basophils Absolute: 30 cells/uL (ref 0–200)
Basophils Relative: 0.4 %
Eosinophils Absolute: 122 cells/uL (ref 15–500)
Eosinophils Relative: 1.6 %
HCT: 43.9 % (ref 38.5–50.0)
Hemoglobin: 14.8 g/dL (ref 13.2–17.1)
Lymphs Abs: 1923 cells/uL (ref 850–3900)
MCH: 32.2 pg (ref 27.0–33.0)
MCHC: 33.7 g/dL (ref 32.0–36.0)
MCV: 95.4 fL (ref 80.0–100.0)
MPV: 10.2 fL (ref 7.5–12.5)
Monocytes Relative: 10.4 %
Neutro Abs: 4735 cells/uL (ref 1500–7800)
Neutrophils Relative %: 62.3 %
Platelets: 169 10*3/uL (ref 140–400)
RBC: 4.6 10*6/uL (ref 4.20–5.80)
RDW: 12.1 % (ref 11.0–15.0)
Total Lymphocyte: 25.3 %
WBC: 7.6 10*3/uL (ref 3.8–10.8)

## 2018-06-16 LAB — PROTEIN ELECTROPHORESIS, SERUM
Albumin ELP: 3.6 g/dL — ABNORMAL LOW (ref 3.8–4.8)
Alpha 1: 0.3 g/dL (ref 0.2–0.3)
Alpha 2: 0.7 g/dL (ref 0.5–0.9)
Beta 2: 0.4 g/dL (ref 0.2–0.5)
Beta Globulin: 0.4 g/dL (ref 0.4–0.6)
Gamma Globulin: 0.8 g/dL (ref 0.8–1.7)
Total Protein: 6.2 g/dL (ref 6.1–8.1)

## 2018-06-16 LAB — URIC ACID: Uric Acid, Serum: 7.5 mg/dL (ref 4.0–8.0)

## 2018-06-16 LAB — T4, FREE: Free T4: 1.1 ng/dL (ref 0.8–1.8)

## 2018-06-16 LAB — VITAMIN D 25 HYDROXY (VIT D DEFICIENCY, FRACTURES): Vit D, 25-Hydroxy: 30 ng/mL (ref 30–100)

## 2018-06-16 LAB — TSH: TSH: 2.94 mIU/L (ref 0.40–4.50)

## 2018-06-16 LAB — NO CULTURE INDICATED

## 2018-06-16 LAB — C-REACTIVE PROTEIN: CRP: 17 mg/L — ABNORMAL HIGH (ref <8.0)

## 2018-06-17 ENCOUNTER — Other Ambulatory Visit: Payer: Self-pay | Admitting: Family Medicine

## 2018-06-17 DIAGNOSIS — E790 Hyperuricemia without signs of inflammatory arthritis and tophaceous disease: Secondary | ICD-10-CM

## 2018-06-17 DIAGNOSIS — R809 Proteinuria, unspecified: Secondary | ICD-10-CM

## 2018-07-18 ENCOUNTER — Ambulatory Visit: Payer: Medicare HMO

## 2018-07-18 VITALS — BP 119/85 | HR 92 | Temp 97.7°F | Ht 68.0 in | Wt 269.0 lb

## 2018-07-18 DIAGNOSIS — Z0000 Encounter for general adult medical examination without abnormal findings: Secondary | ICD-10-CM

## 2018-07-18 NOTE — Patient Instructions (Signed)
Ricky Lee , Thank you for taking time to come for your Medicare Wellness Visit. I appreciate your ongoing commitment to your health goals. Please review the following plan we discussed and let me know if I can assist you in the future.   Screening recommendations/referrals: Colonoscopy: no longer required Recommended yearly ophthalmology/optometry visit for glaucoma screening and checkup Recommended yearly dental visit for hygiene and checkup  Vaccinations: Influenza vaccine: done 12/11/17 Pneumococcal vaccine: done 05/20/13 Tdap vaccine: done 2014 Shingles vaccine: Shingrix discussed. Please contact your pharmacy for coverage information.   Conditions/risks identified: Recommend increasing physical activity to 150 minutes per week.   Next appointment: Please follow up in one year for your Medicare Annual Wellness visit.    Preventive Care 40 Years and Older, Male Preventive care refers to lifestyle choices and visits with your health care provider that can promote health and wellness. What does preventive care include?  A yearly physical exam. This is also called an annual well check.  Dental exams once or twice a year.  Routine eye exams. Ask your health care provider how often you should have your eyes checked.  Personal lifestyle choices, including:  Daily care of your teeth and gums.  Regular physical activity.  Eating a healthy diet.  Avoiding tobacco and drug use.  Limiting alcohol use.  Practicing safe sex.  Taking low doses of aspirin every day.  Taking vitamin and mineral supplements as recommended by your health care provider. What happens during an annual well check? The services and screenings done by your health care provider during your annual well check will depend on your age, overall health, lifestyle risk factors, and family history of disease. Counseling  Your health care provider may ask you questions about your:  Alcohol use.  Tobacco use.   Drug use.  Emotional well-being.  Home and relationship well-being.  Sexual activity.  Eating habits.  History of falls.  Memory and ability to understand (cognition).  Work and work Statistician. Screening  You may have the following tests or measurements:  Height, weight, and BMI.  Blood pressure.  Lipid and cholesterol levels. These may be checked every 5 years, or more frequently if you are over 76 years old.  Skin check.  Lung cancer screening. You may have this screening every year starting at age 57 if you have a 30-pack-year history of smoking and currently smoke or have quit within the past 15 years.  Fecal occult blood test (FOBT) of the stool. You may have this test every year starting at age 66.  Flexible sigmoidoscopy or colonoscopy. You may have a sigmoidoscopy every 5 years or a colonoscopy every 10 years starting at age 51.  Prostate cancer screening. Recommendations will vary depending on your family history and other risks.  Hepatitis C blood test.  Hepatitis B blood test.  Sexually transmitted disease (STD) testing.  Diabetes screening. This is done by checking your blood sugar (glucose) after you have not eaten for a while (fasting). You may have this done every 1-3 years.  Abdominal aortic aneurysm (AAA) screening. You may need this if you are a current or former smoker.  Osteoporosis. You may be screened starting at age 30 if you are at high risk. Talk with your health care provider about your test results, treatment options, and if necessary, the need for more tests. Vaccines  Your health care provider may recommend certain vaccines, such as:  Influenza vaccine. This is recommended every year.  Tetanus, diphtheria, and acellular pertussis (  Tdap, Td) vaccine. You may need a Td booster every 10 years.  Zoster vaccine. You may need this after age 44.  Pneumococcal 13-valent conjugate (PCV13) vaccine. One dose is recommended after age 65.   Pneumococcal polysaccharide (PPSV23) vaccine. One dose is recommended after age 21. Talk to your health care provider about which screenings and vaccines you need and how often you need them. This information is not intended to replace advice given to you by your health care provider. Make sure you discuss any questions you have with your health care provider. Document Released: 03/12/2015 Document Revised: 11/03/2015 Document Reviewed: 12/15/2014 Elsevier Interactive Patient Education  2017 [place] Prevention in the Home Falls can cause injuries. They can happen to people of all ages. There are many things you can do to make your home safe and to help prevent falls. What can I do on the outside of my home?  Regularly fix the edges of walkways and driveways and fix any cracks.  Remove anything that might make you trip as you walk through a door, such as a raised step or threshold.  Trim any bushes or trees on the path to your home.  Use bright outdoor lighting.  Clear any walking paths of anything that might make someone trip, such as rocks or tools.  Regularly check to see if handrails are loose or broken. Make sure that both sides of any steps have handrails.  Any raised decks and porches should have guardrails on the edges.  Have any leaves, snow, or ice cleared regularly.  Use sand or salt on walking paths during winter.  Clean up any spills in your garage right away. This includes oil or grease spills. What can I do in the bathroom?  Use night lights.  Install grab bars by the toilet and in the tub and shower. Do not use towel bars as grab bars.  Use non-skid mats or decals in the tub or shower.  If you need to sit down in the shower, use a plastic, non-slip stool.  Keep the floor dry. Clean up any water that spills on the floor as soon as it happens.  Remove soap buildup in the tub or shower regularly.  Attach bath mats securely with double-sided non-slip  rug tape.  Do not have throw rugs and other things on the floor that can make you trip. What can I do in the bedroom?  Use night lights.  Make sure that you have a light by your bed that is easy to reach.  Do not use any sheets or blankets that are too big for your bed. They should not hang down onto the floor.  Have a firm chair that has side arms. You can use this for support while you get dressed.  Do not have throw rugs and other things on the floor that can make you trip. What can I do in the kitchen?  Clean up any spills right away.  Avoid walking on wet floors.  Keep items that you use a lot in easy-to-reach places.  If you need to reach something above you, use a strong step stool that has a grab bar.  Keep electrical cords out of the way.  Do not use floor polish or wax that makes floors slippery. If you must use wax, use non-skid floor wax.  Do not have throw rugs and other things on the floor that can make you trip. What can I do with my stairs?  Do  not leave any items on the stairs.  Make sure that there are handrails on both sides of the stairs and use them. Fix handrails that are broken or loose. Make sure that handrails are as long as the stairways.  Check any carpeting to make sure that it is firmly attached to the stairs. Fix any carpet that is loose or worn.  Avoid having throw rugs at the top or bottom of the stairs. If you do have throw rugs, attach them to the floor with carpet tape.  Make sure that you have a light switch at the top of the stairs and the bottom of the stairs. If you do not have them, ask someone to add them for you. What else can I do to help prevent falls?  Wear shoes that:  Do not have high heels.  Have rubber bottoms.  Are comfortable and fit you well.  Are closed at the toe. Do not wear sandals.  If you use a stepladder:  Make sure that it is fully opened. Do not climb a closed stepladder.  Make sure that both sides of  the stepladder are locked into place.  Ask someone to hold it for you, if possible.  Clearly mark and make sure that you can see:  Any grab bars or handrails.  First and last steps.  Where the edge of each step is.  Use tools that help you move around (mobility aids) if they are needed. These include:  Canes.  Walkers.  Scooters.  Crutches.  Turn on the lights when you go into a dark area. Replace any light bulbs as soon as they burn out.  Set up your furniture so you have a clear path. Avoid moving your furniture around.  If any of your floors are uneven, fix them.  If there are any pets around you, be aware of where they are.  Review your medicines with your doctor. Some medicines can make you feel dizzy. This can increase your chance of falling. Ask your doctor what other things that you can do to help prevent falls. This information is not intended to replace advice given to you by your health care provider. Make sure you discuss any questions you have with your health care provider. Document Released: 12/10/2008 Document Revised: 07/22/2015 Document Reviewed: 03/20/2014 Elsevier Interactive Patient Education  2017 [employer].

## 2018-07-18 NOTE — Progress Notes (Signed)
Subjective:   Ricky Lee is a 85 y.o. male who presents for Medicare Annual/Subsequent preventive examination.  Virtual Visit via Telephone Note  I connected with Ricky Lee on 07/18/18 at  8:40 AM EDT by telephone and verified that I am speaking with the correct person using two identifiers.  Medicare Annual Wellness visit completed telephonically due to Covid-19 pandemic.   Location: Patient: home Provider: office   I discussed the limitations, risks, security and privacy concerns of performing an evaluation and management service by telephone and the availability of in person appointments.The patient expressed understanding and agreed to proceed.  Some vital signs may be absent or patient reported.     Clemetine Marker, LPN    Review of Systems:   Cardiac Risk Factors include: advanced age (>4men, >64 women);hypertension;dyslipidemia;male gender;obesity (BMI >30kg/m2);sedentary lifestyle     Objective:    Vitals: BP 119/85   Pulse 92   Temp 97.7 F (36.5 C) (Oral)   Ht 5\' 8"  (1.727 m)   Wt 269 lb (122 kg)   BMI 40.90 kg/m   Body mass index is 40.9 kg/m.  Advanced Directives 07/18/2018 07/12/2017 09/07/2016 06/08/2016 02/04/2016 11/15/2015 07/16/2015  Does Patient Have a Medical Advance Directive? Yes Yes Yes Yes Yes Yes Yes  Type of Advance Directive Living will;Healthcare Power of [place];Living will - - [place];Living will Living will Living will  Does patient want to make changes to medical advance directive? - - - - Yes (Inpatient - patient defers changing a medical advance directive at this time) - -  Copy of [place] in Chart? Yes - validated most recent copy scanned in chart (See row information) Yes - - Yes No - copy requested No - copy requested  Pre-existing out of facility DNR order (yellow form or pink MOST form) - - - - Yellow form placed in chart (order not valid for inpatient use)  - -    Tobacco Social History   Tobacco Use  Smoking Status Former Smoker  . Packs/day: 2.00  . Years: 29.00  . Pack years: 58.00  . Types: Cigarettes  . Last attempt to quit: 04/07/1982  . Years since quitting: 36.3  Smokeless Tobacco Never Used  Tobacco Comment   smoking cessation materials not required     Counseling given: Not Answered Comment: smoking cessation materials not required   Clinical Intake:  Pre-visit preparation completed: Yes  Pain : No/denies pain     BMI - recorded: 40.9 Nutritional Status: BMI > 30  Obese Nutritional Risks: None Diabetes: No  How often do you need to have someone help you when you read instructions, pamphlets, or other written materials from your doctor or pharmacy?: 1 - Never  Interpreter Needed?: No  Information entered by :: Clemetine Marker LPN  Past Medical History:  Diagnosis Date  . Allergy   . Arthritis   . Cancer ([place])    oral cancer in past, skin cancer  . Complication of anesthesia    last surgery 02/04/16- confusion "lost a week"  . Coronary artery disease   . Degenerative lumbar disc   . Dyspnea    with exertion  . Hyperlipemia   . Hypertension   . Myocardial infarction ([place]) 1984  . OA (osteoarthritis)    ankle/foot/knee/shoulder  . Osteoporosis 05/05/2015  . Trigger little finger of left hand   . Vitamin D deficiency 05/05/2015   Past Surgical History:  Procedure Laterality Date  .  BICEPS TENDON REPAIR     left  . CARDIAC CATHETERIZATION    . CHOLECYSTECTOMY    . COLONOSCOPY W/ POLYPECTOMY    . CORONARY ANGIOPLASTY  1984  . EYE SURGERY Bilateral    cataracts  . IRRIGATION AND DEBRIDEMENT SHOULDER  02/09/2012   Procedure: IRRIGATION AND DEBRIDEMENT SHOULDER;  Surgeon: Augustin Schooling, MD;  Location: [facility];  Service: Orthopedics;  Laterality: Right;  Right Shoulder I&D with Poly Exchange, Removal of 42 +9 Standard Poly spacer  . JOINT REPLACEMENT  1999   right knee  . JOINT REPLACEMENT  2014   right  shoulder  . LEG SURGERY     right and leg  . REVERSE SHOULDER ARTHROPLASTY Left 02/11/2016   Procedure: LEFT REVERSE TOTAL SHOULDER ARTHROPLASTY;  Surgeon: Netta Cedars, MD;  Location: [facility];  Service: Orthopedics;  Laterality: Left;  . REVERSE TOTAL SHOULDER ARTHROPLASTY Left 02/11/2016   Dr. [name]  . right foot surgery     bone removed  . ROTATOR CUFF REPAIR Right    right  . SKIN CANCER EXCISION     nose  . TONGUE SURGERY     removal of cancer  . TONSILLECTOMY    . TOTAL KNEE ARTHROPLASTY Left 04/17/2014   Procedure: LEFT TOTAL KNEE ARTHROPLASTY;  Surgeon: Augustin Schooling, MD;  Location: [facility];  Service: Orthopedics;  Laterality: Left;  . TOTAL SHOULDER ARTHROPLASTY  01/19/2012   Procedure: TOTAL SHOULDER ARTHROPLASTY;  Surgeon: Augustin Schooling, MD;  Location: [facility];  Service: Orthopedics;  Laterality: Right;  REVERSE TOTAL SHOULDER   Family History  Problem Relation Age of Onset  . Diverticulitis Mother   . Alzheimer's disease Father   . Cancer Brother        mesothelioma  . Heart disease Brother   . Hypertension Brother   . Cancer Maternal Grandmother        male  . Cancer Paternal Grandmother   . COPD Neg Hx   . Diabetes Neg Hx   . Stroke Neg Hx    Social History   Socioeconomic History  . Marital status: Married    Spouse name: Nunzio Cory  . Number of children: 4  . Years of education: Not on file  . Highest education level: 12th grade  Occupational History  . Occupation: Retired  Scientific laboratory technician  . Financial resource strain: Not hard at all  . Food insecurity:    Worry: Never true    Inability: Never true  . Transportation needs:    Medical: No    Non-medical: No  Tobacco Use  . Smoking status: Former Smoker    Packs/day: 2.00    Years: 29.00    Pack years: 58.00    Types: Cigarettes    Last attempt to quit: 04/07/1982    Years since quitting: 36.3  . Smokeless tobacco: Never Used  . Tobacco comment: smoking cessation materials not required   Substance and Sexual Activity  . Alcohol use: Yes    Comment: occasional- beer  . Drug use: No  . Sexual activity: Not Currently  Lifestyle  . Physical activity:    Days per week: 2 days    Minutes per session: 30 min  . Stress: Not at all  Relationships  . Social connections:    Talks on phone: More than three times a week    Gets together: Twice a week    Attends religious service: Never    Active member of club or organization: No  Attends meetings of clubs or organizations: Never    Relationship status: Married  Other Topics Concern  . Not on file  Social History Narrative  . Not on file    Outpatient Encounter Medications as of 07/18/2018  Medication Sig  . aspirin 325 MG tablet Take 325 mg by mouth daily.  . cetirizine (ZYRTEC) 5 MG tablet Take 5 mg by mouth daily.  Marland Kitchen lisinopril (PRINIVIL,ZESTRIL) 5 MG tablet Take 1 tablet (5 mg total) by mouth daily.  . metoprolol tartrate (LOPRESSOR) 25 MG tablet Take 1 tablet (25 mg total) by mouth 2 (two) times daily.  . Multiple Vitamin (MULTIVITAMIN) tablet Take 1 tablet by mouth daily.  . pravastatin (PRAVACHOL) 40 MG tablet Take 1 tablet (40 mg total) by mouth at bedtime.  . [DISCONTINUED] albuterol (PROVENTIL HFA;VENTOLIN HFA) 108 (90 Base) MCG/ACT inhaler Inhale 2 puffs into the lungs every 4 (four) hours as needed.  . [DISCONTINUED] Vitamin D, Ergocalciferol, (DRISDOL) 50000 units CAPS capsule Take by mouth.   No facility-administered encounter medications on file as of 07/18/2018.     Activities of Daily Living In your present state of health, do you have any difficulty performing the following activities: 07/18/2018 06/12/2018  Hearing? N N  Vision? N N  Comment reading glases -  Difficulty concentrating or making decisions? N N  Walking or climbing stairs? N N  Dressing or bathing? N N  Doing errands, shopping? N N  Preparing Food and eating ? N -  Using the Toilet? N -  In the past six months, have you accidently  leaked urine? N -  Do you have problems with loss of bowel control? N -  Managing your Medications? N -  Managing your Finances? N -  Housekeeping or managing your Housekeeping? N -  Some recent data might be hidden    Patient Care Team: Lada, Satira Anis, MD as PCP - General (Family Medicine) Park Liter, MD as Referring Physician (Cardiology) Erby Pian, MD as Consulting Physician (Specialist) Netta Cedars, MD as Consulting Physician (Orthopedic Surgery) Corey Skains, MD as Consulting Physician (Cardiology)   Assessment:   This is a routine wellness examination for Ricky Lee.  Exercise Activities and Dietary recommendations Current Exercise Habits: Home exercise routine, Type of exercise: calisthenics, Time (Minutes): 30, Frequency (Times/Week): 2, Weekly Exercise (Minutes/Week): 60, Intensity: Mild, Exercise limited by: orthopedic condition(s)  Goals    . Exercise 150 min/wk Moderate Activity     Recommend to exercise for at least 150 minutes per week.       Fall Risk Fall Risk  07/18/2018 06/12/2018 07/12/2017 03/13/2017 09/07/2016  Falls in the past year? 0 0 Yes No No  Comment - - rolled out of bed - -  Number falls in past yr: 1 - 1 - -  Injury with Fall? 0 - No - -  Risk for fall due to : - - History of fall(s);Impaired vision - -  Risk for fall due to: Comment - - wears reading glasses - -  Follow up Falls prevention discussed - Falls evaluation completed;Education provided;Falls prevention discussed - -   FALL RISK PREVENTION PERTAINING TO THE HOME:  Any stairs in or around the home? Yes  If so, do they handrails? Yes   Home free of loose throw rugs in walkways, pet beds, electrical cords, etc? Yes  Adequate lighting in your home to reduce risk of falls? Yes   ASSISTIVE DEVICES UTILIZED TO PREVENT FALLS:  Life alert? No  Use of a cane, walker or w/c? No  Grab bars in the bathroom? Yes  Shower chair or bench in shower? No  Elevated toilet seat or  a handicapped toilet? No   DME ORDERS:  DME order needed?  No   TIMED UP AND GO:  Was the test performed? No . Telephonic visit.   Education: Fall risk prevention has been discussed.  Intervention(s) required? No    Depression Screen PHQ 2/9 Scores 07/18/2018 06/12/2018 07/12/2017 03/13/2017  PHQ - 2 Score 2 4 2  0  PHQ- 9 Score 5 10 5  -    Cognitive Function     6CIT Screen 07/18/2018 07/12/2017  What Year? 0 points 0 points  What month? 0 points 0 points  What time? 0 points 3 points  Count back from 20 0 points 0 points  Months in reverse 0 points 0 points  Repeat phrase 2 points 0 points  Total Score 2 3    Immunization History  Administered Date(s) Administered  . Influenza, High Dose Seasonal PF 11/15/2015, 11/30/2016, 12/11/2017  . Influenza-Unspecified 11/13/2013, 01/11/2015, 11/30/2016  . Pneumococcal Conjugate-13 05/20/2013  . Pneumococcal Polysaccharide-23 02/28/2007  . Td 02/28/2012  . Zoster 12/10/2013    Qualifies for Shingles Vaccine? Yes  Zostavax completed 2015. Due for Shingrix. Education has been provided regarding the importance of this vaccine. Pt has been advised to call insurance company to determine out of pocket expense. Advised may also receive vaccine at local pharmacy or Health Dept. Verbalized acceptance and understanding.  Tdap: Up to date  Flu Vaccine: Up to date  Pneumococcal Vaccine: Up to date   Screening Tests Health Maintenance  Topic Date Due  . INFLUENZA VACCINE  09/28/2018  . TETANUS/TDAP  02/27/2022  . PNA vac Low Risk Adult  Completed   Cancer Screenings:  Colorectal Screening:No longer required.   Lung Cancer Screening: (Low Dose CT Chest recommended if Age 67-80 years, 30 pack-year currently smoking OR have quit w/in 15years.) does not qualify.   Additional Screening:  Hepatitis C Screening: no longer required  Vision Screening: Recommended annual ophthalmology exams for early detection of glaucoma and other  disorders of the eye. Is the patient up to date with their annual eye exam?  Yes  Who is the provider or what is the name of the office in which the pt attends annual eye exams? [place] Screening: Recommended annual dental exams for proper oral hygiene  Community Resource Referral:  CRR required this visit?  No       Plan:    I have personally reviewed and addressed the Medicare Annual Wellness questionnaire and have noted the following in the patient's chart:  A. Medical and social history B. Use of alcohol, tobacco or illicit drugs  C. Current medications and supplements D. Functional ability and status E.  Nutritional status F.  Physical activity G. Advance directives H. List of other physicians I.  Hospitalizations, surgeries, and ER visits in previous 12 months J.  [place] such as hearing and vision if needed, cognitive and depression L. Referrals and appointments   In addition, I have reviewed and discussed with patient certain preventive protocols, quality metrics, and best practice recommendations. A written personalized care plan for preventive services as well as general preventive health recommendations were provided to patient.   Signed,  Clemetine Marker, LPN Nurse Health Advisor   Nurse Notes: none

## 2018-08-01 ENCOUNTER — Telehealth: Payer: Self-pay

## 2018-08-01 NOTE — Telephone Encounter (Signed)
Returned patients call and left detailed VM.   Copied from [place] [phone]. Topic: General - Other >> Jul 30, 2018  9:16 AM Antonieta Iba C wrote: Reason for CRM:   Pt says that he was suppose to have a year supply on 3 medications.   lisinopril (PRINIVIL,ZESTRIL) 5 MG tablet metoprolol tartrate (LOPRESSOR) 25 MG tablet pravastatin (PRAVACHOL) 40 MG tablet   Pharmacy: [facility]  Pt is requesting a call back to discuss further.

## 2018-09-11 ENCOUNTER — Ambulatory Visit: Payer: Medicare HMO | Admitting: Family Medicine

## 2018-10-10 ENCOUNTER — Other Ambulatory Visit: Payer: Self-pay

## 2018-10-10 ENCOUNTER — Ambulatory Visit (INDEPENDENT_AMBULATORY_CARE_PROVIDER_SITE_OTHER): Payer: Medicare HMO | Admitting: Nurse Practitioner

## 2018-10-10 ENCOUNTER — Encounter: Payer: Self-pay | Admitting: Nurse Practitioner

## 2018-10-10 VITALS — BP 131/75 | HR 87 | Ht 68.0 in | Wt 273.4 lb

## 2018-10-10 DIAGNOSIS — I872 Venous insufficiency (chronic) (peripheral): Secondary | ICD-10-CM

## 2018-10-10 DIAGNOSIS — Z7689 Persons encountering health services in other specified circumstances: Secondary | ICD-10-CM

## 2018-10-10 DIAGNOSIS — M810 Age-related osteoporosis without current pathological fracture: Secondary | ICD-10-CM

## 2018-10-10 DIAGNOSIS — E782 Mixed hyperlipidemia: Secondary | ICD-10-CM

## 2018-10-10 DIAGNOSIS — M150 Primary generalized (osteo)arthritis: Secondary | ICD-10-CM

## 2018-10-10 DIAGNOSIS — I10 Essential (primary) hypertension: Secondary | ICD-10-CM

## 2018-10-10 DIAGNOSIS — I252 Old myocardial infarction: Secondary | ICD-10-CM

## 2018-10-10 DIAGNOSIS — M8949 Other hypertrophic osteoarthropathy, multiple sites: Secondary | ICD-10-CM

## 2018-10-10 DIAGNOSIS — Z6841 Body Mass Index (BMI) 40.0 and over, adult: Secondary | ICD-10-CM

## 2018-10-10 DIAGNOSIS — M159 Polyosteoarthritis, unspecified: Secondary | ICD-10-CM

## 2018-10-10 MED ORDER — METOPROLOL TARTRATE 25 MG PO TABS: 25.0000 mg | ORAL_TABLET | Freq: Two times a day (BID) | ORAL | 3 refills | Status: AC

## 2018-10-10 MED ORDER — PRAVASTATIN SODIUM 40 MG PO TABS: 40.0000 mg | ORAL_TABLET | Freq: Every day | ORAL | 3 refills | Status: AC

## 2018-10-10 MED ORDER — LISINOPRIL 5 MG PO TABS: 5.0000 mg | ORAL_TABLET | Freq: Every day | ORAL | 3 refills | Status: AC

## 2018-10-10 NOTE — Progress Notes (Signed)
Subjective:    Patient ID: Ricky Lee, male    DOB: [birth year], 85 y.o.   MRN: [id number]  Ricky Lee Pain is a 85 y.o. male presenting on 10/10/2018 for Establish Care (the pt mentions that his bp might be elevated because his son passed away this week and his brother earlier this month. )  HPI [place] Provider Pt last seen by PCP Dr. Sanda Klein approx 3 months ago.  Obtain records from [facility].    Osteoporosis Patient declines bone density scan - osteoporosis documented, but status is not available.  No recent DEXA found in chart.  Pedal edema Patient's last dose torsemide - never - keeps it on hand, but never needed it.  He  Continues to have regular pedal edema, but without pain or bothersome symptoms otherwise.  Arthritis Walking limited - due to lack of desire and possibly due to pain.  Patient states he does walk his puppy occasionally.  CAD/Hx MI Patient continues on statin therapy as repeat ASCVD event prevention. Tolerates without side effects.  Pt denies changes in vision, chest tightness/pressure, palpitations, shortness of breath, leg pain while walking, leg or arm weakness, and sudden loss of speech or loss of consciousness since his last medical exam.  Past Medical History:  Diagnosis Date  . Allergy   . Arthritis   . Cancer ([place])    oral cancer in past, skin cancer  . Complication of anesthesia    last surgery 02/04/16- confusion "lost a week"  . Coronary artery disease   . Degenerative lumbar disc   . Dyspnea    with exertion  . Hyperlipemia   . Hypertension   . Myocardial infarction ([place]) 1984  . OA (osteoarthritis)    ankle/foot/knee/shoulder  . Osteoporosis 05/05/2015  . Trigger little finger of left hand   . Vitamin D deficiency 05/05/2015   Past Surgical History:  Procedure Laterality Date  . BICEPS TENDON REPAIR     left  . CARDIAC CATHETERIZATION    . CHOLECYSTECTOMY    . COLONOSCOPY W/ POLYPECTOMY    . CORONARY ANGIOPLASTY  1984  . EYE  SURGERY Bilateral    cataracts  . IRRIGATION AND DEBRIDEMENT SHOULDER  02/09/2012   Procedure: IRRIGATION AND DEBRIDEMENT SHOULDER;  Surgeon: Augustin Schooling, MD;  Location: [place];  Service: Orthopedics;  Laterality: Right;  Right Shoulder I&D with Poly Exchange, Removal of 42 +9 Standard Poly spacer  . JOINT REPLACEMENT  1999   right knee  . JOINT REPLACEMENT  2014   right shoulder  . LEG SURGERY     right and leg  . REVERSE SHOULDER ARTHROPLASTY Left 02/11/2016   Procedure: LEFT REVERSE TOTAL SHOULDER ARTHROPLASTY;  Surgeon: Netta Cedars, MD;  Location: [place];  Service: Orthopedics;  Laterality: Left;  . REVERSE TOTAL SHOULDER ARTHROPLASTY Left 02/11/2016   Dr. [name]  . right foot surgery     bone removed  . ROTATOR CUFF REPAIR Right    right  . SKIN CANCER EXCISION     nose  . TONGUE SURGERY     removal of cancer  . TONSILLECTOMY    . TOTAL KNEE ARTHROPLASTY Left 04/17/2014   Procedure: LEFT TOTAL KNEE ARTHROPLASTY;  Surgeon: Augustin Schooling, MD;  Location: [place];  Service: Orthopedics;  Laterality: Left;  . TOTAL SHOULDER ARTHROPLASTY  01/19/2012   Procedure: TOTAL SHOULDER ARTHROPLASTY;  Surgeon: Augustin Schooling, MD;  Location: [place];  Service: Orthopedics;  Laterality: Right;  REVERSE TOTAL SHOULDER  Social History   Socioeconomic History  . Marital status: Married    Spouse name: Nunzio Cory  . Number of children: 4  . Years of education: Not on file  . Highest education level: 12th grade  Occupational History  . Occupation: Retired  Scientific laboratory technician  . Financial resource strain: Not hard at all  . Food insecurity    Worry: Never true    Inability: Never true  . Transportation needs    Medical: No    Non-medical: No  Tobacco Use  . Smoking status: Former Smoker    Packs/day: 2.00    Years: 29.00    Pack years: 58.00    Types: Cigarettes    Quit date: 04/07/1982    Years since quitting: 36.5  . Smokeless tobacco: Never Used  . Tobacco comment: smoking cessation  materials not required  Substance and Sexual Activity  . Alcohol use: Not Currently    Comment: occasional- beer  . Drug use: No  . Sexual activity: Not Currently  Lifestyle  . Physical activity    Days per week: 2 days    Minutes per session: 30 min  . Stress: Not at all  Relationships  . Social connections    Talks on phone: More than three times a week    Gets together: Twice a week    Attends religious service: Never    Active member of club or organization: No    Attends meetings of clubs or organizations: Never    Relationship status: Married  . Intimate partner violence    Fear of current or ex partner: No    Emotionally abused: No    Physically abused: No    Forced sexual activity: No  Other Topics Concern  . Not on file  Social History Narrative  . Not on file   Family History  Problem Relation Age of Onset  . Diverticulitis Mother   . Alzheimer's disease Father   . Cancer Brother        mesothelioma  . Heart disease Brother   . Hypertension Brother   . Cancer Maternal Grandmother        male  . Cancer Paternal Grandmother   . COPD Neg Hx   . Diabetes Neg Hx   . Stroke Neg Hx    Current Outpatient Medications on File Prior to Visit  Medication Sig  . aspirin 325 MG tablet Take 325 mg by mouth daily.  . cetirizine (ZYRTEC) 5 MG tablet Take 5 mg by mouth daily.  Marland Kitchen lisinopril (PRINIVIL,ZESTRIL) 5 MG tablet Take 1 tablet (5 mg total) by mouth daily.  . metoprolol tartrate (LOPRESSOR) 25 MG tablet Take 1 tablet (25 mg total) by mouth 2 (two) times daily.  . Multiple Vitamin (MULTIVITAMIN) tablet Take 1 tablet by mouth daily.  . pravastatin (PRAVACHOL) 40 MG tablet Take 1 tablet (40 mg total) by mouth at bedtime.  . torsemide (DEMADEX) 10 MG tablet TAKE 1 TABLET EVERY MORNING AS NEEDED   No current facility-administered medications on file prior to visit.     Review of Systems  Constitutional: Negative for activity change, appetite change, fatigue and  unexpected weight change.  HENT: Negative for congestion, hearing loss and trouble swallowing.   Eyes: Negative for visual disturbance.  Respiratory: Negative for choking, shortness of breath and wheezing.   Cardiovascular: Positive for leg swelling. Negative for chest pain and palpitations.  Gastrointestinal: Negative for abdominal pain, blood in stool, constipation and diarrhea.  Endocrine: Negative for polydipsia,  polyphagia and polyuria.  Genitourinary: Negative for difficulty urinating.  Musculoskeletal: Positive for arthralgias. Negative for back pain and myalgias.  Skin: Negative for color change, rash and wound.  Allergic/Immunologic: Negative for environmental allergies.  Neurological: Negative for dizziness, seizures, weakness and headaches.  Psychiatric/Behavioral: Negative for behavioral problems, decreased concentration, dysphoric mood, sleep disturbance and suicidal ideas. The patient is not nervous/anxious.        Mild anhedonia - recent grief process started for loss of his brother and his son this past week.   Per HPI unless specifically indicated above     Objective:    BP 131/75 (BP Location: Right Arm, Patient Position: Sitting, Cuff Size: Normal)   Pulse 87   Ht 5\' 8"  (1.727 m)   Wt 273 lb 6.4 oz (124 kg)   SpO2 97%   BMI 41.57 kg/m   Wt Readings from Last 3 Encounters:  10/10/18 273 lb 6.4 oz (124 kg)  07/18/18 269 lb (122 kg)  07/12/17 275 lb 8 oz (125 kg)    Physical Exam Vitals signs reviewed.  Constitutional:      General: He is awake. He is not in acute distress.    Appearance: Normal appearance. He is well-developed. He is obese.  HENT:     Head: Normocephalic and atraumatic.     Right Ear: Tympanic membrane, ear canal and external ear normal.     Left Ear: Tympanic membrane, ear canal and external ear normal.     Nose: Nose normal.     Mouth/Throat:     Mouth: Mucous membranes are moist.     Pharynx: Oropharynx is clear.  Neck:      Musculoskeletal: Normal range of motion and neck supple.     Vascular: No carotid bruit.  Cardiovascular:     Rate and Rhythm: Normal rate and regular rhythm.     Pulses:          Radial pulses are 2+ on the right side and 2+ on the left side.       Posterior tibial pulses are 1+ on the right side and 1+ on the left side.     Heart sounds: Normal heart sounds, S1 normal and S2 normal.  Pulmonary:     Effort: Pulmonary effort is normal. No respiratory distress.     Breath sounds: Normal breath sounds and air entry.  Abdominal:     General: Bowel sounds are normal.     Palpations: Abdomen is soft.  Musculoskeletal:     Right lower leg: Edema (+4 pitting) present.     Left lower leg: Edema (+4 pitting) present.  Skin:    General: Skin is warm and dry.     Capillary Refill: Capillary refill takes less than 2 seconds.  Neurological:     General: No focal deficit present.     Mental Status: He is alert and oriented to person, place, and time.  Psychiatric:        Attention and Perception: Attention normal.        Mood and Affect: Mood and affect normal.        Behavior: Behavior normal. Behavior is cooperative.        Thought Content: Thought content normal.        Judgment: Judgment normal.    Results for orders placed or performed in visit on 06/12/18  C-reactive protein  Result Value Ref Range   CRP 17.0 (H) <8.0 mg/L  TSH  Result Value Ref Range  TSH 2.94 0.40 - 4.50 mIU/L  T4, free  Result Value Ref Range   Free T4 1.1 0.8 - 1.8 ng/dL  CBC with Differential/Platelet  Result Value Ref Range   WBC 7.6 3.8 - 10.8 Thousand/uL   RBC 4.60 4.20 - 5.80 Million/uL   Hemoglobin 14.8 13.2 - 17.1 g/dL   HCT 43.9 38.5 - 50.0 %   MCV 95.4 80.0 - 100.0 fL   MCH 32.2 27.0 - 33.0 pg   MCHC 33.7 32.0 - 36.0 g/dL   RDW 12.1 11.0 - 15.0 %   Platelets 169 140 - 400 Thousand/uL   MPV 10.2 7.5 - 12.5 fL   Neutro Abs 4,735 1,500 - 7,800 cells/uL   Lymphs Abs 1,923 850 - 3,900 cells/uL    Absolute Monocytes 790 200 - 950 cells/uL   Eosinophils Absolute 122 15 - 500 cells/uL   Basophils Absolute 30 0 - 200 cells/uL   Neutrophils Relative % 62.3 %   Total Lymphocyte 25.3 %   Monocytes Relative 10.4 %   Eosinophils Relative 1.6 %   Basophils Relative 0.4 %  COMPLETE METABOLIC PANEL WITH GFR  Result Value Ref Range   Glucose, Bld 92 65 - 99 mg/dL   BUN 25 7 - 25 mg/dL   Creat 1.28 (H) 0.70 - 1.11 mg/dL   GFR, Est Non African American 51 (L) > OR = 60 mL/min/1.5m2   GFR, Est African American 60 > OR = 60 mL/min/1.73m2   BUN/Creatinine Ratio 20 6 - 22 (calc)   Sodium 140 135 - 146 mmol/L   Potassium 4.8 3.5 - 5.3 mmol/L   Chloride 105 98 - 110 mmol/L   CO2 26 20 - 32 mmol/L   Calcium 9.1 8.6 - 10.3 mg/dL   Total Protein 6.3 6.1 - 8.1 g/dL   Albumin 3.8 3.6 - 5.1 g/dL   Globulin 2.5 1.9 - 3.7 g/dL (calc)   AG Ratio 1.5 1.0 - 2.5 (calc)   Total Bilirubin 0.8 0.2 - 1.2 mg/dL   Alkaline phosphatase (APISO) 57 35 - 144 U/L   AST 23 10 - 35 U/L   ALT 15 9 - 46 U/L  Protein,Total and Elect and IFE,24  Result Value Ref Range   Albumin  %   Alpha-1-Globulin, U NOTE %   Alpha-2-Globulin, U NOTE %   Beta Globulin, U NOTE %   Gamma Globulin, U NOTE %   Interpretation     Interpretation     Creatinine, 24H Ur 2.04 0.50 - 2.15 g/24 h   PROTEIN/CREATININE RATIO 103 < OR = 114 mg/g creat    PROTEIN/CREATININE RATIO 0.103 < OR = 0.1 mg/mg creat   Protein, 24H Urine 210 (H) 0 - 149 mg/24 h  Protein Electrophoresis, (serum)  Result Value Ref Range   Total Protein 6.2 6.1 - 8.1 g/dL   Albumin ELP 3.6 (L) 3.8 - 4.8 g/dL   Alpha 1 0.3 0.2 - 0.3 g/dL   Alpha 2 0.7 0.5 - 0.9 g/dL   Beta Globulin 0.4 0.4 - 0.6 g/dL   Beta 2 0.4 0.2 - 0.5 g/dL   Gamma Globulin 0.8 0.8 - 1.7 g/dL   Abnormal Protein Band1 NOTE NONE DETEC g/dL   SPE Interp.    ANA,IFA RA Diag Pnl w/rflx Tit/Patn  Result Value Ref Range   Anti Nuclear Antibody (ANA) POSITIVE (A) NEGATIVE   Rhuematoid fact  SerPl-aCnc <47 <82 IU/mL   Cyclic Citrullin Peptide Ab <16 UNITS   INTERPRETATION  Urinalysis w microscopic + reflex cultur   Specimen: Urine  Result Value Ref Range   Color, Urine YELLOW YELLOW   APPearance CLEAR CLEAR   Specific Gravity, Urine 1.020 1.001 - 1.03   pH < OR = 5.0 5.0 - 8.0   Glucose, UA NEGATIVE NEGATIVE   Bilirubin Urine NEGATIVE NEGATIVE   Ketones, ur NEGATIVE NEGATIVE   Hgb urine dipstick NEGATIVE NEGATIVE   Protein, ur NEGATIVE NEGATIVE   Nitrites, Initial NEGATIVE NEGATIVE   Leukocyte Esterase NEGATIVE NEGATIVE   WBC, UA NONE SEEN 0 - 5 /HPF   RBC / HPF NONE SEEN 0 - 2 /HPF   Squamous Epithelial / LPF NONE SEEN < OR = 5 /HPF   Bacteria, UA NONE SEEN NONE SEEN /HPF   Hyaline Cast NONE SEEN NONE SEEN /LPF  Lipid panel  Result Value Ref Range   Cholesterol 195 <200 mg/dL   HDL 62 > OR = 40 mg/dL   Triglycerides 88 <150 mg/dL   LDL Cholesterol (Calc) 114 (H) mg/dL (calc)   Total CHOL/HDL Ratio 3.1 <5.0 (calc)   Non-HDL Cholesterol (Calc) 133 (H) <130 mg/dL (calc)  VITAMIN D 25 Hydroxy (Vit-D Deficiency, Fractures)  Result Value Ref Range   Vit D, 25-Hydroxy 30 30 - 100 ng/mL  Hemoglobin A1c  Result Value Ref Range   Hgb A1c MFr Bld 5.8 (H) <5.7 % of total Hgb   Mean Plasma Glucose 120 (calc)   eAG (mmol/L) 6.6 (calc)  REFLEXIVE URINE CULTURE  Result Value Ref Range   Reflexve Urine Culture NO CULTURE INDICATED   Uric acid  Result Value Ref Range   Uric Acid, Serum 7.5 4.0 - 8.0 mg/dL  TEST AUTHORIZATION  Result Value Ref Range   TEST NAME: URIC ACID    TEST CODE: [id number]    CLIENT CONTACT: LADA,MELINDA    REPORT ALWAYS MESSAGE SIGNATURE    Anti-nuclear ab-titer (ANA titer)  Result Value Ref Range   ANA Titer 1 1:40 (H) titer   ANA Pattern 1 Nuclear, Homogeneous (A)       Assessment & Plan:   Problem List Items Addressed This Visit      Cardiovascular and Mediastinum   Essential hypertension, benign - Primary (Chronic) Uncontrolled  today, complicated by increased stress/grief.  Keep medications stable for now.  Check BP at home.  Call clinic if BP regularly > 130/80.  Take torsemide for increased swelling for BP control.  Follow-up 3-6 months.   Relevant Medications   torsemide (DEMADEX) 10 MG tablet   lisinopril (ZESTRIL) 5 MG tablet   metoprolol tartrate (LOPRESSOR) 25 MG tablet   pravastatin (PRAVACHOL) 40 MG tablet   Old inferior wall myocardial infarction Persistent hyperlipidemia, CAD without angina.  Stable symptoms without new ASCVD events. Continue beta blocker for hypertension, cardioprotection.  Continue statin therapy for reducing future risk ASCVD events.  Discussed intensification of statin.  Patient refuses at this time.  Notes no ASCVD events in last 20 years.  Follow-up 6 months with labs.   Relevant Medications   torsemide (DEMADEX) 10 MG tablet   lisinopril (ZESTRIL) 5 MG tablet   metoprolol tartrate (LOPRESSOR) 25 MG tablet   pravastatin (PRAVACHOL) 40 MG tablet   Venous insufficiency of both lower extremities Currently uncontrolled today with +4 pedal edema and stasis dermatitis.  Encouraged use of compression socks.  Follow-up 6 months and if any complications arise.   Relevant Medications   torsemide (DEMADEX) 10 MG tablet   lisinopril (ZESTRIL) 5 MG tablet  metoprolol tartrate (LOPRESSOR) 25 MG tablet   pravastatin (PRAVACHOL) 40 MG tablet     Musculoskeletal and Integument   Osteoarthritis (Chronic) Stable.  Continue acetaminophen and rare NSAID for pain control.  Follow-up prn and 6 months.    Osteoporosis Status unknown.  Recheck DEXA recommended.  Patient refuses today.  Continue weight bearing exercise, regular intake of dietary calcium.  Follow-up 6 months.      Other   Hyperlipidemia (Chronic) See Old MI above   Relevant Medications   torsemide (DEMADEX) 10 MG tablet   lisinopril (ZESTRIL) 5 MG tablet   metoprolol tartrate (LOPRESSOR) 25 MG tablet   pravastatin (PRAVACHOL) 40  MG tablet   Morbid obesity with BMI of 40.0-44.9, adult (HCC) Uncontrolled, severe with comorbidities of osteoarthritis and CAD/hyperlipidemia. Encouraged increasing activity, controlling diet. Follow-up 6 months.    Other Visit Diagnoses    Encounter to establish care     Previous PCP was at Dr. Sanda Klein.  Records are reviewed today.  Past medical, family, and surgical history reviewed w/ pt.        Meds ordered this encounter  Medications  . lisinopril (ZESTRIL) 5 MG tablet    Sig: Take 1 tablet (5 mg total) by mouth daily.    Dispense:  90 tablet    Refill:  3    Order Specific Question:   Supervising Provider    Answer:   Olin Hauser [[id number]]  . metoprolol tartrate (LOPRESSOR) 25 MG tablet    Sig: Take 1 tablet (25 mg total) by mouth 2 (two) times daily.    Dispense:  180 tablet    Refill:  3    Order Specific Question:   Supervising Provider    Answer:   Olin Hauser [[id number]]  . pravastatin (PRAVACHOL) 40 MG tablet    Sig: Take 1 tablet (40 mg total) by mouth at bedtime.    Dispense:  90 tablet    Refill:  3    Going back to the 40 mg strength per pt preference    Order Specific Question:   Supervising Provider    Answer:   Olin Hauser [[id number]]   Follow up plan: Return in about 6 months (around 04/12/2019) for hypertension, cholesterol with LABS.  Cassell Smiles, DNP, AGPCNP-BC Adult Gerontology Primary Care Nurse Practitioner [facility] 10/10/2018, 10:40 AM

## 2018-10-10 NOTE — Patient Instructions (Addendum)
Ricky Lee,   Thank you for coming in to clinic today.  1. Continue current medications without changes.  Refills are sent.  Please schedule a follow-up appointment with Cassell Smiles, AGNP. Return in about 6 months (around 04/12/2019) for hypertension, cholesterol with LABS.  If you have any other questions or concerns, please feel free to call the clinic or send a message through [place]. You may also schedule an earlier appointment if necessary.  You will receive a survey after today's visit either digitally by e-mail or paper by [employer]. Your experiences and feedback matter to [place].  Please respond so we know how we are doing as we provide care for you.   Cassell Smiles, DNP, AGNP-BC Adult Gerontology Nurse Practitioner [place]

## 2018-10-13 ENCOUNTER — Encounter: Payer: Self-pay | Admitting: Nurse Practitioner

## 2018-10-14 ENCOUNTER — Ambulatory Visit: Payer: Medicare HMO | Admitting: Nurse Practitioner

## 2019-11-17 ENCOUNTER — Other Ambulatory Visit
Admission: RE | Admit: 2019-11-17 | Discharge: 2019-11-17 | Disposition: A | Discharge status: Home / Self Care | Payer: Medicare HMO | Source: Ambulatory Visit | Attending: Internal Medicine | Admitting: Internal Medicine

## 2019-11-17 ENCOUNTER — Other Ambulatory Visit: Payer: Self-pay

## 2019-11-17 DIAGNOSIS — Z01812 Encounter for preprocedural laboratory examination: Secondary | ICD-10-CM | POA: Insufficient documentation

## 2019-11-17 DIAGNOSIS — Z20822 Contact with and (suspected) exposure to covid-19: Secondary | ICD-10-CM | POA: Diagnosis not present

## 2019-11-17 LAB — SARS CORONAVIRUS 2 (TAT 6-24 HRS): SARS Coronavirus 2: NEGATIVE

## 2019-11-18 ENCOUNTER — Other Ambulatory Visit: Payer: Medicare HMO

## 2019-11-19 ENCOUNTER — Encounter: Payer: Self-pay | Admitting: Internal Medicine

## 2019-11-19 ENCOUNTER — Encounter
Admission: RE | Disposition: A | Discharge status: Home / Self Care | Payer: Self-pay | Source: Home / Self Care | Attending: Internal Medicine

## 2019-11-19 ENCOUNTER — Ambulatory Visit
Admission: RE | Admit: 2019-11-19 | Discharge: 2019-11-19 | Disposition: A | Discharge status: Home / Self Care | Payer: Medicare HMO | Attending: Internal Medicine | Admitting: Internal Medicine

## 2019-11-19 ENCOUNTER — Other Ambulatory Visit: Payer: Self-pay

## 2019-11-19 DIAGNOSIS — I25119 Atherosclerotic heart disease of native coronary artery with unspecified angina pectoris: Secondary | ICD-10-CM | POA: Diagnosis not present

## 2019-11-19 DIAGNOSIS — E785 Hyperlipidemia, unspecified: Secondary | ICD-10-CM | POA: Diagnosis not present

## 2019-11-19 DIAGNOSIS — I509 Heart failure, unspecified: Secondary | ICD-10-CM

## 2019-11-19 DIAGNOSIS — I252 Old myocardial infarction: Secondary | ICD-10-CM | POA: Insufficient documentation

## 2019-11-19 DIAGNOSIS — I2582 Chronic total occlusion of coronary artery: Secondary | ICD-10-CM | POA: Insufficient documentation

## 2019-11-19 DIAGNOSIS — I110 Hypertensive heart disease with heart failure: Secondary | ICD-10-CM | POA: Insufficient documentation

## 2019-11-19 DIAGNOSIS — Z87891 Personal history of nicotine dependence: Secondary | ICD-10-CM | POA: Diagnosis not present

## 2019-11-19 DIAGNOSIS — Z79899 Other long term (current) drug therapy: Secondary | ICD-10-CM | POA: Diagnosis not present

## 2019-11-19 DIAGNOSIS — Z888 Allergy status to other drugs, medicaments and biological substances status: Secondary | ICD-10-CM | POA: Diagnosis not present

## 2019-11-19 DIAGNOSIS — Z7982 Long term (current) use of aspirin: Secondary | ICD-10-CM | POA: Diagnosis not present

## 2019-11-19 DIAGNOSIS — I2584 Coronary atherosclerosis due to calcified coronary lesion: Secondary | ICD-10-CM | POA: Insufficient documentation

## 2019-11-19 DIAGNOSIS — Z885 Allergy status to narcotic agent status: Secondary | ICD-10-CM | POA: Diagnosis not present

## 2019-11-19 HISTORY — PX: LEFT HEART CATH AND CORONARY ANGIOGRAPHY: CATH118249

## 2019-11-19 SURGERY — LEFT HEART CATH AND CORONARY ANGIOGRAPHY
Anesthesia: Moderate Sedation | Laterality: Left

## 2019-11-19 MED ORDER — ACETAMINOPHEN 325 MG PO TABS
ORAL_TABLET | ORAL | Status: AC
  Filled 2019-11-19: qty 2

## 2019-11-19 MED ORDER — SODIUM CHLORIDE 0.9 % WEIGHT BASED INFUSION: 1.0000 mL/kg/h | INTRAVENOUS | Status: DC

## 2019-11-19 MED ORDER — FENTANYL CITRATE (PF) 100 MCG/2ML IJ SOLN
INTRAMUSCULAR | Status: AC
  Filled 2019-11-19: qty 2

## 2019-11-19 MED ORDER — ASPIRIN 81 MG PO CHEW: 81.0000 mg | CHEWABLE_TABLET | Freq: Every day | ORAL | Status: DC

## 2019-11-19 MED ORDER — ONDANSETRON HCL 4 MG/2ML IJ SOLN: 4.0000 mg | Freq: Four times a day (QID) | INTRAMUSCULAR | Status: DC | PRN

## 2019-11-19 MED ORDER — FENTANYL CITRATE (PF) 100 MCG/2ML IJ SOLN
INTRAMUSCULAR | Status: DC | PRN
  Administered 2019-11-19 (×2): 25 ug via INTRAVENOUS

## 2019-11-19 MED ORDER — SODIUM CHLORIDE 0.9% FLUSH: 3.0000 mL | Freq: Two times a day (BID) | INTRAVENOUS | Status: DC

## 2019-11-19 MED ORDER — HYDRALAZINE HCL 20 MG/ML IJ SOLN: 10.0000 mg | INTRAMUSCULAR | Status: DC | PRN

## 2019-11-19 MED ORDER — VERAPAMIL HCL 2.5 MG/ML IV SOLN
INTRAVENOUS | Status: AC
  Filled 2019-11-19: qty 2

## 2019-11-19 MED ORDER — ACETAMINOPHEN 325 MG PO TABS
650.0000 mg | ORAL_TABLET | ORAL | Status: DC | PRN
  Administered 2019-11-19: 650 mg via ORAL

## 2019-11-19 MED ORDER — LABETALOL HCL 5 MG/ML IV SOLN: 10.0000 mg | INTRAVENOUS | Status: DC | PRN

## 2019-11-19 MED ORDER — MIDAZOLAM HCL 2 MG/2ML IJ SOLN
INTRAMUSCULAR | Status: DC | PRN
  Administered 2019-11-19 (×2): 1 mg via INTRAVENOUS

## 2019-11-19 MED ORDER — HEPARIN SODIUM (PORCINE) 1000 UNIT/ML IJ SOLN
INTRAMUSCULAR | Status: AC
  Filled 2019-11-19: qty 1

## 2019-11-19 MED ORDER — HEPARIN (PORCINE) IN NACL 1000-0.9 UT/500ML-% IV SOLN
INTRAVENOUS | Status: AC
  Filled 2019-11-19: qty 1000

## 2019-11-19 MED ORDER — MIDAZOLAM HCL 2 MG/2ML IJ SOLN
INTRAMUSCULAR | Status: AC
  Filled 2019-11-19: qty 2

## 2019-11-19 MED ORDER — HEPARIN SODIUM (PORCINE) 1000 UNIT/ML IJ SOLN
INTRAMUSCULAR | Status: DC | PRN
  Administered 2019-11-19: 5500 [IU] via INTRAVENOUS

## 2019-11-19 MED ORDER — SODIUM CHLORIDE 0.9% FLUSH: 3.0000 mL | INTRAVENOUS | Status: DC | PRN

## 2019-11-19 MED ORDER — SODIUM CHLORIDE 0.9 % IV SOLN: 250.0000 mL | INTRAVENOUS | Status: DC | PRN

## 2019-11-19 MED ORDER — IOHEXOL 300 MG/ML  SOLN
INTRAMUSCULAR | Status: DC | PRN
  Administered 2019-11-19: 125 mL

## 2019-11-19 MED ORDER — VERAPAMIL HCL 2.5 MG/ML IV SOLN
INTRAVENOUS | Status: DC | PRN
  Administered 2019-11-19 (×2): 2.5 mg via INTRA_ARTERIAL

## 2019-11-19 MED ORDER — ISOSORBIDE MONONITRATE ER 30 MG PO TB24: 30.0000 mg | ORAL_TABLET | Freq: Every day | ORAL | 11 refills | Status: AC

## 2019-11-19 MED ORDER — HEPARIN (PORCINE) IN NACL 1000-0.9 UT/500ML-% IV SOLN
INTRAVENOUS | Status: DC | PRN
  Administered 2019-11-19: 500 mL

## 2019-11-19 SURGICAL SUPPLY — 19 items
CATH 5F 110X4 TIG (CATHETERS) ×2 IMPLANT
CATH AMP RT 5F (CATHETERS) ×2 IMPLANT
CATH INFINITI 5 FR 3DRC (CATHETERS) ×2 IMPLANT
CATH INFINITI 5 FR JL3.5 (CATHETERS) IMPLANT
CATH INFINITI 5 FR MPA2 (CATHETERS) ×2 IMPLANT
CATH INFINITI JR4 5F (CATHETERS) ×2 IMPLANT
DEVICE CLOSURE MYNXGRIP 5F (Vascular Products) ×2 IMPLANT
DEVICE RAD TR BAND REGULAR (VASCULAR PRODUCTS) ×2 IMPLANT
DEVICE SAFEGUARD 24CM (GAUZE/BANDAGES/DRESSINGS) ×2 IMPLANT
GLIDESHEATH SLEND SS 6F .021 (SHEATH) ×2 IMPLANT
GUIDEWIRE INQWIRE 1.5J.035X260 (WIRE) ×1 IMPLANT
INQWIRE 1.5J .035X260CM (WIRE) ×2
KIT MANI 3VAL PERCEP (MISCELLANEOUS) ×2 IMPLANT
NEEDLE PERC 18GX7CM (NEEDLE) ×2 IMPLANT
PACK CARDIAC CATH (CUSTOM PROCEDURE TRAY) ×2 IMPLANT
SHEATH AVANTI 5FR X 11CM (SHEATH) ×2 IMPLANT
SHEATH AVANTI 6FR X 11CM (SHEATH) IMPLANT
TUBING CIL FLEX 10 FLL-RA (TUBING) ×2 IMPLANT
WIRE GUIDERIGHT .035X150 (WIRE) ×2 IMPLANT

## 2019-11-19 NOTE — Discharge Instructions (Signed)
Radial Site Care Refer to this sheet in the next few weeks. These instructions provide you with information about caring for yourself after your procedure. Your health care provider may also give you more specific instructions. Your treatment has been planned according to current medical practices, but problems sometimes occur. Call your health care provider if you have any problems or questions after your procedure. What can I expect after the procedure? After your procedure, it is typical to have the following:  Bruising at the radial site that usually fades within 1-2 weeks.  Blood collecting in the tissue (hematoma) that may be painful to the touch. It should usually decrease in size and tenderness within 1-2 weeks.  Follow these instructions at home:  Take medicines only as directed by your health care provider. If you are on a medication called Metformin please do not take for 48 hours after your procedure.  Over the next 48hrs please increase your fluid intake of water and non caffeine beverages to flush the contrast dye out of your system.   You may shower 24 hours after the procedure  Leave your bandage on and gently wash the site with plain soap and water. Pat the area dry with a clean towel. Do not rub the site, because this may cause bleeding.  Remove your dressing 48hrs after your procedure and leave open to air.   Do not submerge your site in water for 7 days. This includes swimming and washing dishes.   Check your insertion site every day for redness, swelling, or drainage.  Do not apply powder or lotion to the site.  Do not flex or bend the affected arm for 24 hours or as directed by your health care provider.  Do not push or pull heavy objects with the affected arm for 24 hours or as directed by your health care provider.  Do not lift over 10 lb (4.5 kg) for 5 days after your procedure or as directed by your health care provider.  Ask your health care provider when it is  okay to: ? Return to work or school. ? Resume usual physical activities or sports. ? Resume sexual activity.  Do not drive home if you are discharged the same day as the procedure. Have someone else drive you.  You may drive 48 hours after the procedure Do not operate machinery or power tools for 24 hours after the procedure.  If your procedure was done as an outpatient procedure, which means that you went home the same day as your procedure, a responsible adult should be with you for the first 24 hours after you arrive home.  Keep all follow-up visits as directed by your health care provider. This is important. Contact a health care provider if:  You have a fever.  You have chills.  You have increased bleeding from the radial site. Hold pressure on the site. Get help right away if:  You have unusual pain at the radial site.  You have redness, warmth, or swelling at the radial site.  You have drainage (other than a small amount of blood on the dressing) from the radial site.  The radial site is bleeding, and the bleeding does not stop after 15 minutes of holding steady pressure on the site.  Your arm or hand becomes pale, cool, tingly, or numb. This information is not intended to replace advice given to you by your health care provider. Make sure you discuss any questions you have with your health care provider.  Document Released: 03/18/2010 Document Revised: 07/22/2015 Document Reviewed: 09/01/2013 Elsevier Interactive Patient Education  2018 [employer].  Femoral Site Care Refer to this sheet in the next few weeks. These instructions provide you with information about caring for yourself after your procedure. Your health care provider may also give you more specific instructions. Your treatment has been planned according to current medical practices, but problems sometimes occur. Call your health care provider if you have any problems or questions after your procedure. What  can I expect after the procedure? After your procedure, it is typical to have the following:  Bruising at the site that usually fades within 1-2 weeks.  Blood collecting in the tissue (hematoma) that may be painful to the touch. It should usually decrease in size and tenderness within 1-2 weeks.  Follow these instructions at home:   Take medicines only as directed by your health care provider.  If you take Metformin, hold for 48hours after your procedure.  The x-ray dye causes you to pass a considerate amount of urine.  For this reason, you will be asked to drink plenty of liquids after the procedure to prevent dehydration.  You may resume you regular diet.  Avoid caffeine products.    You may shower 24 hours after procedure. Leave the bandage on your access site for.  You may wash around your dressing but do not rub the site, this may cause bleeding.  Pat the area dry with a clean towel. After 48hours remove bandage and leave open to air.  Do not take baths, swim, or use a hot tub for 7 days.  Check your insertion site every day for redness, swelling, or drainage.  If you lose feeling or develop tingling or pain in your leg or foot after the procedure, please walk around first.  If the discomfort does not improve , contact your physician and proceed to the nearest emergency room.  Loss of feeling in your leg might mean that a blockage has formed in the artery and this can be appropriately treated.  Limit your activity for the next two days after your procedure.  Avoid stooping, bending, heavy lifting or exertion as this may put pressure on the insertion site.  Resume normal activities in 48 hours.   check the insertion site occasionally.  If any oozing occurs or there is apparent swelling, firm pressure over the site will prevent a bruise from forming.  You can not hurt anything by pressing directly on the site.  The pressure stops the bleeding by allowing a small clot to form.  If the bleeding  continues after the pressure has been applied for more than 15 minutes, call 911 or go to the nearest emergency room.     Apply pressure to access site if you have to laugh, cough, or sneeze.  Do not apply powder or lotion to the site.  Limit use of stairs to twice a day for the first 2-3 days or as directed by your health care provider.  Do not squat for the first 2-3 days or as directed by your health care provider.  Do not lift over 10 lb (4.5 kg) for 5 days after your procedure or as directed by your health care provider.  Ask your health care provider when it is okay to: ? Return to work or school. ? Resume usual physical activities or sports. ? Resume sexual activity.  Do not drive home if you are discharged the same day as the procedure. Have someone  else drive you.  You may drive 48 hours after the procedure unless otherwise instructed by your health care provider.  Do not operate machinery or power tools for 24 hours after the procedure or as directed by your health care provider.  If your procedure was done as an outpatient procedure, which means that you went home the same day as your procedure, a responsible adult should be with you for the first 24 hours after you arrive home.  Keep all follow-up visits as directed by your health care provider. This is important. Contact a health care provider if:  You have a fever.  You have chills.  You have increased bleeding from the site. Hold pressure on the site. Get help right away if:  You have unusual pain at the site.  You have redness, warmth, or swelling at the site.  You have drainage (other than a small amount of blood on the dressing) from the site.  The site is bleeding, and the bleeding does not stop after 30 minutes of holding steady pressure on the site.  Your leg or foot becomes pale, cool, tingly, or numb. This information is not intended to replace advice given to you by your health care provider. Make  sure you discuss any questions you have with your health care provider. Document Released: 10/17/2013 Document Revised: 07/22/2015 Document Reviewed: 09/02/2013 Elsevier Interactive Patient Education  2018 [employer].   Moderate Conscious Sedation, Adult, Care After These instructions provide you with information about caring for yourself after your procedure. Your health care provider may also give you more specific instructions. Your treatment has been planned according to current medical practices, but problems sometimes occur. Call your health care provider if you have any problems or questions after your procedure. What can I expect after the procedure? After your procedure, it is common:  To feel sleepy for several hours.  To feel clumsy and have poor balance for several hours.  To have poor judgment for several hours.  To vomit if you eat too soon. Follow these instructions at home: For at least 24 hours after the procedure:   Do not: ? Participate in activities where you could fall or become injured. ? Drive. ? Use heavy machinery. ? Drink alcohol. ? Take sleeping pills or medicines that cause drowsiness. ? Make important decisions or sign legal documents. ? Take care of children on your own.  Rest. Eating and drinking  Follow the diet recommended by your health care provider.  If you vomit: ? Drink water, juice, or soup when you can drink without vomiting. ? Make sure you have little or no nausea before eating solid foods. General instructions  Have a responsible adult stay with you until you are awake and alert.  Take over-the-counter and prescription medicines only as told by your health care provider.  If you smoke, do not smoke without supervision.  Keep all follow-up visits as told by your health care provider. This is important. Contact a health care provider if:  You keep feeling nauseous or you keep vomiting.  You feel light-headed.  You  develop a rash.  You have a fever. Get help right away if:  You have trouble breathing. This information is not intended to replace advice given to you by your health care provider. Make sure you discuss any questions you have with your health care provider. Document Revised: 01/26/2017 Document Reviewed: 06/05/2015 Elsevier Patient Education  2020 [employer].

## 2019-11-20 ENCOUNTER — Encounter: Payer: Self-pay | Admitting: Internal Medicine

## 2020-02-24 ENCOUNTER — Other Ambulatory Visit: Payer: Self-pay | Admitting: Nurse Practitioner

## 2020-02-24 DIAGNOSIS — M79661 Pain in right lower leg: Secondary | ICD-10-CM

## 2020-02-26 ENCOUNTER — Ambulatory Visit
Admission: RE | Admit: 2020-02-26 | Discharge: 2020-02-26 | Disposition: A | Discharge status: Home / Self Care | Payer: Medicare HMO | Source: Ambulatory Visit | Attending: Nurse Practitioner | Admitting: Nurse Practitioner

## 2020-02-26 ENCOUNTER — Other Ambulatory Visit: Payer: Self-pay

## 2020-02-26 DIAGNOSIS — M79661 Pain in right lower leg: Secondary | ICD-10-CM | POA: Diagnosis present

## 2020-03-22 DIAGNOSIS — I509 Heart failure, unspecified: Secondary | ICD-10-CM | POA: Diagnosis not present

## 2020-03-22 DIAGNOSIS — R600 Localized edema: Secondary | ICD-10-CM | POA: Diagnosis not present

## 2020-03-22 DIAGNOSIS — I252 Old myocardial infarction: Secondary | ICD-10-CM | POA: Diagnosis not present

## 2020-03-22 DIAGNOSIS — I25118 Atherosclerotic heart disease of native coronary artery with other forms of angina pectoris: Secondary | ICD-10-CM | POA: Diagnosis not present

## 2020-03-22 DIAGNOSIS — I10 Essential (primary) hypertension: Secondary | ICD-10-CM | POA: Diagnosis not present

## 2020-03-22 DIAGNOSIS — E782 Mixed hyperlipidemia: Secondary | ICD-10-CM | POA: Diagnosis not present

## 2020-03-22 DIAGNOSIS — Z6841 Body Mass Index (BMI) 40.0 and over, adult: Secondary | ICD-10-CM | POA: Diagnosis not present

## 2020-05-20 DIAGNOSIS — I25119 Atherosclerotic heart disease of native coronary artery with unspecified angina pectoris: Secondary | ICD-10-CM | POA: Diagnosis not present

## 2020-05-20 DIAGNOSIS — I739 Peripheral vascular disease, unspecified: Secondary | ICD-10-CM | POA: Diagnosis not present

## 2020-05-20 DIAGNOSIS — G8929 Other chronic pain: Secondary | ICD-10-CM | POA: Diagnosis not present

## 2020-05-20 DIAGNOSIS — I110 Hypertensive heart disease with heart failure: Secondary | ICD-10-CM | POA: Diagnosis not present

## 2020-05-20 DIAGNOSIS — J449 Chronic obstructive pulmonary disease, unspecified: Secondary | ICD-10-CM | POA: Diagnosis not present

## 2020-05-20 DIAGNOSIS — I509 Heart failure, unspecified: Secondary | ICD-10-CM | POA: Diagnosis not present

## 2020-05-20 DIAGNOSIS — E261 Secondary hyperaldosteronism: Secondary | ICD-10-CM | POA: Diagnosis not present

## 2020-05-20 DIAGNOSIS — I429 Cardiomyopathy, unspecified: Secondary | ICD-10-CM | POA: Diagnosis not present

## 2020-05-20 DIAGNOSIS — E785 Hyperlipidemia, unspecified: Secondary | ICD-10-CM | POA: Diagnosis not present

## 2020-05-20 DIAGNOSIS — Z008 Encounter for other general examination: Secondary | ICD-10-CM | POA: Diagnosis not present

## 2020-05-27 DIAGNOSIS — N1831 Chronic kidney disease, stage 3a: Secondary | ICD-10-CM | POA: Diagnosis not present

## 2020-05-27 DIAGNOSIS — E785 Hyperlipidemia, unspecified: Secondary | ICD-10-CM | POA: Diagnosis not present

## 2020-05-27 DIAGNOSIS — I2510 Atherosclerotic heart disease of native coronary artery without angina pectoris: Secondary | ICD-10-CM | POA: Diagnosis not present

## 2020-05-27 DIAGNOSIS — I872 Venous insufficiency (chronic) (peripheral): Secondary | ICD-10-CM | POA: Diagnosis not present

## 2020-05-27 DIAGNOSIS — R252 Cramp and spasm: Secondary | ICD-10-CM | POA: Diagnosis not present

## 2020-05-27 DIAGNOSIS — I10 Essential (primary) hypertension: Secondary | ICD-10-CM | POA: Diagnosis not present

## 2020-05-28 DIAGNOSIS — I10 Essential (primary) hypertension: Secondary | ICD-10-CM | POA: Diagnosis not present

## 2020-05-28 DIAGNOSIS — N1831 Chronic kidney disease, stage 3a: Secondary | ICD-10-CM | POA: Diagnosis not present

## 2020-05-28 DIAGNOSIS — R252 Cramp and spasm: Secondary | ICD-10-CM | POA: Diagnosis not present

## 2020-05-28 DIAGNOSIS — E785 Hyperlipidemia, unspecified: Secondary | ICD-10-CM | POA: Diagnosis not present

## 2020-08-09 DIAGNOSIS — N1831 Chronic kidney disease, stage 3a: Secondary | ICD-10-CM | POA: Diagnosis not present

## 2020-08-09 DIAGNOSIS — I10 Essential (primary) hypertension: Secondary | ICD-10-CM | POA: Diagnosis not present

## 2020-08-09 DIAGNOSIS — I129 Hypertensive chronic kidney disease with stage 1 through stage 4 chronic kidney disease, or unspecified chronic kidney disease: Secondary | ICD-10-CM | POA: Diagnosis not present

## 2020-08-09 DIAGNOSIS — R600 Localized edema: Secondary | ICD-10-CM | POA: Diagnosis not present

## 2020-08-17 DIAGNOSIS — R600 Localized edema: Secondary | ICD-10-CM | POA: Diagnosis not present

## 2020-08-17 DIAGNOSIS — N1832 Chronic kidney disease, stage 3b: Secondary | ICD-10-CM | POA: Diagnosis not present

## 2020-08-17 DIAGNOSIS — I10 Essential (primary) hypertension: Secondary | ICD-10-CM | POA: Diagnosis not present

## 2020-11-29 IMAGING — CR LEFT ELBOW - COMPLETE 3+ VIEW
1 series · 4 of 4 positions shown · non-contrast
Comparison: None.

CLINICAL DATA: Pain over the last month.

EXAM:
LEFT ELBOW - COMPLETE 3+ VIEW

[Series 1: dg elbow complete left (3+view) · 0.14mm/px · 4 of 4 slices shown]
[im 1/4]
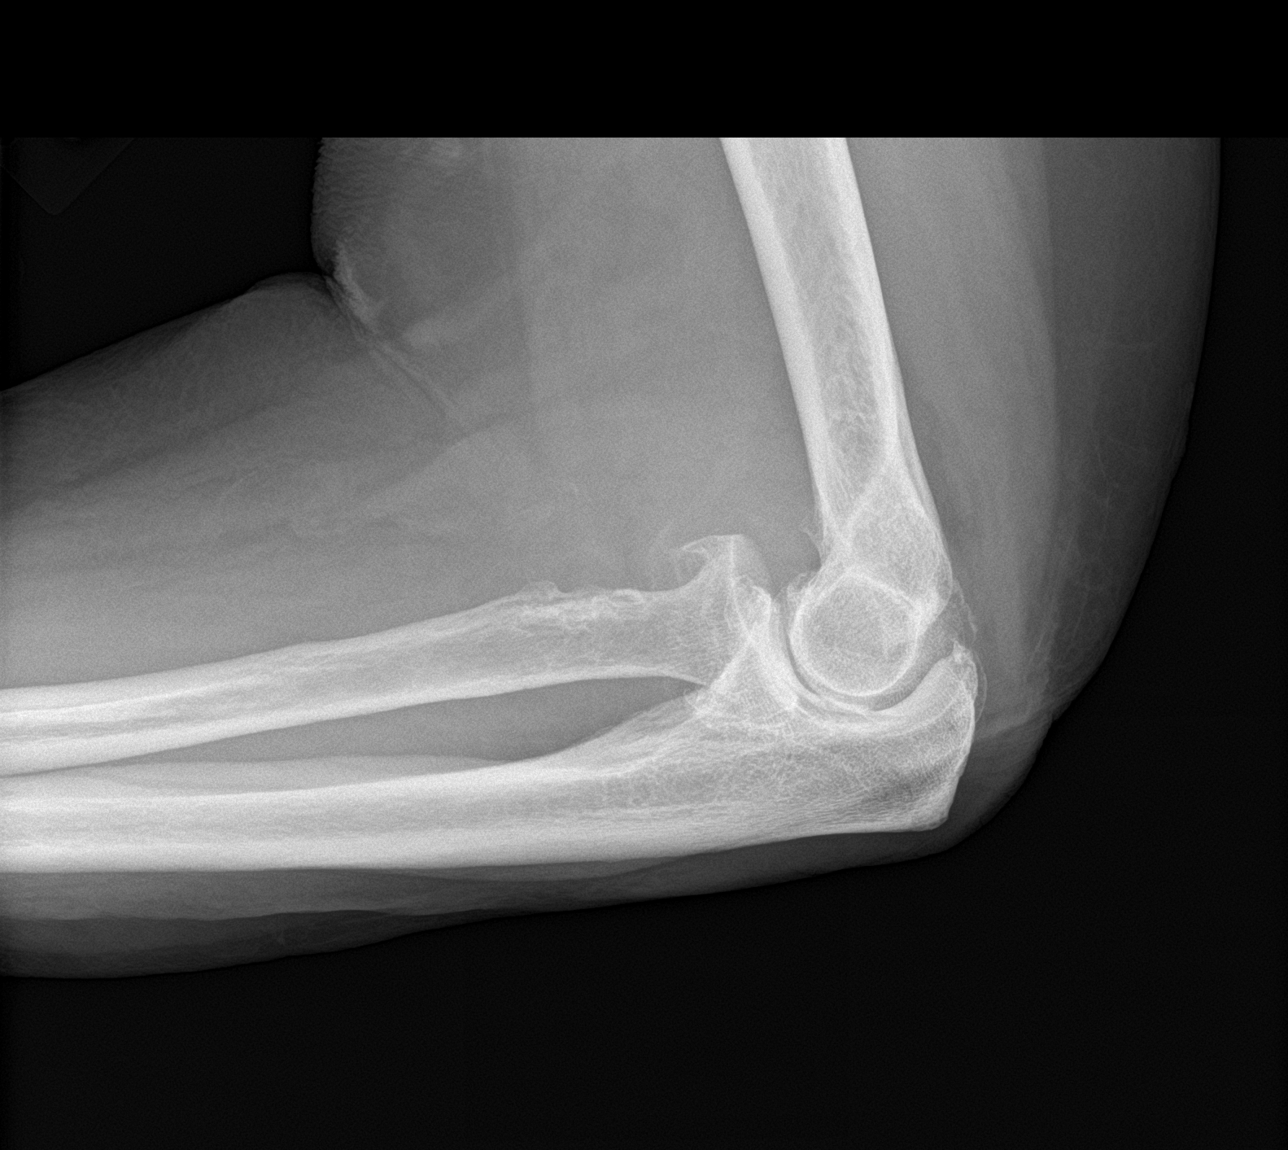
[im 2/4]
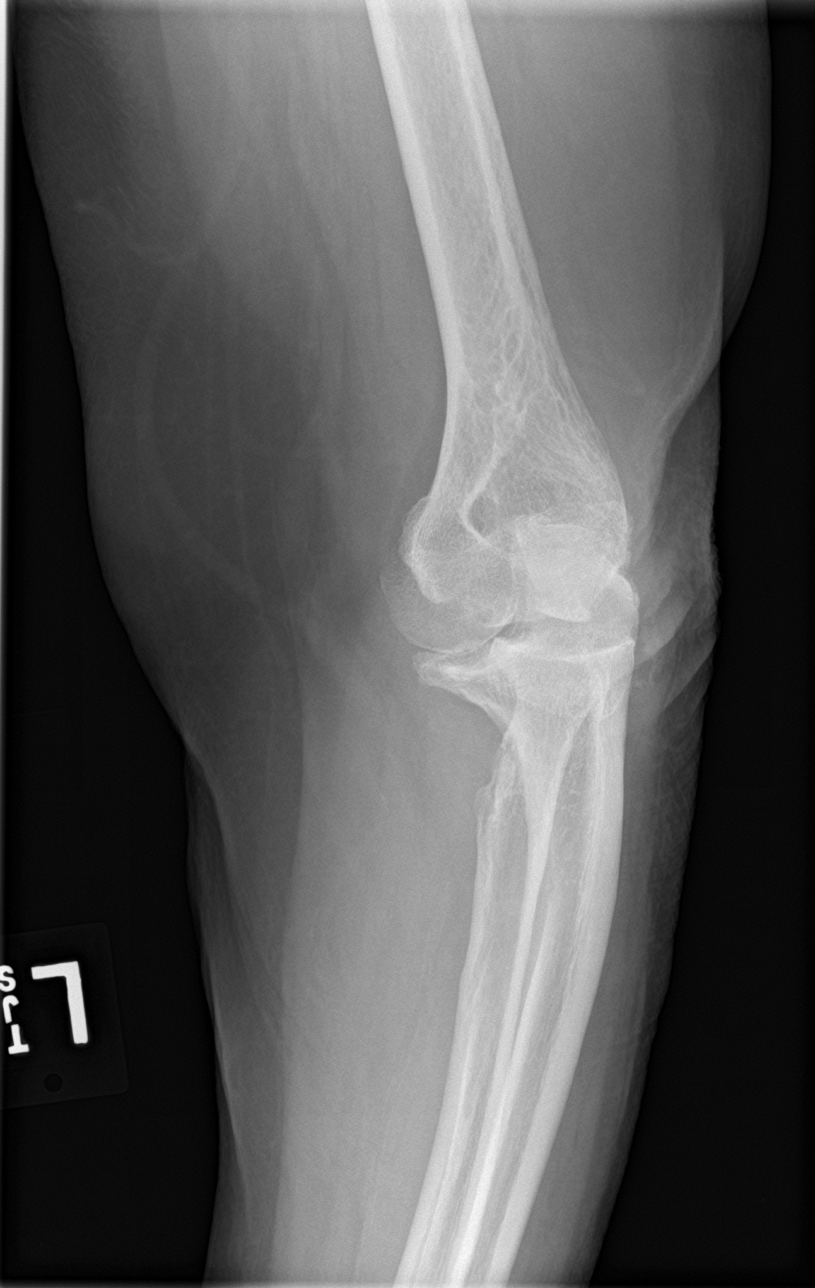
[im 3/4]
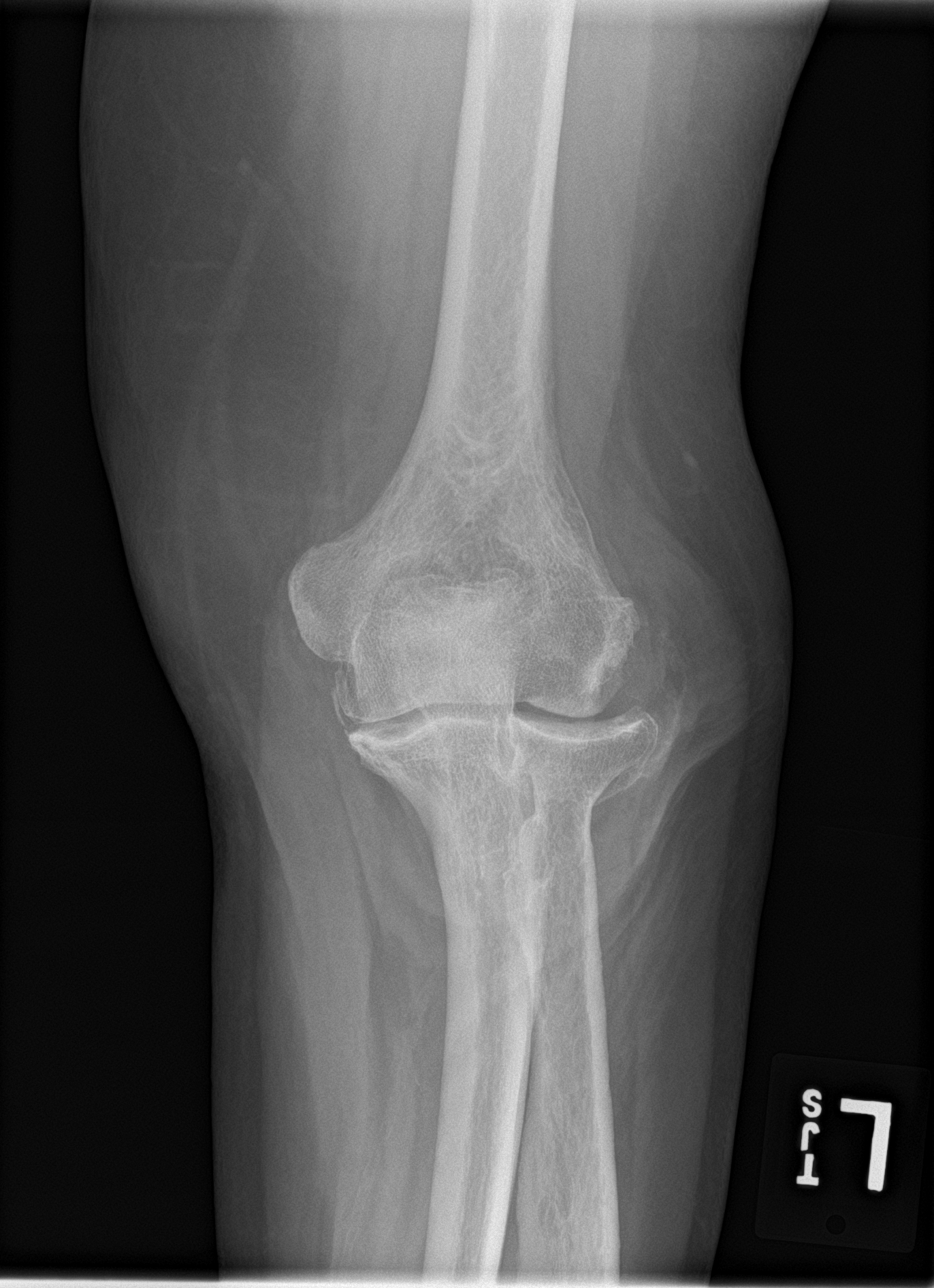
[im 4/4]
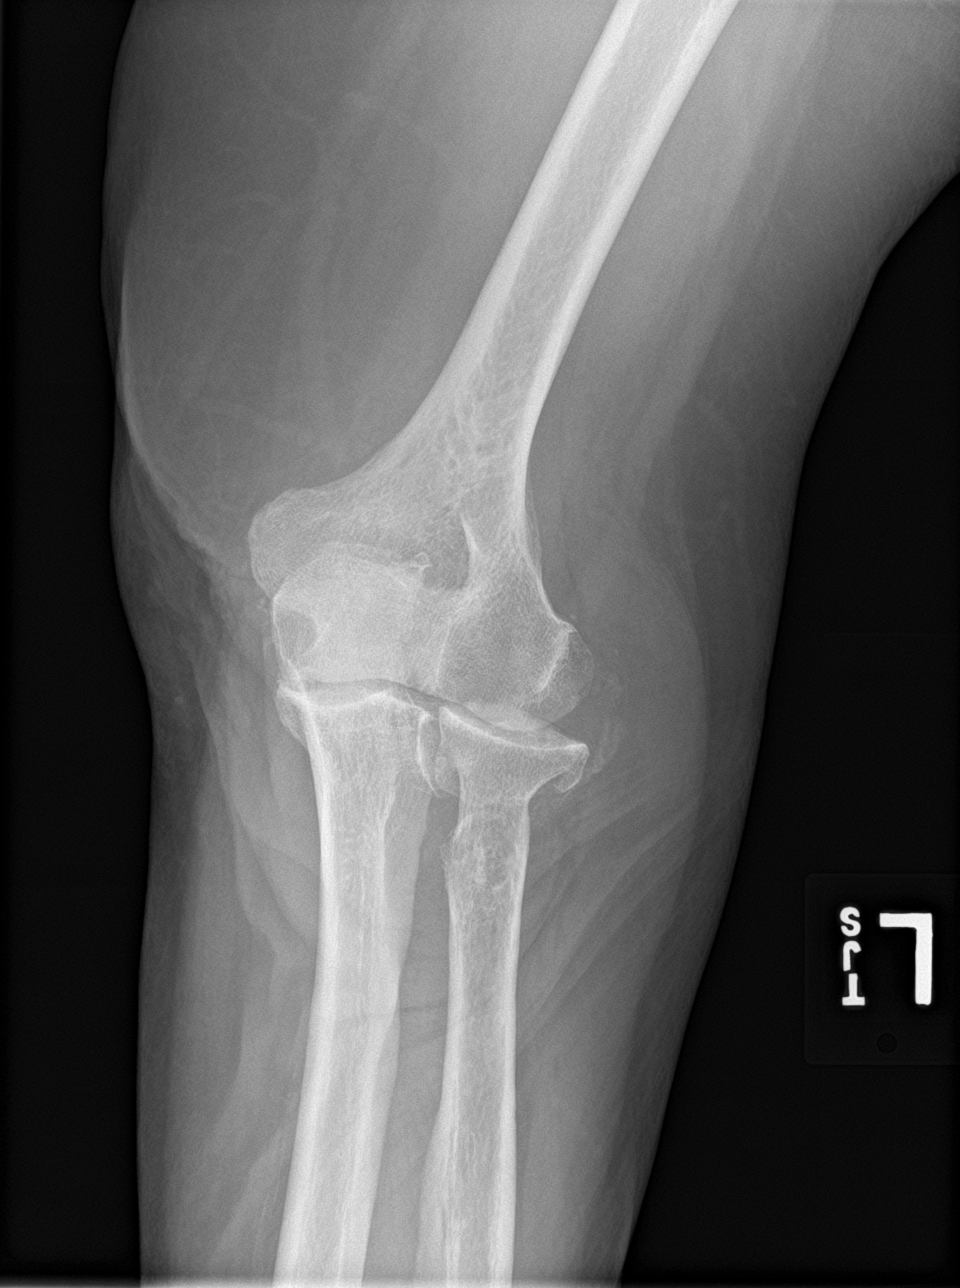

[4 of 4 positions shown; findings below may reference images not displayed]

FINDINGS: Joint effusion. Osteoarthritis of the elbow joint with chronic
deformity of the radial head and prominent osteophytes. No sign of
fracture. No visible loose body. No focal bone lesion.
IMPRESSION: Chronic osteoarthritis.  Joint effusion.

## 2022-06-30 ENCOUNTER — Other Ambulatory Visit: Payer: Self-pay | Admitting: Internal Medicine

## 2022-06-30 DIAGNOSIS — R42 Dizziness and giddiness: Secondary | ICD-10-CM

## 2022-06-30 DIAGNOSIS — R0609 Other forms of dyspnea: Secondary | ICD-10-CM

## 2022-06-30 DIAGNOSIS — I2510 Atherosclerotic heart disease of native coronary artery without angina pectoris: Secondary | ICD-10-CM

## 2022-06-30 DIAGNOSIS — I252 Old myocardial infarction: Secondary | ICD-10-CM

## 2022-07-25 ENCOUNTER — Telehealth (HOSPITAL_COMMUNITY): Payer: Self-pay | Admitting: *Deleted

## 2022-07-25 NOTE — Telephone Encounter (Signed)
Attempted to call patient regarding upcoming cardiac CT appointment. °Left message on voicemail with name and callback number ° °Kaylah Chiasson RN Navigator Cardiac Imaging °[facility] °[phone] Office °[phone] Cell ° °

## 2022-07-26 ENCOUNTER — Ambulatory Visit
Admission: RE | Admit: 2022-07-26 | Discharge: 2022-07-26 | Disposition: A | Discharge status: Home / Self Care | Payer: Medicare HMO | Source: Ambulatory Visit | Attending: Internal Medicine | Admitting: Internal Medicine

## 2022-07-26 DIAGNOSIS — I252 Old myocardial infarction: Secondary | ICD-10-CM

## 2022-07-26 DIAGNOSIS — R42 Dizziness and giddiness: Secondary | ICD-10-CM

## 2022-07-26 DIAGNOSIS — I2510 Atherosclerotic heart disease of native coronary artery without angina pectoris: Secondary | ICD-10-CM

## 2022-07-26 DIAGNOSIS — R0609 Other forms of dyspnea: Secondary | ICD-10-CM

## 2022-07-26 MED ORDER — IOHEXOL 350 MG/ML SOLN: 100.0000 mL | Freq: Once | INTRAVENOUS | Status: DC | PRN

## 2022-07-26 NOTE — Progress Notes (Signed)
Pt instructed that we have to reschedule his exam give n his HRi s 104. Pt also informed that MD Agbor is aware and nurse navigators would call him with further instructions.

## 2022-08-07 ENCOUNTER — Inpatient Hospital Stay: Admission: RE | Admit: 2022-08-07 | Payer: Medicare HMO | Source: Ambulatory Visit

## 2022-08-11 ENCOUNTER — Encounter (HOSPITAL_COMMUNITY): Payer: Self-pay

## 2022-08-11 ENCOUNTER — Telehealth (HOSPITAL_COMMUNITY): Payer: Self-pay | Admitting: Emergency Medicine

## 2022-08-11 DIAGNOSIS — R079 Chest pain, unspecified: Secondary | ICD-10-CM

## 2022-08-11 MED ORDER — IVABRADINE HCL 5 MG PO TABS
15.0000 mg | ORAL_TABLET | Freq: Once | ORAL | 0 refills | Status: AC
Start: 2022-08-11 — End: 2022-08-11

## 2022-08-11 NOTE — Telephone Encounter (Signed)
Reaching out to patient to offer assistance regarding upcoming cardiac imaging study; pt verbalizes understanding of appt date/time, parking situation and where to check in, pre-test NPO status and medications ordered, and verified current allergies; name and call back number provided for further questions should they arise Jadie Allington RN Navigator Cardiac Imaging [facility] [phone] office [phone] cell 

## 2022-08-14 ENCOUNTER — Other Ambulatory Visit: Payer: Self-pay | Admitting: Internal Medicine

## 2022-08-14 ENCOUNTER — Ambulatory Visit
Admission: RE | Admit: 2022-08-14 | Discharge: 2022-08-14 | Disposition: A | Discharge status: Home / Self Care | Payer: Medicare HMO | Source: Ambulatory Visit | Attending: Internal Medicine | Admitting: Internal Medicine

## 2022-08-14 ENCOUNTER — Ambulatory Visit: Payer: Medicare HMO

## 2022-08-14 ENCOUNTER — Ambulatory Visit
Admission: RE | Admit: 2022-08-14 | Discharge: 2022-08-14 | Disposition: A | Discharge status: Home / Self Care | Payer: Self-pay | Source: Ambulatory Visit | Attending: Internal Medicine | Admitting: Internal Medicine

## 2022-08-14 DIAGNOSIS — R42 Dizziness and giddiness: Secondary | ICD-10-CM | POA: Diagnosis not present

## 2022-08-14 DIAGNOSIS — I2510 Atherosclerotic heart disease of native coronary artery without angina pectoris: Secondary | ICD-10-CM

## 2022-08-14 DIAGNOSIS — R0609 Other forms of dyspnea: Secondary | ICD-10-CM | POA: Diagnosis present

## 2022-08-14 DIAGNOSIS — I252 Old myocardial infarction: Secondary | ICD-10-CM

## 2022-08-14 MED ORDER — IOHEXOL 350 MG/ML SOLN
100.0000 mL | Freq: Once | INTRAVENOUS | Status: AC | PRN
  Administered 2022-08-14: 100 mL via INTRAVENOUS

## 2022-08-14 MED ORDER — NITROGLYCERIN 0.4 MG SL SUBL
0.8000 mg | SUBLINGUAL_TABLET | Freq: Once | SUBLINGUAL | Status: AC
  Administered 2022-08-14: 0.8 mg via SUBLINGUAL
  Filled 2022-08-14: qty 25

## 2022-08-14 MED ORDER — METOPROLOL TARTRATE 5 MG/5ML IV SOLN
5.0000 mg | Freq: Once | INTRAVENOUS | Status: AC
  Administered 2022-08-14: 5 mg via INTRAVENOUS
  Filled 2022-08-14: qty 5

## 2022-08-14 MED ORDER — METOPROLOL TARTRATE 5 MG/5ML IV SOLN
INTRAVENOUS | Status: AC
  Filled 2022-08-14: qty 10

## 2022-08-14 NOTE — Progress Notes (Signed)
Patient tolerated procedure well. Ambulate w/o difficulty. Denies any lightheadedness or being dizzy. Pt denies any pain at this time. Sitting in chair, pt is encouraged to drink additional water throughout the day and reason explained to patient. Patient verbalized understanding and all questions answered. ABC intact. No further needs at this time. Discharge from procedure area w/o issues.  

## 2022-08-15 IMAGING — US US EXTREM LOW VENOUS*R*
1 series · 14 of 24 positions shown · non-contrast
Comparison: None.

CLINICAL DATA: Right lower extremity pain for several months.

EXAM:
Right LOWER EXTREMITY VENOUS DOPPLER ULTRASOUND
TECHNIQUE: Gray-scale sonography with compression, as well as color and duplex
ultrasound, were performed to evaluate the deep venous system(s)
from the level of the common femoral vein through the popliteal and
proximal calf veins.

[Series 1: us extrem low venous*right* · 0.08mm/px · 14 of 34 slices shown]
[im 1/34]
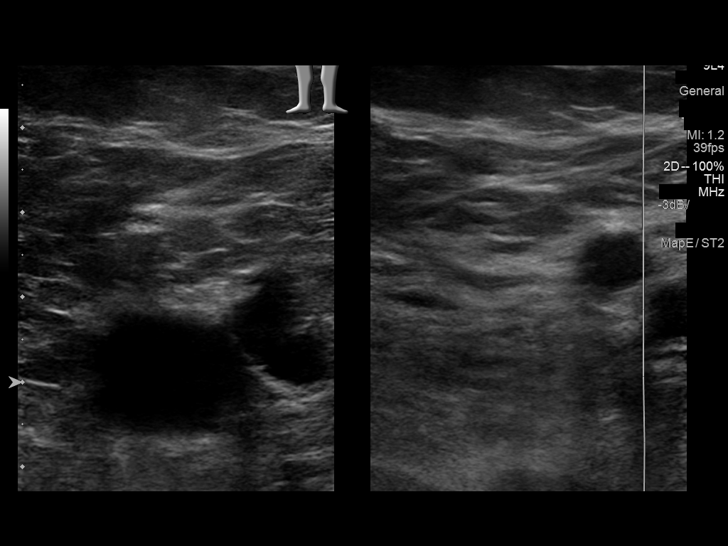
[im 3/34]
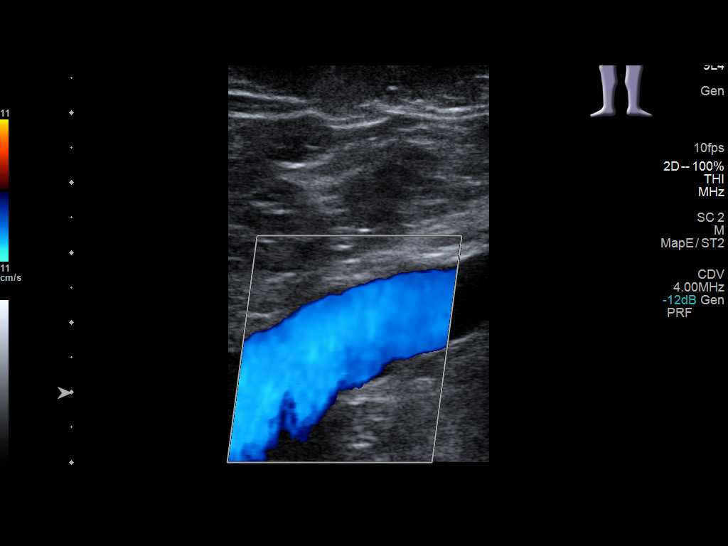
[im 6/34]
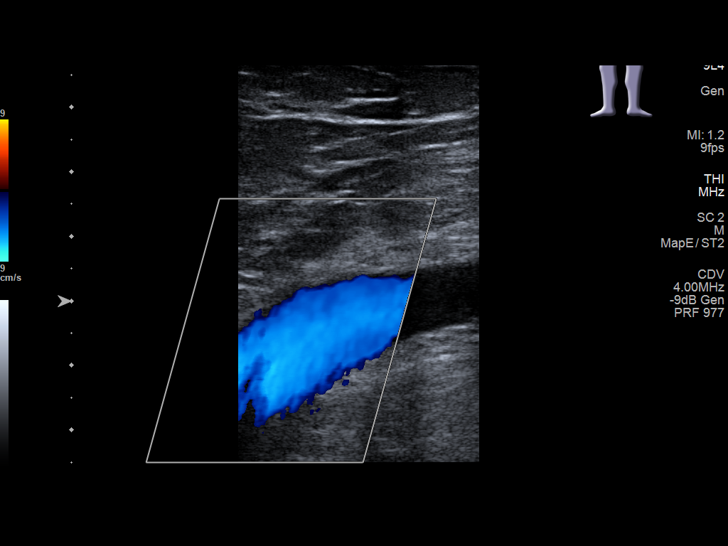
[im 9/34]
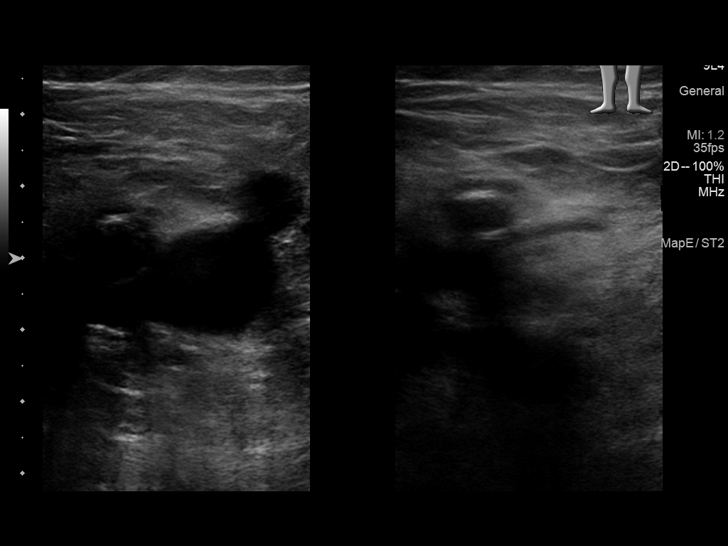
[im 11/34]
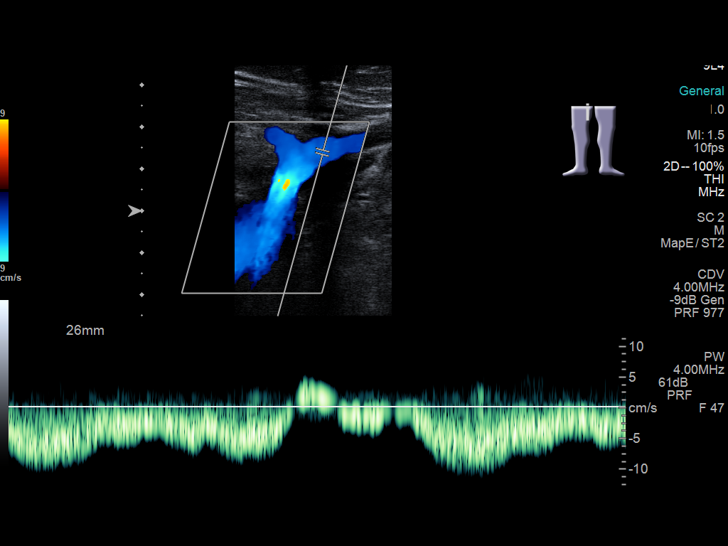
[im 13/34]
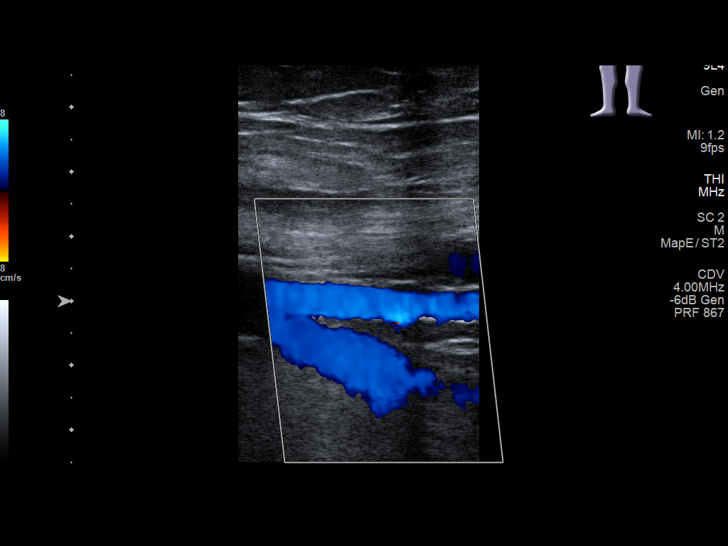
[im 16/34]
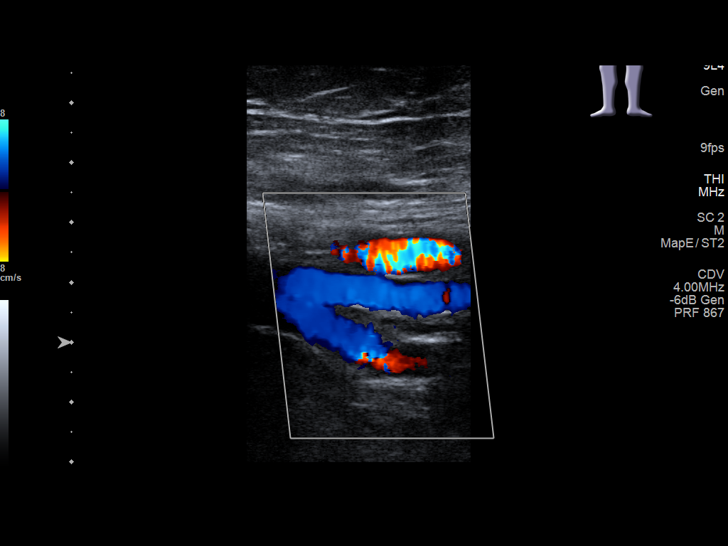
[im 18/34]
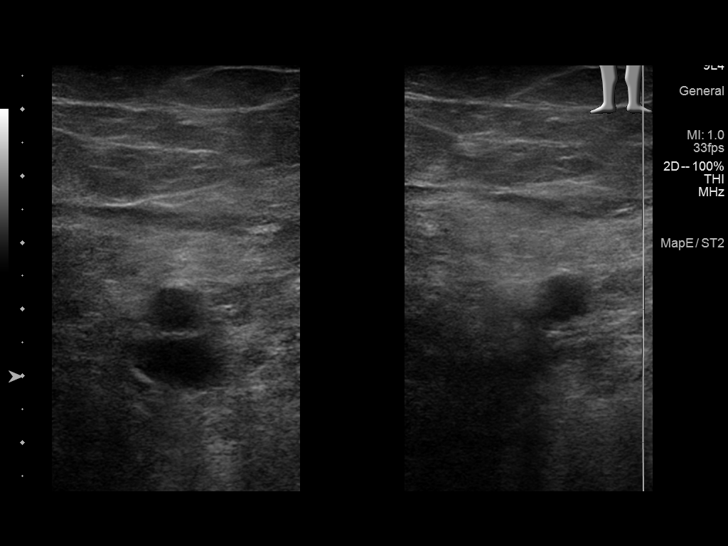
[im 21/34]
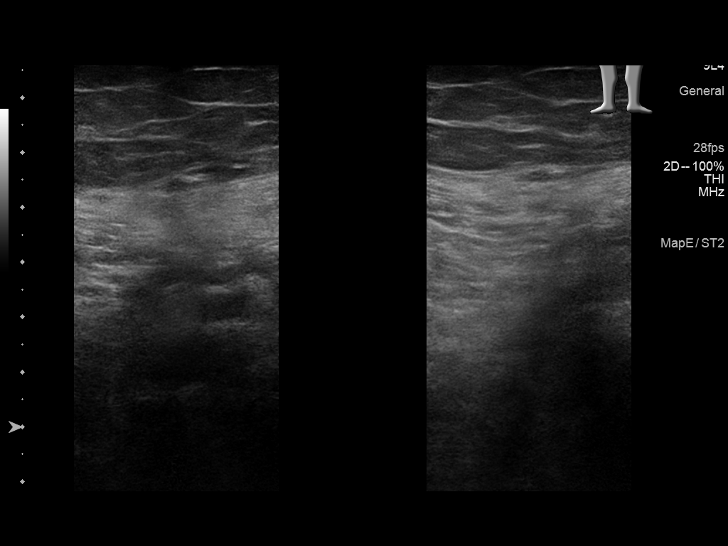
[im 23/34]
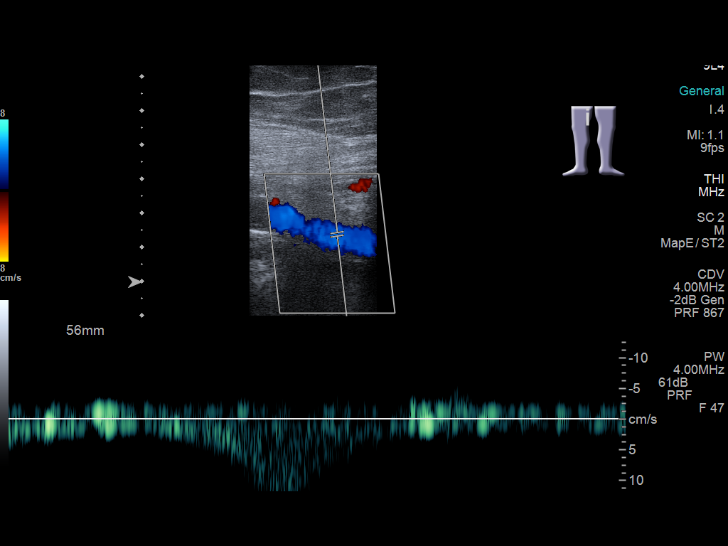
[im 26/34]
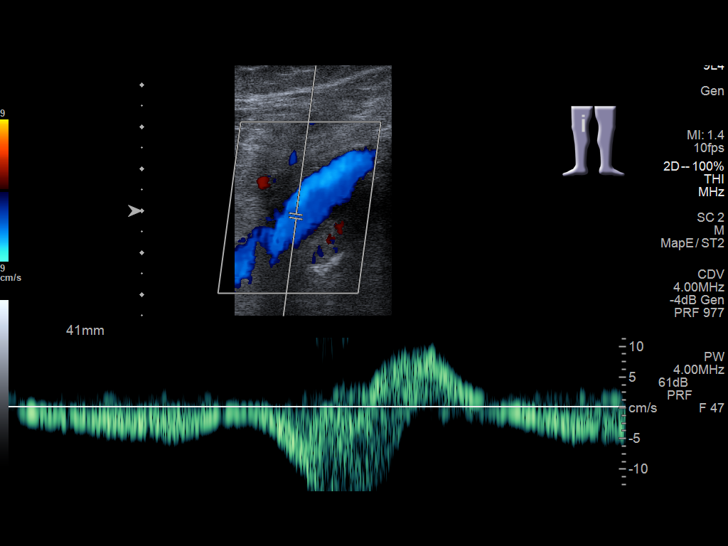
[im 28/34]
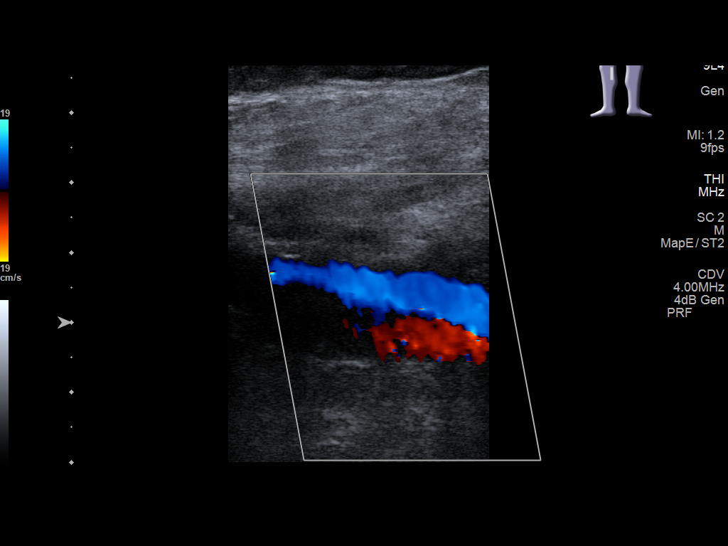
[im 31/34]
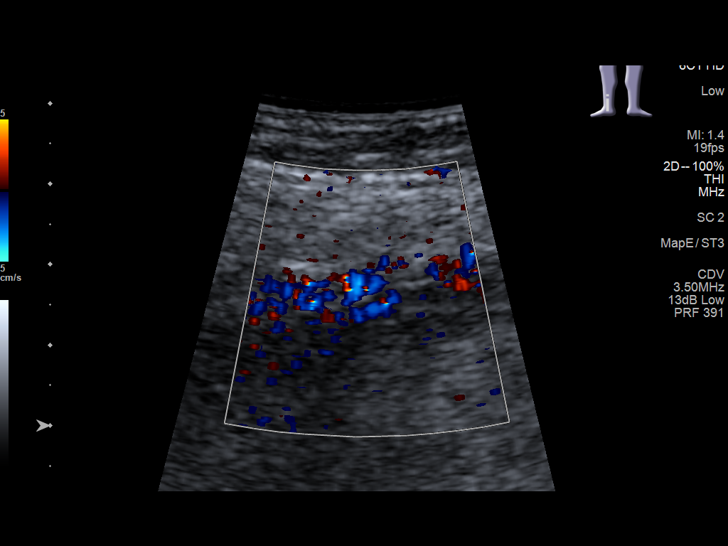
[im 34/34]
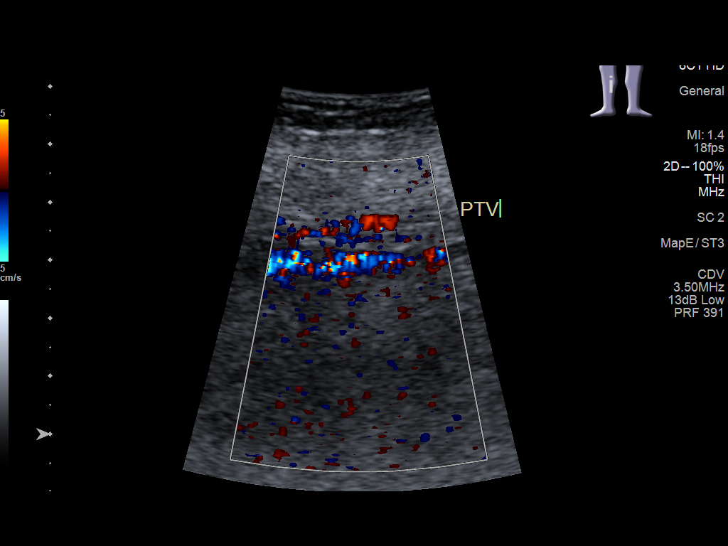

[14 of 24 positions shown; findings below may reference images not displayed]

FINDINGS: VENOUS

Normal compressibility of the common femoral, superficial femoral,
and popliteal veins, as well as the visualized calf veins.
Visualized portions of profunda femoral vein and great saphenous
vein unremarkable. No filling defects to suggest DVT on grayscale or
color Doppler imaging. Doppler waveforms show normal direction of
venous flow, normal respiratory plasticity and response to
augmentation.

Limited views of the contralateral common femoral vein are
unremarkable.

OTHER

None.

Limitations: none
IMPRESSION: Negative.

## 2022-09-06 ENCOUNTER — Inpatient Hospital Stay: Admission: RE | Admit: 2022-09-06 | Payer: Medicare HMO | Source: Ambulatory Visit

## 2023-08-23 ENCOUNTER — Inpatient Hospital Stay: Admission: RE | Admit: 2023-08-23 | Source: Ambulatory Visit

## 2023-08-23 ENCOUNTER — Other Ambulatory Visit: Payer: Self-pay | Admitting: Internal Medicine

## 2023-08-23 DIAGNOSIS — M7989 Other specified soft tissue disorders: Secondary | ICD-10-CM

## 2023-08-28 ENCOUNTER — Ambulatory Visit
Admission: RE | Admit: 2023-08-28 | Discharge: 2023-08-28 | Disposition: A | Discharge status: Home / Self Care | Source: Ambulatory Visit | Attending: Internal Medicine | Admitting: Internal Medicine

## 2023-08-28 DIAGNOSIS — M7989 Other specified soft tissue disorders: Secondary | ICD-10-CM

## 2023-11-27 ENCOUNTER — Ambulatory Visit: Admitting: Internal Medicine

## 2024-01-23 ENCOUNTER — Other Ambulatory Visit: Payer: Self-pay | Admitting: Family Medicine

## 2024-01-23 ENCOUNTER — Telehealth: Payer: Self-pay

## 2024-01-23 ENCOUNTER — Encounter: Payer: Self-pay | Admitting: Family Medicine

## 2024-01-23 DIAGNOSIS — I5030 Unspecified diastolic (congestive) heart failure: Secondary | ICD-10-CM

## 2024-01-23 NOTE — Telephone Encounter (Signed)
 Received a faxed ECHO order from [facility] - Dr Edra Odell Chard Printed, copied and given to Doyal HERO

## 2024-02-13 ENCOUNTER — Other Ambulatory Visit: Payer: Self-pay | Admitting: Family Medicine

## 2024-02-13 DIAGNOSIS — N62 Hypertrophy of breast: Secondary | ICD-10-CM

## 2024-02-14 ENCOUNTER — Ambulatory Visit: Admission: RE | Admit: 2024-02-14 | Source: Ambulatory Visit

## 2024-02-14 ENCOUNTER — Ambulatory Visit
Admission: RE | Admit: 2024-02-14 | Discharge: 2024-02-14 | Disposition: A | Source: Ambulatory Visit | Attending: Family Medicine | Admitting: Family Medicine

## 2024-02-14 DIAGNOSIS — N62 Hypertrophy of breast: Secondary | ICD-10-CM

## 2024-03-17 ENCOUNTER — Ambulatory Visit

## 2024-03-17 DIAGNOSIS — I5030 Unspecified diastolic (congestive) heart failure: Secondary | ICD-10-CM

## 2024-03-17 LAB — ECHOCARDIOGRAM COMPLETE
AR max vel: 1.59 cm2
AV Area VTI: 1.49 cm2
AV Area mean vel: 1.47 cm2
AV Mean grad: 4 mmHg
AV Peak grad: 8 mmHg
Ao pk vel: 1.41 m/s
Area-P 1/2: 4.41 cm2
S' Lateral: 2.7 cm

## 2024-03-17 MED ORDER — PERFLUTREN LIPID MICROSPHERE
1.0000 mL | INTRAVENOUS | Status: AC | PRN
  Administered 2024-03-17: 2 mL via INTRAVENOUS
